# Patient Record
Sex: Female | Born: 1940 | Race: White | Hispanic: No | Marital: Married | Smoking: Former smoker
Health system: Southern US, Community
[De-identification: ages and names within clinical notes are randomized; demographics above are authoritative.]

## PROBLEM LIST (undated history)

## (undated) DIAGNOSIS — H3530 Unspecified macular degeneration: Secondary | ICD-10-CM

## (undated) DIAGNOSIS — Z853 Personal history of malignant neoplasm of breast: Secondary | ICD-10-CM

## (undated) DIAGNOSIS — E039 Hypothyroidism, unspecified: Secondary | ICD-10-CM

## (undated) DIAGNOSIS — I341 Nonrheumatic mitral (valve) prolapse: Secondary | ICD-10-CM

## (undated) DIAGNOSIS — N189 Chronic kidney disease, unspecified: Secondary | ICD-10-CM

## (undated) DIAGNOSIS — J449 Chronic obstructive pulmonary disease, unspecified: Secondary | ICD-10-CM

## (undated) DIAGNOSIS — I10 Essential (primary) hypertension: Secondary | ICD-10-CM

## (undated) DIAGNOSIS — R112 Nausea with vomiting, unspecified: Secondary | ICD-10-CM

## (undated) DIAGNOSIS — M1990 Unspecified osteoarthritis, unspecified site: Secondary | ICD-10-CM

## (undated) DIAGNOSIS — M419 Scoliosis, unspecified: Secondary | ICD-10-CM

## (undated) DIAGNOSIS — M8580 Other specified disorders of bone density and structure, unspecified site: Secondary | ICD-10-CM

## (undated) DIAGNOSIS — F319 Bipolar disorder, unspecified: Secondary | ICD-10-CM

## (undated) DIAGNOSIS — Z9889 Other specified postprocedural states: Secondary | ICD-10-CM

## (undated) DIAGNOSIS — C50919 Malignant neoplasm of unspecified site of unspecified female breast: Secondary | ICD-10-CM

## (undated) DIAGNOSIS — F419 Anxiety disorder, unspecified: Secondary | ICD-10-CM

## (undated) DIAGNOSIS — M5430 Sciatica, unspecified side: Secondary | ICD-10-CM

## (undated) DIAGNOSIS — J301 Allergic rhinitis due to pollen: Secondary | ICD-10-CM

## (undated) DIAGNOSIS — S42301A Unspecified fracture of shaft of humerus, right arm, initial encounter for closed fracture: Secondary | ICD-10-CM

## (undated) HISTORY — PX: OTHER SURGICAL HISTORY: SHX169

## (undated) HISTORY — DX: Malignant neoplasm of unspecified site of unspecified female breast: C50.919

## (undated) HISTORY — PX: HERNIA REPAIR: SHX51

## (undated) HISTORY — PX: DILATION AND CURETTAGE OF UTERUS: SHX78

## (undated) HISTORY — PX: CATARACT EXTRACTION: SUR2

## (undated) HISTORY — PX: BREAST LUMPECTOMY: SHX2

## (undated) HISTORY — PX: LAPAROTOMY: SHX154

## (undated) HISTORY — PX: COLONOSCOPY: SHX174

## (undated) HISTORY — PX: BREAST BIOPSY: SHX20

## (undated) HISTORY — PX: TONSILLECTOMY: SUR1361

## (undated) HISTORY — PX: WISDOM TOOTH EXTRACTION: SHX21

## (undated) HISTORY — DX: Bipolar disorder, unspecified: F31.9

## (undated) HISTORY — PX: UPPER GI ENDOSCOPY: SHX6162

## (undated) HISTORY — DX: Essential (primary) hypertension: I10

## (undated) HISTORY — PX: TUBAL LIGATION: SHX77

## (undated) HISTORY — PX: EYE SURGERY: SHX253

## (undated) HISTORY — PX: UNILATERAL SALPINGECTOMY: SHX6160

## (undated) HISTORY — PX: APPENDECTOMY: SHX54

---
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
## 1999-02-25 ENCOUNTER — Encounter: Payer: Self-pay | Admitting: Orthopedic Surgery

## 1999-02-25 ENCOUNTER — Encounter: Payer: Self-pay | Admitting: Emergency Medicine

## 1999-02-25 ENCOUNTER — Emergency Department (HOSPITAL_COMMUNITY): Admission: EM | Admit: 1999-02-25 | Discharge: 1999-02-25 | Payer: Self-pay | Admitting: Emergency Medicine

## 1999-08-10 ENCOUNTER — Ambulatory Visit (HOSPITAL_COMMUNITY): Admission: RE | Admit: 1999-08-10 | Discharge: 1999-08-10 | Payer: Self-pay | Admitting: Orthopedic Surgery

## 1999-08-10 ENCOUNTER — Encounter: Payer: Self-pay | Admitting: Orthopedic Surgery

## 1999-09-11 ENCOUNTER — Encounter: Payer: Self-pay | Admitting: Orthopedic Surgery

## 1999-09-18 ENCOUNTER — Inpatient Hospital Stay (HOSPITAL_COMMUNITY): Admission: RE | Admit: 1999-09-18 | Discharge: 1999-09-22 | Payer: Self-pay | Admitting: Orthopedic Surgery

## 1999-09-18 ENCOUNTER — Encounter: Payer: Self-pay | Admitting: Orthopedic Surgery

## 1999-09-18 ENCOUNTER — Encounter (INDEPENDENT_AMBULATORY_CARE_PROVIDER_SITE_OTHER): Payer: Self-pay | Admitting: Specialist

## 2000-04-07 ENCOUNTER — Other Ambulatory Visit: Admission: RE | Admit: 2000-04-07 | Discharge: 2000-04-07 | Payer: Self-pay | Admitting: Obstetrics and Gynecology

## 2001-01-20 ENCOUNTER — Ambulatory Visit (HOSPITAL_COMMUNITY): Admission: RE | Admit: 2001-01-20 | Discharge: 2001-01-20 | Payer: Self-pay | Admitting: Obstetrics and Gynecology

## 2001-01-20 ENCOUNTER — Encounter (INDEPENDENT_AMBULATORY_CARE_PROVIDER_SITE_OTHER): Payer: Self-pay | Admitting: Specialist

## 2002-07-15 ENCOUNTER — Other Ambulatory Visit: Admission: RE | Admit: 2002-07-15 | Discharge: 2002-07-15 | Payer: Self-pay | Admitting: Obstetrics and Gynecology

## 2002-09-10 ENCOUNTER — Encounter: Payer: Self-pay | Admitting: Obstetrics and Gynecology

## 2002-09-10 ENCOUNTER — Encounter: Admission: RE | Admit: 2002-09-10 | Discharge: 2002-09-10 | Payer: Self-pay | Admitting: Obstetrics and Gynecology

## 2003-09-12 ENCOUNTER — Encounter: Payer: Self-pay | Admitting: Obstetrics and Gynecology

## 2003-09-12 ENCOUNTER — Encounter: Admission: RE | Admit: 2003-09-12 | Discharge: 2003-09-12 | Payer: Self-pay | Admitting: Obstetrics and Gynecology

## 2003-09-14 ENCOUNTER — Other Ambulatory Visit: Admission: RE | Admit: 2003-09-14 | Discharge: 2003-09-14 | Payer: Self-pay | Admitting: Obstetrics and Gynecology

## 2004-09-20 ENCOUNTER — Encounter: Admission: RE | Admit: 2004-09-20 | Discharge: 2004-09-20 | Payer: Self-pay | Admitting: Obstetrics and Gynecology

## 2005-03-27 ENCOUNTER — Ambulatory Visit: Payer: Self-pay | Admitting: Gastroenterology

## 2005-05-03 ENCOUNTER — Ambulatory Visit: Payer: Self-pay | Admitting: Gastroenterology

## 2005-10-04 ENCOUNTER — Encounter: Admission: RE | Admit: 2005-10-04 | Discharge: 2005-10-04 | Payer: Self-pay | Admitting: Internal Medicine

## 2006-01-08 ENCOUNTER — Other Ambulatory Visit: Admission: RE | Admit: 2006-01-08 | Discharge: 2006-01-08 | Payer: Self-pay | Admitting: Obstetrics and Gynecology

## 2006-10-29 ENCOUNTER — Encounter: Admission: RE | Admit: 2006-10-29 | Discharge: 2006-10-29 | Payer: Self-pay | Admitting: Obstetrics and Gynecology

## 2007-06-24 ENCOUNTER — Encounter: Admission: RE | Admit: 2007-06-24 | Discharge: 2007-06-24 | Payer: Self-pay | Admitting: General Surgery

## 2007-06-30 ENCOUNTER — Ambulatory Visit (HOSPITAL_BASED_OUTPATIENT_CLINIC_OR_DEPARTMENT_OTHER): Admission: RE | Admit: 2007-06-30 | Discharge: 2007-06-30 | Payer: Self-pay | Admitting: General Surgery

## 2008-01-21 ENCOUNTER — Encounter: Admission: RE | Admit: 2008-01-21 | Discharge: 2008-01-21 | Payer: Self-pay | Admitting: Obstetrics and Gynecology

## 2009-02-01 ENCOUNTER — Encounter: Admission: RE | Admit: 2009-02-01 | Discharge: 2009-02-01 | Payer: Self-pay | Admitting: Obstetrics and Gynecology

## 2010-03-21 ENCOUNTER — Encounter: Admission: RE | Admit: 2010-03-21 | Discharge: 2010-03-21 | Payer: Self-pay | Admitting: Obstetrics and Gynecology

## 2010-03-23 ENCOUNTER — Encounter: Admission: RE | Admit: 2010-03-23 | Discharge: 2010-03-23 | Payer: Self-pay | Admitting: Obstetrics and Gynecology

## 2010-03-23 DIAGNOSIS — C50919 Malignant neoplasm of unspecified site of unspecified female breast: Secondary | ICD-10-CM

## 2010-03-23 HISTORY — DX: Malignant neoplasm of unspecified site of unspecified female breast: C50.919

## 2010-03-27 DIAGNOSIS — C50919 Malignant neoplasm of unspecified site of unspecified female breast: Secondary | ICD-10-CM | POA: Insufficient documentation

## 2010-04-02 ENCOUNTER — Encounter: Admission: RE | Admit: 2010-04-02 | Discharge: 2010-04-02 | Payer: Self-pay | Admitting: Obstetrics and Gynecology

## 2010-04-09 ENCOUNTER — Encounter: Admission: RE | Admit: 2010-04-09 | Discharge: 2010-04-09 | Payer: Self-pay | Admitting: Surgery

## 2010-04-11 ENCOUNTER — Ambulatory Visit (HOSPITAL_BASED_OUTPATIENT_CLINIC_OR_DEPARTMENT_OTHER): Admission: RE | Admit: 2010-04-11 | Discharge: 2010-04-11 | Payer: Self-pay | Admitting: Surgery

## 2010-04-11 ENCOUNTER — Encounter: Admission: RE | Admit: 2010-04-11 | Discharge: 2010-04-11 | Payer: Self-pay | Admitting: Surgery

## 2010-04-18 ENCOUNTER — Ambulatory Visit: Payer: Self-pay | Admitting: Oncology

## 2010-05-02 LAB — COMPREHENSIVE METABOLIC PANEL
AST: 29 U/L (ref 0–37)
CO2: 26 mEq/L (ref 19–32)
Creatinine, Ser: 1.61 mg/dL — ABNORMAL HIGH (ref 0.40–1.20)
Potassium: 4.5 mEq/L (ref 3.5–5.3)
Total Protein: 6.7 g/dL (ref 6.0–8.3)

## 2010-05-02 LAB — CBC WITH DIFFERENTIAL/PLATELET
BASO%: 0.6 % (ref 0.0–2.0)
EOS%: 3 % (ref 0.0–7.0)
Eosinophils Absolute: 0.2 10*3/uL (ref 0.0–0.5)
HCT: 38.8 % (ref 34.8–46.6)
LYMPH%: 28 % (ref 14.0–49.7)
MONO#: 0.8 10*3/uL (ref 0.1–0.9)
NEUT%: 55.5 % (ref 38.4–76.8)
RBC: 4.32 10*6/uL (ref 3.70–5.45)
RDW: 13.3 % (ref 11.2–14.5)
lymph#: 1.7 10*3/uL (ref 0.9–3.3)

## 2010-05-03 ENCOUNTER — Ambulatory Visit: Admission: RE | Admit: 2010-05-03 | Discharge: 2010-06-18 | Payer: Self-pay | Admitting: Radiation Oncology

## 2010-07-02 ENCOUNTER — Ambulatory Visit: Payer: Self-pay | Admitting: Oncology

## 2010-07-04 LAB — CBC WITH DIFFERENTIAL/PLATELET
BASO%: 0.8 % (ref 0.0–2.0)
EOS%: 3.2 % (ref 0.0–7.0)
HCT: 35.8 % (ref 34.8–46.6)
MCH: 30.6 pg (ref 25.1–34.0)
MCV: 90.2 fL (ref 79.5–101.0)
MONO%: 16.8 % — ABNORMAL HIGH (ref 0.0–14.0)
NEUT#: 2.5 10*3/uL (ref 1.5–6.5)
NEUT%: 55.5 % (ref 38.4–76.8)
Platelets: 208 10*3/uL (ref 145–400)
WBC: 4.6 10*3/uL (ref 3.9–10.3)
lymph#: 1.1 10*3/uL (ref 0.9–3.3)

## 2010-07-04 LAB — COMPREHENSIVE METABOLIC PANEL
ALT: 28 U/L (ref 0–35)
AST: 32 U/L (ref 0–37)
Albumin: 4.2 g/dL (ref 3.5–5.2)
BUN: 30 mg/dL — ABNORMAL HIGH (ref 6–23)
Chloride: 107 mEq/L (ref 96–112)
Creatinine, Ser: 1.59 mg/dL — ABNORMAL HIGH (ref 0.40–1.20)
Potassium: 4.6 mEq/L (ref 3.5–5.3)
Sodium: 142 mEq/L (ref 135–145)
Total Bilirubin: 0.4 mg/dL (ref 0.3–1.2)
Total Protein: 6.8 g/dL (ref 6.0–8.3)

## 2010-10-11 ENCOUNTER — Ambulatory Visit: Payer: Self-pay | Admitting: Oncology

## 2010-10-15 LAB — CBC WITH DIFFERENTIAL/PLATELET
Basophils Absolute: 0 10*3/uL (ref 0.0–0.1)
EOS%: 2.8 % (ref 0.0–7.0)
HCT: 37.8 % (ref 34.8–46.6)
HGB: 12.4 g/dL (ref 11.6–15.9)
LYMPH%: 20.7 % (ref 14.0–49.7)
MCH: 29.7 pg (ref 25.1–34.0)
MCHC: 32.9 g/dL (ref 31.5–36.0)
MONO#: 0.7 10*3/uL (ref 0.1–0.9)
MONO%: 12.5 % (ref 0.0–14.0)
NEUT%: 63.4 % (ref 38.4–76.8)
Platelets: 194 10*3/uL (ref 145–400)
RDW: 12.9 % (ref 11.2–14.5)

## 2010-10-15 LAB — COMPREHENSIVE METABOLIC PANEL
ALT: 20 U/L (ref 0–35)
CO2: 25 mEq/L (ref 19–32)
Creatinine, Ser: 1.58 mg/dL — ABNORMAL HIGH (ref 0.40–1.20)
Glucose, Bld: 90 mg/dL (ref 70–99)
Total Protein: 6.5 g/dL (ref 6.0–8.3)

## 2010-12-19 DIAGNOSIS — M545 Low back pain, unspecified: Secondary | ICD-10-CM | POA: Insufficient documentation

## 2011-01-08 ENCOUNTER — Ambulatory Visit: Payer: Self-pay | Admitting: Oncology

## 2011-01-10 LAB — CBC WITH DIFFERENTIAL/PLATELET
BASO%: 0.5 % (ref 0.0–2.0)
Basophils Absolute: 0 10*3/uL (ref 0.0–0.1)
EOS%: 3 % (ref 0.0–7.0)
Eosinophils Absolute: 0.1 10*3/uL (ref 0.0–0.5)
HCT: 37.7 % (ref 34.8–46.6)
HGB: 12.9 g/dL (ref 11.6–15.9)
LYMPH%: 26.5 % (ref 14.0–49.7)
MCH: 30.9 pg (ref 25.1–34.0)
MCHC: 34.3 g/dL (ref 31.5–36.0)
MCV: 90.2 fL (ref 79.5–101.0)
MONO#: 0.7 10*3/uL (ref 0.1–0.9)
MONO%: 13.5 % (ref 0.0–14.0)
NEUT#: 2.8 10*3/uL (ref 1.5–6.5)
NEUT%: 56.5 % (ref 38.4–76.8)
Platelets: 202 10*3/uL (ref 145–400)
RBC: 4.18 10*6/uL (ref 3.70–5.45)
RDW: 13 % (ref 11.2–14.5)
WBC: 5 10*3/uL (ref 3.9–10.3)
lymph#: 1.3 10*3/uL (ref 0.9–3.3)

## 2011-01-10 LAB — COMPREHENSIVE METABOLIC PANEL
ALT: 22 U/L (ref 0–35)
AST: 24 U/L (ref 0–37)
Albumin: 4 g/dL (ref 3.5–5.2)
Alkaline Phosphatase: 48 U/L (ref 39–117)
BUN: 30 mg/dL — ABNORMAL HIGH (ref 6–23)
CO2: 26 mEq/L (ref 19–32)
Calcium: 8.9 mg/dL (ref 8.4–10.5)
Chloride: 106 mEq/L (ref 96–112)
Creatinine, Ser: 1.71 mg/dL — ABNORMAL HIGH (ref 0.40–1.20)
Glucose, Bld: 89 mg/dL (ref 70–99)
Potassium: 4.4 mEq/L (ref 3.5–5.3)
Sodium: 143 mEq/L (ref 135–145)
Total Bilirubin: 0.5 mg/dL (ref 0.3–1.2)
Total Protein: 6.3 g/dL (ref 6.0–8.3)

## 2011-02-22 ENCOUNTER — Other Ambulatory Visit: Payer: Self-pay | Admitting: Obstetrics and Gynecology

## 2011-02-22 DIAGNOSIS — Z853 Personal history of malignant neoplasm of breast: Secondary | ICD-10-CM

## 2011-03-12 LAB — CBC
HCT: 39.3 % (ref 36.0–46.0)
MCHC: 34.5 g/dL (ref 30.0–36.0)
MCV: 90.3 fL (ref 78.0–100.0)
Platelets: 193 10*3/uL (ref 150–400)
RBC: 4.35 MIL/uL (ref 3.87–5.11)
WBC: 6.9 10*3/uL (ref 4.0–10.5)

## 2011-03-12 LAB — DIFFERENTIAL
Eosinophils Relative: 2 % (ref 0–5)
Lymphocytes Relative: 25 % (ref 12–46)
Lymphs Abs: 1.7 10*3/uL (ref 0.7–4.0)
Monocytes Relative: 11 % (ref 3–12)
Neutro Abs: 4.2 10*3/uL (ref 1.7–7.7)
Neutrophils Relative %: 61 % (ref 43–77)

## 2011-03-12 LAB — COMPREHENSIVE METABOLIC PANEL
ALT: 30 U/L (ref 0–35)
AST: 33 U/L (ref 0–37)
Alkaline Phosphatase: 52 U/L (ref 39–117)
Calcium: 9.2 mg/dL (ref 8.4–10.5)
Glucose, Bld: 99 mg/dL (ref 70–99)
Potassium: 5.4 mEq/L — ABNORMAL HIGH (ref 3.5–5.1)
Total Bilirubin: 0.8 mg/dL (ref 0.3–1.2)
Total Protein: 7.1 g/dL (ref 6.0–8.3)

## 2011-03-12 LAB — URINALYSIS, ROUTINE W REFLEX MICROSCOPIC
Nitrite: NEGATIVE
pH: 6 (ref 5.0–8.0)

## 2011-03-12 LAB — CANCER ANTIGEN 27.29: CA 27.29: 22 U/mL (ref 0–39)

## 2011-04-10 ENCOUNTER — Ambulatory Visit
Admission: RE | Admit: 2011-04-10 | Discharge: 2011-04-10 | Disposition: A | Discharge status: Home / Self Care | Payer: Medicare Other | Source: Ambulatory Visit | Attending: Obstetrics and Gynecology | Admitting: Obstetrics and Gynecology

## 2011-04-10 DIAGNOSIS — Z853 Personal history of malignant neoplasm of breast: Secondary | ICD-10-CM

## 2011-05-07 NOTE — Op Note (Signed)
Shelly [name]                 ACCOUNT NO.:  [id number]   MEDICAL RECORD NO.:  [id number]          PATIENT TYPE:  AMB   LOCATION:  [place]                          FACILITY:  [facility]   PHYSICIAN:  Odis Hollingshead, M.D.DATE OF BIRTH:  [birth year]   DATE OF PROCEDURE:  06/30/2007  DATE OF DISCHARGE:                               OPERATIVE REPORT   PREOPERATIVE DIAGNOSIS:  Right inguinal hernia.   POSTOPERATIVE DIAGNOSIS:  Right inguinal hernia.   PROCEDURE:  Right inguinal hernia repaired with mesh.   SURGEON:  Odis Hollingshead, M.D.   ANESTHESIA:  General plus Marcaine for local effect.   INDICATION:  Shelly [name] is a 70 year old female who has had a  Pfannenstiel incision in the past.  On the right lateral aspect, she  states she has noticed a bulge that is getting larger and sometimes  sore.  On examination, it looks like it could be an inguinal hernia or  possible a lateral incisional hernia, and now she is brought to the  operating room for repair.   TECHNIQUE:  She was seen in the holding area and the area marked with my  initials.  She was then brought to the operating room, placed supine on  the operating table and general anesthetic was administered.  A Foley  catheter was placed in the bladder.  The lower abdominal wall and pelvic  area were sterilely prepped and draped.  I began at the midportion of  her Pfannenstiel scar and made an incision extending this to the right  and dividing the skin and subcutaneous tissue.  I inspected the fascia  of the previous Pfannenstiel closure and appeared to be intact.  I  noticed a weakness in external oblique aponeurosis/fullness.  I  infiltrated with local anesthetic deep to the external oblique  aponeurosis and made an incision in it through the external ring  medially and toward the anterior superior iliac spine laterally.  I  isolated the round ligament and encircled it and noted an indirect  hernia sac with contents and an  enlarged internal ring.  I dissected the  indirect sac and contents free from the round ligament and replaced it  through the enlarged internal ring.  This was consistent with an  indirect inguinal hernia.   Following this, I brought a piece of 3 x 6 inch polypropylene mesh into  the field and anchored it 2 cm medial to the pubic tubercle with 2-0  Prolene suture.  The inferior aspect of the mesh was anchored the  shelving edge of the inguinal ligament with a running 2-0 Prolene suture  to a level 2 cm lateral to the internal ring.  A slit was cut in the  mesh, and the two tails were wrapped around the round ligament and the  ilioinguinal nerve.  The superior aspect of the mesh was anchored to the  internal oblique aponeurosis with interrupted 2-0 Vicryl sutures.  The  two tails were crossed creating a new internal ring.  These were then  anchored to the shelving edge of the inguinal ligament with a 2-0  Prolene suture.   Following this, I injected the internal oblique aponeurosis with  Marcaine solution.  I then injected the subcutaneous tissue with  Marcaine solution.  I tucked the lateral aspect of the mesh deep to the  external oblique aponeurosis and then closed it with running 3-0 Vicryl  suture over the mesh and round ligament.  The subcutaneous tissue was  closed with a running 3-0 Vicryl suture.  The skin was closed with a 4-0  Monocryl subcuticular stitch.  Steri-Strips and a sterile dressing were  applied.   She tolerated the procedure well without apparent complications and was  taken to the recovery in satisfactory condition.  She will be given  discharge instructions and Tylox for pain and follow-up in office in 2  to 3 weeks.      Odis Hollingshead, M.D.  Electronically Signed     TJR/MEDQ  D:  06/30/2007  T:  06/30/2007  Job:  [id number]   cc:   Sharyn Lull L. Helane Rima, M.D.  [name], MD

## 2011-05-10 NOTE — H&P (Signed)
[facility]  Patient:    Shelly [name], Shelly [name]                          MRN: [id number] Dictator:   Harrold Donath, M.D.                         History and Physical  HISTORY OF PRESENT ILLNESS:   This is a 70 year old female admitted to the hospital for a hysteroscopy, D&C, possible resection.  This patient is on hormone replacement therapy; she is on Vivelle 0.375 and Provera 1-10.  She has had some abnormal spotting and she had had marked breast tenderness.  We have reduced her from Premarin which she was taking to the [place] with some decrease in her breast tenderness, but she has some spotting which she has had with her last real regular period with hormones in April 2000.  Because of this, she had an ultrasound which showed her uterus to be 5.6 x 3.2 x 4.2 cm which was retroflexed.  She had a fundal myoma.  She also had a 15.6 mm thickness of the endometrium with an echogenic area within the cavity of 6 x 5 mm.  We injected sterile saline and outlined a 7 x 9 cm area at the left wall of the cavity which was hard to evaluate.  The patient had cramping back on the 10th and 11th day of her Provera.  She thinks she had a faint trace of blood in January, but mainly she had the cramping.  Her ovaries are 2.1 x 1.3 x 1.9 with a 3 mm echogenic area in the wall of the ovary; her left ovary is 2.2 x 1 x 1.5 with an echo-free cyst.  The patient because of this is admitted for surgical evaluation.  Her last Pap smear was April 2001, which was normal.  Last mammogram was April 2001.  MEDICATIONS:                  Synthroid 0.05 everyday, Wellbutrin 100 mg every day, Eskalith which is lithium 450 mg everyday and Xanax which she takes p.r.n. for sleep.  HABITS:                       Her caffeine is limited.  OTHER MEDICAL DOCTORS:        Her current doctors are Dr. Burna Sis, Dr. Maxwell Caul and Dr. Erling Cruz.  REVIEW OF SYSTEMS:            She has had problems with a  weight gain but not markedly.  She has problems with her vision, she has the eyes checked regularly, which may be medication.  She has no problems with her cardiovascular or lungs.  Gastrointestinal: She thinks her medicine makes her constipated.  She has had no other major problems.  There is no history of breast, colon or ovarian cancer in the family.  She has had kidney stones in the past.  She is currently being treated for depression and she has been anemic in the past.  She is also currently taking thyroid.  PHYSICAL EXAMINATION:  GENERAL:                      This is a well-developed, well-nourished female oriented and alert.  VITAL SIGNS:  Blood pressure is 105/58, weight is 134.  HEART:                        Normal sinus rhythm without gallops or murmurs.  LUNGS:                        Clear to auscultation and percussion.  BREASTS:                      Type IV without masses.  ABDOMEN:                      The liver is not enlarged, spleen is not enlarged.  VAGINAL EXAM:                 She has good support, she has some estrogen effect.  The cervix is epithelialized.  The uterus feels normal size.  I feel no enlargement of the ovaries.  IMPRESSION:                   1. Postmenopausal spotting.                               2. Postmenopausal cramping.                               3. Echogenic defect, endometrial cavity.                               4. Hormone replacement therapy.                               5. Mastodynia decreasing.                               6. On lithium, on thyroid and on Wellbutrin.  DISPOSITION:                  Admit for hysteroscopy and D&C. DD:  01/19/01 TD:  01/19/01 Job: [id number]

## 2011-06-06 ENCOUNTER — Other Ambulatory Visit: Payer: Self-pay | Admitting: Oncology

## 2011-06-06 ENCOUNTER — Encounter (HOSPITAL_BASED_OUTPATIENT_CLINIC_OR_DEPARTMENT_OTHER): Payer: Medicare Other | Admitting: Oncology

## 2011-06-06 DIAGNOSIS — C50419 Malignant neoplasm of upper-outer quadrant of unspecified female breast: Secondary | ICD-10-CM

## 2011-06-06 DIAGNOSIS — I10 Essential (primary) hypertension: Secondary | ICD-10-CM

## 2011-06-06 DIAGNOSIS — Z170 Estrogen receptor positive status [ER+]: Secondary | ICD-10-CM

## 2011-06-06 DIAGNOSIS — N189 Chronic kidney disease, unspecified: Secondary | ICD-10-CM

## 2011-06-06 LAB — CBC WITH DIFFERENTIAL/PLATELET
Basophils Absolute: 0.1 10*3/uL (ref 0.0–0.1)
EOS%: 2.6 % (ref 0.0–7.0)
HCT: 35.6 % (ref 34.8–46.6)
HGB: 11.9 g/dL (ref 11.6–15.9)
MCH: 30.3 pg (ref 25.1–34.0)
MONO#: 0.8 10*3/uL (ref 0.1–0.9)
NEUT#: 3.9 10*3/uL (ref 1.5–6.5)
NEUT%: 61.1 % (ref 38.4–76.8)
RDW: 13.1 % (ref 11.2–14.5)
WBC: 6.5 10*3/uL (ref 3.9–10.3)
lymph#: 1.5 10*3/uL (ref 0.9–3.3)

## 2011-06-06 LAB — COMPREHENSIVE METABOLIC PANEL
ALT: 27 U/L (ref 0–35)
Albumin: 3.9 g/dL (ref 3.5–5.2)
Alkaline Phosphatase: 53 U/L (ref 39–117)
Calcium: 8.9 mg/dL (ref 8.4–10.5)
Creatinine, Ser: 1.67 mg/dL — ABNORMAL HIGH (ref 0.50–1.10)
Potassium: 4.6 mEq/L (ref 3.5–5.3)
Total Protein: 6.2 g/dL (ref 6.0–8.3)

## 2011-06-11 ENCOUNTER — Other Ambulatory Visit: Payer: Self-pay | Admitting: Dermatology

## 2011-10-08 LAB — COMPREHENSIVE METABOLIC PANEL
ALT: 28
Alkaline Phosphatase: 33 — ABNORMAL LOW
BUN: 25 — ABNORMAL HIGH
CO2: 27
GFR calc non Af Amer: 34 — ABNORMAL LOW
Glucose, Bld: 83
Potassium: 4.3
Sodium: 139

## 2011-10-08 LAB — URINALYSIS, ROUTINE W REFLEX MICROSCOPIC
Nitrite: NEGATIVE
Protein, ur: NEGATIVE
Urobilinogen, UA: 0.2

## 2011-10-08 LAB — CBC
HCT: 36.6
Hemoglobin: 12.2
MCHC: 33.2
RBC: 4.01

## 2011-10-08 LAB — POCT HEMOGLOBIN-HEMACUE
Hemoglobin: 12.3
Operator id: 116011

## 2011-10-08 LAB — DIFFERENTIAL
Basophils Absolute: 0
Basophils Relative: 1
Eosinophils Absolute: 0.2
Neutro Abs: 4.7
Neutrophils Relative %: 62

## 2011-10-24 ENCOUNTER — Ambulatory Visit (HOSPITAL_BASED_OUTPATIENT_CLINIC_OR_DEPARTMENT_OTHER): Payer: Medicare Other | Admitting: Oncology

## 2011-10-24 ENCOUNTER — Other Ambulatory Visit: Payer: Self-pay | Admitting: Oncology

## 2011-10-24 ENCOUNTER — Telehealth: Payer: Self-pay | Admitting: Oncology

## 2011-10-24 DIAGNOSIS — Z853 Personal history of malignant neoplasm of breast: Secondary | ICD-10-CM

## 2011-10-24 DIAGNOSIS — Z170 Estrogen receptor positive status [ER+]: Secondary | ICD-10-CM

## 2011-10-24 DIAGNOSIS — I10 Essential (primary) hypertension: Secondary | ICD-10-CM

## 2011-10-24 DIAGNOSIS — C50419 Malignant neoplasm of upper-outer quadrant of unspecified female breast: Secondary | ICD-10-CM

## 2011-10-24 DIAGNOSIS — C50919 Malignant neoplasm of unspecified site of unspecified female breast: Secondary | ICD-10-CM

## 2011-10-24 DIAGNOSIS — N189 Chronic kidney disease, unspecified: Secondary | ICD-10-CM

## 2011-10-24 LAB — CBC WITH DIFFERENTIAL/PLATELET
Eosinophils Absolute: 0.2 10*3/uL (ref 0.0–0.5)
MONO#: 0.7 10*3/uL (ref 0.1–0.9)
MONO%: 14.7 % — ABNORMAL HIGH (ref 0.0–14.0)
NEUT#: 2.5 10*3/uL (ref 1.5–6.5)
RBC: 4.06 10*6/uL (ref 3.70–5.45)
RDW: 13.1 % (ref 11.2–14.5)
WBC: 4.6 10*3/uL (ref 3.9–10.3)
lymph#: 1.2 10*3/uL (ref 0.9–3.3)

## 2011-10-24 LAB — COMPREHENSIVE METABOLIC PANEL
Albumin: 3.9 g/dL (ref 3.5–5.2)
Alkaline Phosphatase: 48 U/L (ref 39–117)
Chloride: 107 mEq/L (ref 96–112)
Glucose, Bld: 80 mg/dL (ref 70–99)
Potassium: 4.6 mEq/L (ref 3.5–5.3)
Sodium: 143 mEq/L (ref 135–145)
Total Protein: 6.3 g/dL (ref 6.0–8.3)

## 2011-10-24 NOTE — Telephone Encounter (Signed)
gv pt appt schedule for April. Per 11/1 pof lb/HH 31mos.

## 2012-02-10 ENCOUNTER — Other Ambulatory Visit: Payer: Self-pay | Admitting: Oncology

## 2012-02-20 DIAGNOSIS — H35371 Puckering of macula, right eye: Secondary | ICD-10-CM | POA: Insufficient documentation

## 2012-02-20 DIAGNOSIS — H353132 Nonexudative age-related macular degeneration, bilateral, intermediate dry stage: Secondary | ICD-10-CM | POA: Insufficient documentation

## 2012-03-04 ENCOUNTER — Other Ambulatory Visit: Payer: Self-pay | Admitting: Oncology

## 2012-03-04 DIAGNOSIS — Z853 Personal history of malignant neoplasm of breast: Secondary | ICD-10-CM

## 2012-03-15 ENCOUNTER — Encounter: Payer: Self-pay | Admitting: Oncology

## 2012-03-26 ENCOUNTER — Other Ambulatory Visit (HOSPITAL_BASED_OUTPATIENT_CLINIC_OR_DEPARTMENT_OTHER): Payer: Medicare Other | Admitting: Lab

## 2012-03-26 ENCOUNTER — Ambulatory Visit (HOSPITAL_BASED_OUTPATIENT_CLINIC_OR_DEPARTMENT_OTHER): Payer: Medicare Other | Admitting: Oncology

## 2012-03-26 ENCOUNTER — Telehealth: Payer: Self-pay | Admitting: Oncology

## 2012-03-26 VITALS — BP 137/76 | HR 78 | Temp 97.0°F | Ht 64.5 in | Wt 114.7 lb

## 2012-03-26 DIAGNOSIS — M810 Age-related osteoporosis without current pathological fracture: Secondary | ICD-10-CM

## 2012-03-26 DIAGNOSIS — M899 Disorder of bone, unspecified: Secondary | ICD-10-CM

## 2012-03-26 DIAGNOSIS — C50919 Malignant neoplasm of unspecified site of unspecified female breast: Secondary | ICD-10-CM

## 2012-03-26 DIAGNOSIS — C50419 Malignant neoplasm of upper-outer quadrant of unspecified female breast: Secondary | ICD-10-CM

## 2012-03-26 DIAGNOSIS — Z170 Estrogen receptor positive status [ER+]: Secondary | ICD-10-CM

## 2012-03-26 LAB — CBC WITH DIFFERENTIAL/PLATELET
EOS%: 5.2 % (ref 0.0–7.0)
Eosinophils Absolute: 0.2 10*3/uL (ref 0.0–0.5)
MCH: 30 pg (ref 25.1–34.0)
MCV: 92.1 fL (ref 79.5–101.0)
MONO%: 14.3 % — ABNORMAL HIGH (ref 0.0–14.0)
NEUT#: 2.4 10*3/uL (ref 1.5–6.5)
RBC: 4.01 10*6/uL (ref 3.70–5.45)
RDW: 13.3 % (ref 11.2–14.5)
nRBC: 0 % (ref 0–0)

## 2012-03-26 LAB — COMPREHENSIVE METABOLIC PANEL
ALT: 26 U/L (ref 0–35)
AST: 29 U/L (ref 0–37)
Alkaline Phosphatase: 44 U/L (ref 39–117)
Sodium: 141 mEq/L (ref 135–145)
Total Bilirubin: 0.4 mg/dL (ref 0.3–1.2)
Total Protein: 6.4 g/dL (ref 6.0–8.3)

## 2012-03-26 NOTE — Progress Notes (Signed)
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  She is status post left breast upper outer quadrant lumpectomy on April 11, 2010 without evidence of angiolymphatic invasion.  There was no necrosis.  The closest margin of the invasive component was 0.1cm to the posterior margin, which was pectoralis major.  ER 99%, PR 99%, Ki-67 3%, HER2/neu by CISH was negative with ratio of 1.00.  She is s/p adjuvant XRT.  CURRENT THERAPY:  started adjuvant tamoxifen in July 2011.  INTERVAL HISTORY: Shelly [name] 71 y.o. female returns for regular followup. She reported that she has been having history of pain for many years. She has been seeing a Restaurant manager, fast food.  Her left sided pain slightly improved. However she's been having worsening mid upper back pain between her shoulder blades. This pain is described as crampy, mild to moderate, resolved with over-the-counter a medication such as axial; radiates to left anterior ribs; does not associate with weakness or numbness in bilateral arms.  She still has mild hot flash with tamoxifen.  However she is able to tolerate this, and she does not want any medication for the hot flash.  She has not found any abnormal breast lesion on her self breast exam. She has mild fatigue; however, she is independent of all activities of daily living.  Patient denies headache, visual changes, confusion, drenching night sweats, palpable lymph node swelling, mucositis, odynophagia, dysphagia, nausea vomiting, jaundice, chest pain, palpitation, shortness of breath, dyspnea on exertion, productive cough, gum bleeding, epistaxis, hematemesis, hemoptysis, abdominal pain, abdominal swelling, early satiety, melena, hematochezia, hematuria, skin rash, spontaneous bleeding, joint swelling, heat or cold intolerance, bowel bladder incontinence, back pain, focal motor  weakness, paresthesia, depression, suicidal or homocidal ideation, feeling hopelessness.   Past Medical History  Diagnosis Date  . Hypertension   . Bipolar depression   . Migraine   . Breast cancer 03/2010    s/p lumpectomy; adjuvant radiation; on adjuvant homrnal therapy    No past surgical history on file.  Current Outpatient Prescriptions  Medication Sig Dispense Refill  . amLODipine (NORVASC) 2.5 MG tablet Take 2.5 mg by mouth daily.      Marland Kitchen buPROPion (WELLBUTRIN SR) 150 MG 12 hr tablet Take 150 mg by mouth daily.      . cholecalciferol (VITAMIN D) 1000 UNITS tablet Take 1,000 Units by mouth daily.      Marland Kitchen levothyroxine (SYNTHROID, LEVOTHROID) 75 MCG tablet Take 75 mcg by mouth daily.      . Multiple Vitamins-Minerals (MULTIVITAMIN WITH MINERALS) tablet Take 1 tablet by mouth daily.      . Multiple Vitamins-Minerals (PRESERVISION AREDS 2 PO) Take by mouth daily.      . naproxen (NAPROSYN) 250 MG tablet Take 250 mg by mouth as needed.      . tamoxifen (NOLVADEX) 20 MG tablet TAKE ONE TABLET EVERY DAY  90 tablet  2    ALLERGIES:  is allergic to penicillins.  REVIEW OF SYSTEMS:  The rest of the 14-point review of system was negative.   Filed Vitals:   03/26/12 1209  BP: 137/76  Pulse: 78  Temp: 97 F (36.1 C)   Wt Readings from Last 3 Encounters:  03/26/12 114 lb 11.2 oz (52.028 kg)   ECOG Performance status: 0-1  PHYSICAL EXAMINATION:   General:  Thin appearing woman in no acute distress.  Eyes:  no  scleral icterus.  ENT:  There were no oropharyngeal lesions.  Neck was without thyromegaly.  Lymphatics:  Negative cervical, supraclavicular or axillary adenopathy.  Respiratory: lungs were clear bilaterally without wheezing or crackles.  Cardiovascular:  Regular rate and rhythm, S1/S2, without murmur, rub or gallop.  There was no pedal edema.  GI:  abdomen was soft, flat, nontender, nondistended, without organomegaly.  Muscoloskeletal:  no spinal tenderness of palpation of  vertebral spines themselves.  There was mild tenderness to palpation of left anterior ribs and mid upper back.  Skin exam was without echymosis, petichae.  Neuro exam was nonfocal.  Patient was able to get on and off exam table without assistance.  Gait was normal.  Patient was alerted and oriented.  Attention was good.   Language was appropriate.  Mood was normal without depression.  Speech was not pressured.  Thought content was not tangential.  Bilateral breast exam was negative for breast mass,        LABORATORY/RADIOLOGY DATA:  Lab Results  Component Value Date   WBC 4.7 03/26/2012   HGB 12.1 03/26/2012   HCT 37.0 03/26/2012   PLT 182 03/26/2012   GLUCOSE 87 03/26/2012   ALKPHOS 44 03/26/2012   ALT 26 03/26/2012   AST 29 03/26/2012   NA 141 03/26/2012   K 4.8 03/26/2012   CL 107 03/26/2012   CREATININE 1.64* 03/26/2012   BUN 32* 03/26/2012   CO2 26 03/26/2012    ASSESSMENT AND PLAN:   1. Bone pain:  Most likely due to musculoskeletal. She has no history of the Joint disease. She came that she had workup with her PCP x-ray that was negative. I advised her to follow with her PCP and her chiropractor to modified the exercise program to improve this back pain. I advised her that if she has a significantly worsening back pain or weakness in the arms or paresthesia significant is no for further evaluation. At this time, I still have low clinical suspicion for bone metastases (normal calcium/alkphos). 2. History of breast cancer:  I discussed with Ms. Withington that she has no evidence of disease recurrence or metastatic disease. I advised her to continue adjuvant Tamoxifen.  She tolerates tamoxifen well with grade 1 hot flash which is not warrants dose modification. Her routine surveillance mammogram is due for 04/10/2012. 3. History of osteoporosis:  She does not tolerate calcium at all and that is why she is on tamoxifen as opposed to an AI.  She only takes calcium in her nutrition with a lot of dairy products.  I  recommended for her to get the next bone density scan in October 2013 to see if there is any change in her bone density score at that time.  4. Hypothyroidism:  She is on levothyroxine per PCP.  5. Depression/anxiety:  well controlled clinically today with buproprion per PCP.  6. HTN: well controlled on Norvasc per PCP.  7. Elevated Cr:  Unclear etiology:  Her Cr today is 1.6 which has been for at least 1 year.  There is no sign of fluid overload on exam.  I defer to her PCP to decide if she needs to be referred to Nephrology.   Follow up with me in about 5 months.    The length of time of the face-to-face encounter was 15 minutes. More than 50% of time was spent counseling and coordination of care.

## 2012-03-26 NOTE — Telephone Encounter (Signed)
gve the pt her oct 2013 appt calendar °

## 2012-04-13 ENCOUNTER — Ambulatory Visit
Admission: RE | Admit: 2012-04-13 | Discharge: 2012-04-13 | Disposition: A | Discharge status: Home / Self Care | Payer: Medicare Other | Source: Ambulatory Visit | Attending: Oncology | Admitting: Oncology

## 2012-04-13 DIAGNOSIS — Z853 Personal history of malignant neoplasm of breast: Secondary | ICD-10-CM

## 2012-04-27 ENCOUNTER — Other Ambulatory Visit: Payer: Self-pay | Admitting: Internal Medicine

## 2012-04-27 DIAGNOSIS — D259 Leiomyoma of uterus, unspecified: Secondary | ICD-10-CM

## 2012-04-28 ENCOUNTER — Ambulatory Visit
Admission: RE | Admit: 2012-04-28 | Discharge: 2012-04-28 | Disposition: A | Discharge status: Home / Self Care | Payer: Medicare Other | Source: Ambulatory Visit | Attending: Internal Medicine | Admitting: Internal Medicine

## 2012-04-28 DIAGNOSIS — D259 Leiomyoma of uterus, unspecified: Secondary | ICD-10-CM

## 2012-09-25 ENCOUNTER — Other Ambulatory Visit: Payer: Medicare Other | Admitting: Lab

## 2012-09-25 ENCOUNTER — Ambulatory Visit: Payer: Medicare Other | Admitting: Oncology

## 2012-09-29 ENCOUNTER — Other Ambulatory Visit (HOSPITAL_BASED_OUTPATIENT_CLINIC_OR_DEPARTMENT_OTHER): Payer: Medicare Other | Admitting: Lab

## 2012-09-29 ENCOUNTER — Encounter: Payer: Self-pay | Admitting: Oncology

## 2012-09-29 ENCOUNTER — Ambulatory Visit (HOSPITAL_BASED_OUTPATIENT_CLINIC_OR_DEPARTMENT_OTHER): Payer: Medicare Other | Admitting: Oncology

## 2012-09-29 ENCOUNTER — Telehealth: Payer: Self-pay | Admitting: Oncology

## 2012-09-29 VITALS — BP 133/70 | HR 74 | Temp 97.5°F | Resp 18 | Ht 64.5 in | Wt 114.9 lb

## 2012-09-29 DIAGNOSIS — Z853 Personal history of malignant neoplasm of breast: Secondary | ICD-10-CM

## 2012-09-29 DIAGNOSIS — C50919 Malignant neoplasm of unspecified site of unspecified female breast: Secondary | ICD-10-CM

## 2012-09-29 DIAGNOSIS — M810 Age-related osteoporosis without current pathological fracture: Secondary | ICD-10-CM

## 2012-09-29 DIAGNOSIS — M899 Disorder of bone, unspecified: Secondary | ICD-10-CM

## 2012-09-29 DIAGNOSIS — F341 Dysthymic disorder: Secondary | ICD-10-CM

## 2012-09-29 DIAGNOSIS — C50419 Malignant neoplasm of upper-outer quadrant of unspecified female breast: Secondary | ICD-10-CM

## 2012-09-29 HISTORY — DX: Personal history of malignant neoplasm of breast: Z85.3

## 2012-09-29 LAB — CBC WITH DIFFERENTIAL/PLATELET
BASO%: 0.4 % (ref 0.0–2.0)
LYMPH%: 19.9 % (ref 14.0–49.7)
MCHC: 32.9 g/dL (ref 31.5–36.0)
MCV: 91 fL (ref 79.5–101.0)
MONO%: 10.1 % (ref 0.0–14.0)
Platelets: 188 10*3/uL (ref 145–400)
RBC: 4.34 10*6/uL (ref 3.70–5.45)

## 2012-09-29 LAB — COMPREHENSIVE METABOLIC PANEL (CC13)
ALT: 24 U/L (ref 0–55)
Alkaline Phosphatase: 47 U/L (ref 40–150)
Creatinine: 1.6 mg/dL — ABNORMAL HIGH (ref 0.6–1.1)
Glucose: 85 mg/dl (ref 70–99)
Sodium: 142 mEq/L (ref 136–145)
Total Bilirubin: 0.4 mg/dL (ref 0.20–1.20)
Total Protein: 6.7 g/dL (ref 6.4–8.3)

## 2012-09-29 MED ORDER — TAMOXIFEN CITRATE 20 MG PO TABS: 20.0000 mg | ORAL_TABLET | Freq: Every day | ORAL | Status: DC

## 2012-09-29 NOTE — Progress Notes (Signed)
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  She is status post left breast upper outer quadrant lumpectomy on April 11, 2010 without evidence of angiolymphatic invasion.  There was no necrosis.  The closest margin of the invasive component was 0.1cm to the posterior margin, which was pectoralis major.  ER 99%, PR 99%, Ki-67 3%, HER2/neu by CISH was negative with ratio of 1.00.  She is s/p adjuvant XRT.  CURRENT THERAPY:  started adjuvant tamoxifen in July 2011.  INTERVAL HISTORY: Shelly [name] 71 y.o. female returns for regular followup. She reported that she has been having history of back pain for many years. She has been seeing a Restaurant manager, fast food.  Her left sided pain slightly improved. However she's been having worsening mid upper back pain between her shoulder blades. This pain is described as crampy, mild to moderate, resolved with over-the-counter a medication such as axial; radiates to left anterior ribs; does not associate with weakness or numbness in bilateral arms.  MRI was obtained and per her reports showed some thickening of her endometrium. She was seen by gynecology and had an ultrasound. A biopsy was performed without any malignancy identified. A follow-up ultrasound was performed at the end of September with decreased thickening of the lining. Biopsy was not repeated. She is due to follow-up in 6 months. She still has mild hot flash with tamoxifen.  However she is able to tolerate this, and she does not want any medication for the hot flash.  She has not found any abnormal breast lesion on her self breast exam. She has mild fatigue; however, she is independent of all activities of daily living.  Patient denies headache, visual changes, confusion, drenching night sweats, palpable lymph node swelling, mucositis, odynophagia, dysphagia, nausea vomiting,  jaundice, chest pain, palpitation, shortness of breath, dyspnea on exertion, productive cough, gum bleeding, epistaxis, hematemesis, hemoptysis, abdominal pain, abdominal swelling, early satiety, melena, hematochezia, hematuria, skin rash, spontaneous bleeding, joint swelling, heat or cold intolerance, bowel bladder incontinence, back pain, focal motor weakness, paresthesia, depression, suicidal or homocidal ideation, feeling hopelessness.   Past Medical History  Diagnosis Date  . Hypertension   . Bipolar depression   . Migraine   . Breast cancer 03/2010    s/p lumpectomy; adjuvant radiation; on adjuvant homrnal therapy    History reviewed. No pertinent past surgical history.  Current Outpatient Prescriptions  Medication Sig Dispense Refill  . amLODipine (NORVASC) 2.5 MG tablet Take 2.5 mg by mouth daily.      Marland Kitchen buPROPion (WELLBUTRIN SR) 150 MG 12 hr tablet Take 150 mg by mouth daily.      . cholecalciferol (VITAMIN D) 1000 UNITS tablet Take 1,000 Units by mouth daily.      Marland Kitchen docusate sodium (COLACE) 100 MG capsule Take 100 mg by mouth daily.      . Evening Primrose Oil 1000 MG CAPS Take by mouth.      . fish oil-omega-3 fatty acids 1000 MG capsule Take 1 g by mouth daily.      Marland Kitchen levothyroxine (SYNTHROID, LEVOTHROID) 75 MCG tablet Take 75 mcg by mouth daily.      . Multiple Vitamins-Minerals (MULTIVITAMIN WITH MINERALS) tablet Take 1 tablet by mouth daily.      . Multiple Vitamins-Minerals (PRESERVISION AREDS 2 PO) Take by mouth daily.      . naproxen (NAPROSYN) 250 MG tablet Take  250 mg by mouth as needed.      . prednisoLONE acetate (PRED FORTE) 1 % ophthalmic suspension 1 drop Daily.      . tamoxifen (NOLVADEX) 20 MG tablet Take 1 tablet (20 mg total) by mouth daily.  90 tablet  2    ALLERGIES:  is allergic to eggs or egg-derived products and penicillins.  REVIEW OF SYSTEMS:  The rest of the 14-point review of system was negative.   Filed Vitals:   09/29/12 1306  BP: 133/70    Pulse: 74  Temp: 97.5 F (36.4 C)  Resp: 18   Wt Readings from Last 3 Encounters:  09/29/12 114 lb 14.4 oz (52.118 kg)  03/26/12 114 lb 11.2 oz (52.028 kg)   ECOG Performance status: 0-1  PHYSICAL EXAMINATION:   General:  Thin appearing woman in no acute distress.  Eyes:  no scleral icterus.  ENT:  There were no oropharyngeal lesions.  Neck was without thyromegaly.  Lymphatics:  Negative cervical, supraclavicular or axillary adenopathy.  Respiratory: lungs were clear bilaterally without wheezing or crackles.  Cardiovascular:  Regular rate and rhythm, S1/S2, without murmur, rub or gallop.  There was no pedal edema.  GI:  abdomen was soft, flat, nontender, nondistended, without organomegaly.  Muscoloskeletal:  no spinal tenderness of palpation of vertebral spines themselves.  There was mild tenderness to palpation of left anterior ribs and mid upper back.  Skin exam was without echymosis, petichae.  Neuro exam was nonfocal.  Patient was able to get on and off exam table without assistance.  Gait was normal.  Patient was alerted and oriented.  Attention was good.   Language was appropriate.  Mood was normal without depression.  Speech was not pressured.  Thought content was not tangential.  Bilateral breast exam was negative for breast mass.    LABORATORY/RADIOLOGY DATA:  Lab Results  Component Value Date   WBC 8.0 09/29/2012   HGB 13.0 09/29/2012   HCT 39.5 09/29/2012   PLT 188 09/29/2012   GLUCOSE 85 09/29/2012   ALKPHOS 47 09/29/2012   ALT 24 09/29/2012   AST 25 09/29/2012   NA 142 09/29/2012   K 4.8 09/29/2012   CL 107 09/29/2012   CREATININE 1.6* 09/29/2012   BUN 28.0* 09/29/2012   CO2 23 09/29/2012    ASSESSMENT AND PLAN:   1. Bone pain:  Most likely due to musculoskeletal. She has no history of the Joint disease. She came that she had workup with her PCP x-ray that was negative. I advised her to follow with her PCP and her chiropractor to modified the exercise program to improve this  back pain. I advised her that if she has a significantly worsening back pain or weakness in the arms or paresthesia significant is no for further evaluation. At this time, I still have low clinical suspicion for bone metastases (normal calcium/alkphos). 2. History of breast cancer:  I discussed with Ms. Rabbani that she has no evidence of disease recurrence or metastatic disease. I advised her to continue adjuvant Tamoxifen.  She tolerates tamoxifen well with grade 1 hot flash which is not warrants dose modification. Her routine surveillance mammogram is due in April 2014 and I have scheduled this today. 3. History of osteoporosis:  She does not tolerate calcium at all and that is why she is on tamoxifen as opposed to an AI.  She only takes calcium in her nutrition with a lot of dairy products.  I recommended for her to get the next  bone density scan in October 2013 to see if there is any change in her bone density score at that time. She states she is due ot have this soon at her PCP office. 4. Hypothyroidism:  She is on levothyroxine per PCP.  5. Depression/anxiety:  well controlled clinically today with buproprion per PCP.  6. HTN: well controlled on Norvasc per PCP.  7. Elevated Cr:  Unclear etiology:  Her Cr today is 1.6 which has been for at least 1 year.  There is no sign of fluid overload on exam.  I defer to her PCP to decide if she needs to be referred to Nephrology.  8. Thickening of endometrium: Endometrium thickening is improved. Previous biopsy negative for malignancy. No bleeding noted. Plan to continue Tamoxifen at this time. 9. Follow up: In 5 months.    The length of time of the face-to-face encounter was 25 minutes. More than 50% of time was spent counseling and coordination of care.

## 2012-09-29 NOTE — Telephone Encounter (Signed)
Spoke with [facility] she schedule pts 1year mammo....gv and printed appt schedule for patient for April 2014

## 2013-03-25 ENCOUNTER — Telehealth: Payer: Self-pay | Admitting: Oncology

## 2013-04-16 ENCOUNTER — Other Ambulatory Visit: Payer: Medicare Other | Admitting: Lab

## 2013-04-16 ENCOUNTER — Ambulatory Visit: Payer: Medicare Other | Admitting: Oncology

## 2013-05-05 ENCOUNTER — Other Ambulatory Visit (HOSPITAL_BASED_OUTPATIENT_CLINIC_OR_DEPARTMENT_OTHER): Payer: Medicare Other | Admitting: Lab

## 2013-05-05 ENCOUNTER — Telehealth: Payer: Self-pay | Admitting: Oncology

## 2013-05-05 ENCOUNTER — Ambulatory Visit (HOSPITAL_BASED_OUTPATIENT_CLINIC_OR_DEPARTMENT_OTHER): Payer: Medicare Other | Admitting: Oncology

## 2013-05-05 VITALS — BP 136/62 | HR 80 | Temp 97.3°F | Resp 18 | Ht 64.5 in | Wt 116.7 lb

## 2013-05-05 DIAGNOSIS — Z170 Estrogen receptor positive status [ER+]: Secondary | ICD-10-CM

## 2013-05-05 DIAGNOSIS — C50419 Malignant neoplasm of upper-outer quadrant of unspecified female breast: Secondary | ICD-10-CM

## 2013-05-05 DIAGNOSIS — C50919 Malignant neoplasm of unspecified site of unspecified female breast: Secondary | ICD-10-CM

## 2013-05-05 DIAGNOSIS — C50912 Malignant neoplasm of unspecified site of left female breast: Secondary | ICD-10-CM

## 2013-05-05 DIAGNOSIS — E039 Hypothyroidism, unspecified: Secondary | ICD-10-CM

## 2013-05-05 DIAGNOSIS — M810 Age-related osteoporosis without current pathological fracture: Secondary | ICD-10-CM

## 2013-05-05 LAB — CBC WITH DIFFERENTIAL/PLATELET
Basophils Absolute: 0.1 10*3/uL (ref 0.0–0.1)
Eosinophils Absolute: 0.2 10*3/uL (ref 0.0–0.5)
HCT: 37.3 % (ref 34.8–46.6)
LYMPH%: 25 % (ref 14.0–49.7)
MCV: 90.9 fL (ref 79.5–101.0)
MONO%: 10.3 % (ref 0.0–14.0)
NEUT#: 4.7 10*3/uL (ref 1.5–6.5)
NEUT%: 60.8 % (ref 38.4–76.8)
Platelets: 182 10*3/uL (ref 145–400)
RBC: 4.1 10*6/uL (ref 3.70–5.45)

## 2013-05-05 LAB — COMPREHENSIVE METABOLIC PANEL (CC13)
Alkaline Phosphatase: 47 U/L (ref 40–150)
BUN: 29.8 mg/dL — ABNORMAL HIGH (ref 7.0–26.0)
CO2: 26 mEq/L (ref 22–29)
Creatinine: 1.6 mg/dL — ABNORMAL HIGH (ref 0.6–1.1)
Glucose: 92 mg/dl (ref 70–99)
Sodium: 141 mEq/L (ref 136–145)
Total Bilirubin: 0.42 mg/dL (ref 0.20–1.20)

## 2013-05-05 NOTE — Progress Notes (Signed)
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  She is status post left breast upper outer quadrant lumpectomy on April 11, 2010 without evidence of angiolymphatic invasion.  There was no necrosis.  The closest margin of the invasive component was 0.1cm to the posterior margin, which was pectoralis major.  ER 99%, PR 99%, Ki-67 3%, HER2/neu by CISH was negative with ratio of 1.00.  She is s/p adjuvant XRT.  CURRENT THERAPY:  started adjuvant tamoxifen in July 2011.  INTERVAL HISTORY: Shelly [name] 72 y.o. female returns for regular followup. She reported doing well.  She denied fever, anorexia, weight loss, fatigue, headache, visual changes, confusion, drenching night sweats, breast mass, palpable lymph node swelling, mucositis, odynophagia, dysphagia, nausea vomiting, jaundice, chest pain, palpitation, shortness of breath, dyspnea on exertion, productive cough, gum bleeding, epistaxis, hematemesis, hemoptysis, abdominal pain, abdominal swelling, early satiety, melena, hematochezia, hematuria, skin rash, spontaneous bleeding, joint swelling, joint pain, heat or cold intolerance, bowel bladder incontinence, back pain, focal motor weakness, paresthesia, depression.  She has intermittent bilateral calf cramping at night.  There was no swelling.     Past Medical History  Diagnosis Date  . Hypertension   . Bipolar depression   . Migraine   . Breast cancer 03/2010    s/p lumpectomy; adjuvant radiation; on adjuvant homrnal therapy    No past surgical history on file.  Current Outpatient Prescriptions  Medication Sig Dispense Refill  . amLODipine (NORVASC) 2.5 MG tablet Take 2.5 mg by mouth daily.      Marland Kitchen buPROPion (WELLBUTRIN SR) 150 MG 12 hr tablet Take 150 mg by mouth daily.      . cholecalciferol (VITAMIN D) 1000 UNITS tablet Take 1,000 Units by mouth daily.       . Evening Primrose Oil 1000 MG CAPS Take by mouth.      . fish oil-omega-3 fatty acids 1000 MG capsule Take 1 g by mouth daily.      Marland Kitchen levothyroxine (SYNTHROID, LEVOTHROID) 75 MCG tablet Take 75 mcg by mouth daily.      . Multiple Vitamins-Minerals (MULTIVITAMIN WITH MINERALS) tablet Take 1 tablet by mouth daily.      . Multiple Vitamins-Minerals (PRESERVISION AREDS 2 PO) Take by mouth daily.      . naproxen (NAPROSYN) 250 MG tablet Take 250 mg by mouth as needed.      . tamoxifen (NOLVADEX) 20 MG tablet Take 1 tablet (20 mg total) by mouth daily.  90 tablet  2   No current facility-administered medications for this visit.    ALLERGIES:  is allergic to eggs or egg-derived products and penicillins.  REVIEW OF SYSTEMS:  The rest of the 14-point review of system was negative.   Filed Vitals:   05/05/13 1439  BP: 136/62  Pulse: 80  Temp: 97.3 F (36.3 C)  Resp: 18   Wt Readings from Last 3 Encounters:  05/05/13 116 lb 11.2 oz (52.935 kg)  09/29/12 114 lb 14.4 oz (52.118 kg)  03/26/12 114 lb 11.2 oz (52.028 kg)   ECOG Performance status: 0-1  PHYSICAL EXAMINATION:   General:  Thin appearing woman in no acute distress.  Eyes:  no scleral icterus.  ENT:  There were no oropharyngeal lesions.  Neck was without thyromegaly.  Lymphatics:  Negative cervical, supraclavicular or axillary adenopathy.  Respiratory: lungs were clear bilaterally without wheezing or crackles.  Cardiovascular:  Regular rate and rhythm, S1/S2, without murmur, rub or gallop.  There was no pedal edema.  GI:  abdomen was soft, flat, nontender, nondistended, without organomegaly.  Muscoloskeletal:  no spinal tenderness of palpation of vertebral spines themselves.  There was mild tenderness to palpation of left anterior ribs and mid upper back.  Skin exam was without echymosis, petichae.  Neuro exam was nonfocal.  Patient was able to get on and off exam table without assistance.  Gait was normal.  Patient was alertand  oriented.  Attention was good.   Language was appropriate.  Mood was normal without depression.  Speech was not pressured.  Thought content was not tangential.  Bilateral breast exam was negative for breast mass,        LABORATORY/RADIOLOGY DATA:  Lab Results  Component Value Date   WBC 7.8 05/05/2013   HGB 12.3 05/05/2013   HCT 37.3 05/05/2013   PLT 182 05/05/2013   GLUCOSE 92 05/05/2013   ALKPHOS 47 05/05/2013   ALT 23 05/05/2013   AST 26 05/05/2013   NA 141 05/05/2013   K 4.7 05/05/2013   CL 107 05/05/2013   CREATININE 1.6* 05/05/2013   BUN 29.8* 05/05/2013   CO2 26 05/05/2013    ASSESSMENT AND PLAN:   1. Calf pain:  Intermittent, self resolved.  Most likely benign cramp.  There was no exam finding consistent with DVT or arterial insufficiency.  2. History of breast cancer:  I discussed with Shelly [name] that she has no evidence of disease recurrence or metastatic disease. I advised her to continue adjuvant Tamoxifen.  She continues to be in remission.  She is doing well on tamoxifen.  When she finishes her 5 years, we will discuss the pros and cons of continuing it for 5 additional years.  Her routine surveillance mammogram is due for later this month.  3. History of osteoporosis: per PCP.  She does not like to take calcium or medication for this condition.  4. Hypothyroidism:  She is on levothyroxine per PCP.  5. Depression/anxiety: on buproprion per PCP.  6. HTN: on Norvasc per PCP.  7. Elevated Cr:  Her Cr is stable.   I informed Shelly [name] that I am leaving the practice.  [facility] will arrange for her to see a new provider when she returns.    Follow up  in about 6 months.    The length of time of the face-to-face encounter was 15 minutes. More than 50% of time was spent counseling and coordination of care.

## 2013-05-05 NOTE — Telephone Encounter (Signed)
gv and printed appt sched and avs for pt for NOV °

## 2013-05-10 ENCOUNTER — Ambulatory Visit
Admission: RE | Admit: 2013-05-10 | Discharge: 2013-05-10 | Disposition: A | Discharge status: Home / Self Care | Payer: Medicare Other | Source: Ambulatory Visit | Attending: Oncology | Admitting: Oncology

## 2013-05-10 ENCOUNTER — Other Ambulatory Visit: Payer: Self-pay | Admitting: Internal Medicine

## 2013-05-10 DIAGNOSIS — C50919 Malignant neoplasm of unspecified site of unspecified female breast: Secondary | ICD-10-CM

## 2013-05-11 ENCOUNTER — Ambulatory Visit
Admission: RE | Admit: 2013-05-11 | Discharge: 2013-05-11 | Disposition: A | Discharge status: Home / Self Care | Payer: Medicare Other | Source: Ambulatory Visit | Attending: Internal Medicine | Admitting: Internal Medicine

## 2013-08-03 ENCOUNTER — Other Ambulatory Visit: Payer: Self-pay | Admitting: Oncology

## 2013-10-06 ENCOUNTER — Telehealth: Payer: Self-pay | Admitting: Hematology and Oncology

## 2013-10-06 NOTE — Telephone Encounter (Signed)
Pt called today to r/s 11/14 lb/fu. Moved f/u to NG and gv pt new appt for lb/NG 11/21 @ 1:30pm

## 2013-11-05 ENCOUNTER — Other Ambulatory Visit: Payer: Self-pay | Admitting: *Deleted

## 2013-11-05 ENCOUNTER — Other Ambulatory Visit: Payer: Medicare Other | Admitting: Lab

## 2013-11-05 ENCOUNTER — Ambulatory Visit: Payer: Medicare Other

## 2013-11-05 MED ORDER — TAMOXIFEN CITRATE 20 MG PO TABS: 20.0000 mg | ORAL_TABLET | Freq: Every day | ORAL | Status: DC

## 2013-11-11 ENCOUNTER — Telehealth: Payer: Self-pay | Admitting: Hematology and Oncology

## 2013-11-11 NOTE — Telephone Encounter (Signed)
Pt called and r/s appt for lab and MD from 11/21 to 11/28

## 2013-11-12 ENCOUNTER — Other Ambulatory Visit: Payer: Medicare Other | Admitting: Lab

## 2013-11-12 ENCOUNTER — Ambulatory Visit: Payer: Medicare Other | Admitting: Hematology and Oncology

## 2013-11-30 ENCOUNTER — Other Ambulatory Visit: Payer: Medicare Other | Admitting: Lab

## 2013-11-30 ENCOUNTER — Ambulatory Visit: Payer: Medicare Other | Admitting: Hematology and Oncology

## 2013-12-07 ENCOUNTER — Other Ambulatory Visit: Payer: Medicare Other | Admitting: Lab

## 2013-12-07 ENCOUNTER — Encounter (INDEPENDENT_AMBULATORY_CARE_PROVIDER_SITE_OTHER): Payer: Self-pay

## 2013-12-07 ENCOUNTER — Encounter: Payer: Self-pay | Admitting: Hematology and Oncology

## 2013-12-07 ENCOUNTER — Ambulatory Visit (HOSPITAL_BASED_OUTPATIENT_CLINIC_OR_DEPARTMENT_OTHER): Payer: Medicare Other | Admitting: Hematology and Oncology

## 2013-12-07 VITALS — BP 130/60 | HR 84 | Temp 98.2°F | Resp 20 | Ht 64.5 in | Wt 115.0 lb

## 2013-12-07 DIAGNOSIS — C50419 Malignant neoplasm of upper-outer quadrant of unspecified female breast: Secondary | ICD-10-CM

## 2013-12-07 DIAGNOSIS — M810 Age-related osteoporosis without current pathological fracture: Secondary | ICD-10-CM

## 2013-12-07 DIAGNOSIS — C50912 Malignant neoplasm of unspecified site of left female breast: Secondary | ICD-10-CM

## 2013-12-07 MED ORDER — ANASTROZOLE 1 MG PO TABS: 1.0000 mg | ORAL_TABLET | Freq: Every day | ORAL | Status: DC

## 2013-12-07 NOTE — Patient Instructions (Signed)

## 2013-12-07 NOTE — Progress Notes (Signed)
[place] OFFICE PROGRESS NOTE  Patient Care Team: [name], MD as PCP - General (Internal Medicine) Haywood Lasso, MD (General Surgery) Gunnar Bulla (Psychiatry) Marye Round, MD (Radiation Oncology) Elveria Royals, MD as Attending Physician (Obstetrics and Gynecology) Heath Lark, MD as Consulting Physician (Hematology and Oncology)  DIAGNOSIS: T1, N0, M0 ER positive left breast cancer status post lumpectomy, radiation therapy  SUMMARY OF ONCOLOGIC HISTORY: This is a pleasant 72 year old lady was diagnosed with breast cancer more than 3 years ago. The patient palpated a lump on the left breast. Biopsy confirmed invasive ductal carcinoma. On 04/11/2010 she underwent left breast lumpectomy we show no evidence of lymph node involvement. Margins were negative. Further testing reviewed he was ER 99% positive, PR 99% positive, Ki-67 3% and HER-2/neu negative. She received adjuvant radiation therapy followed by tamoxifen. She started on tamoxifen from July 2011.  INTERVAL HISTORY: Shelly [name] 72 y.o. female returns for further followup. The patient had extensive evaluation done recently due to discomfort in her pelvis region. Ultrasound showed no evidence of cancer. Biopsy was negative. She denies any postmenopausal bleeding. The patient was offered tamoxifen instead of aromatase inhibitor due to history of osteopenia and family history of osteoporosis. A few years ago, she also had fracture of her right arm. The patient takes calcium and vitamin D supplements. Last bone density was more than 6 months ago which show improvement of osteopenia. The patient denies any palpable breast mass, swelling of her lymph glands or nipple changes I have reviewed the past medical history, past surgical history, social history and family history with the patient and they are unchanged from previous note.  ALLERGIES:  is allergic to eggs or egg-derived products and  penicillins.  MEDICATIONS:  Current Outpatient Prescriptions  Medication Sig Dispense Refill  . amLODipine (NORVASC) 2.5 MG tablet Take 2.5 mg by mouth daily.      Marland Kitchen buPROPion (WELLBUTRIN SR) 150 MG 12 hr tablet Take 150 mg by mouth daily.      . cholecalciferol (VITAMIN D) 1000 UNITS tablet Take 1,000 Units by mouth daily.      . Evening Primrose Oil 1000 MG CAPS Take by mouth.      . fish oil-omega-3 fatty acids 1000 MG capsule Take 1 g by mouth daily.      . IBUPROFEN PO Take 150 mg by mouth daily.      Marland Kitchen levothyroxine (SYNTHROID, LEVOTHROID) 75 MCG tablet Take 75 mcg by mouth daily.      . Multiple Vitamins-Minerals (MULTIVITAMIN WITH MINERALS) tablet Take 1 tablet by mouth daily.      . Multiple Vitamins-Minerals (PRESERVISION AREDS 2 PO) Take by mouth daily.      . tamoxifen (NOLVADEX) 20 MG tablet Take 1 tablet (20 mg total) by mouth daily.  90 tablet  2  . anastrozole (ARIMIDEX) 1 MG tablet Take 1 tablet (1 mg total) by mouth daily.  90 tablet  3  . naproxen (NAPROSYN) 250 MG tablet Take 250 mg by mouth as needed.       No current facility-administered medications for this visit.    REVIEW OF SYSTEMS:   Constitutional: Denies fevers, chills or abnormal weight loss Eyes: Denies blurriness of vision Ears, nose, mouth, throat, and face: Denies mucositis or sore throat Respiratory: Denies cough, dyspnea or wheezes Cardiovascular: Denies palpitation, chest discomfort or lower extremity swelling Gastrointestinal:  Denies nausea, heartburn or change in bowel habits Skin: Denies abnormal skin rashes Lymphatics:  Denies new lymphadenopathy or easy bruising Neurological:Denies numbness, tingling or new weaknesses Behavioral/Psych: Mood is stable, no new changes  All other systems were reviewed with the patient and are negative.  PHYSICAL EXAMINATION: ECOG PERFORMANCE STATUS: 0 - Asymptomatic  Filed Vitals:   12/07/13 1353  BP: 130/60  Pulse: 84  Temp: 98.2 F (36.8 C)  Resp:  20   Filed Weights   12/07/13 1353  Weight: 115 lb (52.164 kg)    GENERAL:alert, no distress and comfortable. Patient looks thin SKIN: skin color, texture, turgor are normal, no rashes or significant lesions EYES: normal, Conjunctiva are pink and non-injected, sclera clear OROPHARYNX:no exudate, no erythema and lips, buccal mucosa, and tongue normal  NECK: supple, thyroid normal size, non-tender, without nodularity LYMPH:  no palpable lymphadenopathy in the cervical, axillary or inguinal LUNGS: clear to auscultation and percussion with normal breathing effort HEART: regular rate & rhythm and no murmurs and no lower extremity edema ABDOMEN:abdomen soft, non-tender and normal bowel sounds Musculoskeletal:no cyanosis of digits and no clubbing  NEURO: alert & oriented x 3 with fluent speech, no focal motor/sensory deficits Bilateral breast examination were performed. Normal breast exam on the right. On the left, well-healed lumpectomy scar with no palpable abnormalities LABORATORY DATA:  I have reviewed the data as listed    Component Value Date/Time   NA 141 05/05/2013 1430   NA 141 03/26/2012 1136   K 4.7 05/05/2013 1430   K 4.8 03/26/2012 1136   CL 107 05/05/2013 1430   CL 107 03/26/2012 1136   CO2 26 05/05/2013 1430   CO2 26 03/26/2012 1136   GLUCOSE 92 05/05/2013 1430   GLUCOSE 87 03/26/2012 1136   BUN 29.8* 05/05/2013 1430   BUN 32* 03/26/2012 1136   CREATININE 1.6* 05/05/2013 1430   CREATININE 1.64* 03/26/2012 1136   CALCIUM 8.7 05/05/2013 1430   CALCIUM 9.0 03/26/2012 1136   PROT 6.7 05/05/2013 1430   PROT 6.4 03/26/2012 1136   ALBUMIN 3.3* 05/05/2013 1430   ALBUMIN 4.0 03/26/2012 1136   AST 26 05/05/2013 1430   AST 29 03/26/2012 1136   ALT 23 05/05/2013 1430   ALT 26 03/26/2012 1136   ALKPHOS 47 05/05/2013 1430   ALKPHOS 44 03/26/2012 1136   BILITOT 0.42 05/05/2013 1430   BILITOT 0.4 03/26/2012 1136   GFRNONAA 37* 04/09/2010 1300   GFRAA  Value: 45        The eGFR has been calculated using the MDRD  equation. This calculation has not been validated in all clinical situations. eGFR's persistently <60 mL/min signify possible Chronic Kidney Disease.* 04/09/2010 1300    No results found for this basename: SPEP, UPEP,  kappa and lambda light chains    Lab Results  Component Value Date   WBC 7.8 05/05/2013   NEUTROABS 4.7 05/05/2013   HGB 12.3 05/05/2013   HCT 37.3 05/05/2013   MCV 90.9 05/05/2013   PLT 182 05/05/2013      Chemistry      Component Value Date/Time   NA 141 05/05/2013 1430   NA 141 03/26/2012 1136   K 4.7 05/05/2013 1430   K 4.8 03/26/2012 1136   CL 107 05/05/2013 1430   CL 107 03/26/2012 1136   CO2 26 05/05/2013 1430   CO2 26 03/26/2012 1136   BUN 29.8* 05/05/2013 1430   BUN 32* 03/26/2012 1136   CREATININE 1.6* 05/05/2013 1430   CREATININE 1.64* 03/26/2012 1136      Component Value Date/Time   CALCIUM 8.7  05/05/2013 1430   CALCIUM 9.0 03/26/2012 1136   ALKPHOS 47 05/05/2013 1430   ALKPHOS 44 03/26/2012 1136   AST 26 05/05/2013 1430   AST 29 03/26/2012 1136   ALT 23 05/05/2013 1430   ALT 26 03/26/2012 1136   BILITOT 0.42 05/05/2013 1430   BILITOT 0.4 03/26/2012 1136      ASSESSMENT & PLAN:  #1 left breast cancer The patient had completed almost 3-1/2 years of tamoxifen. We discussed about possible changing her to aromatase inhibitor especially of concern for possible uterine malignancy. I discussed with her the pros and cons of changing her to aromatase inhibitor including potential worsening osteoporosis. I gave her patient education handout and a prescription of aromatase inhibitor. Goal of adjuvant treatment would be for 5 years until July of 2016. Her next mammogram is due in May of 2015. #2 history of osteopenia/osteoporosis She continue calcium with vitamin D. Her next bone density will be due next year Orders Placed This Encounter  Procedures  . MM Digital Screening    Standing Status: Future     Number of Occurrences:      Standing Expiration Date: 02/06/2015    Order  Specific Question:  Reason for Exam (SYMPTOM  OR DIAGNOSIS REQUIRED)    Answer:  Hx left breast ca, r/o recurrence    Order Specific Question:  Preferred imaging location?    Answer:  [facility]   All questions were answered. The patient knows to call the clinic with any problems, questions or concerns. No barriers to learning was detected. I spent 25 minutes counseling the patient face to face. The total time spent in the appointment was 40 minutes and more than 50% was on counseling and review of test results     [facility], [place], MD 12/07/2013 2:27 PM

## 2013-12-09 ENCOUNTER — Telehealth: Payer: Self-pay | Admitting: Hematology and Oncology

## 2013-12-09 NOTE — Telephone Encounter (Signed)
, °

## 2014-05-17 ENCOUNTER — Ambulatory Visit
Admission: RE | Admit: 2014-05-17 | Discharge: 2014-05-17 | Disposition: A | Discharge status: Home / Self Care | Payer: Medicare Other | Source: Ambulatory Visit | Attending: Hematology and Oncology | Admitting: Hematology and Oncology

## 2014-05-17 DIAGNOSIS — C50912 Malignant neoplasm of unspecified site of left female breast: Secondary | ICD-10-CM

## 2014-06-09 ENCOUNTER — Encounter: Payer: Self-pay | Admitting: Hematology and Oncology

## 2014-06-09 ENCOUNTER — Ambulatory Visit (HOSPITAL_BASED_OUTPATIENT_CLINIC_OR_DEPARTMENT_OTHER): Payer: Medicare Other | Admitting: Hematology and Oncology

## 2014-06-09 ENCOUNTER — Other Ambulatory Visit: Payer: Self-pay | Admitting: Hematology and Oncology

## 2014-06-09 ENCOUNTER — Other Ambulatory Visit (HOSPITAL_BASED_OUTPATIENT_CLINIC_OR_DEPARTMENT_OTHER): Payer: Medicare Other

## 2014-06-09 VITALS — BP 136/51 | HR 76 | Temp 97.7°F | Resp 18 | Ht 64.5 in | Wt 115.1 lb

## 2014-06-09 DIAGNOSIS — C50419 Malignant neoplasm of upper-outer quadrant of unspecified female breast: Secondary | ICD-10-CM

## 2014-06-09 DIAGNOSIS — C50912 Malignant neoplasm of unspecified site of left female breast: Secondary | ICD-10-CM

## 2014-06-09 DIAGNOSIS — Z170 Estrogen receptor positive status [ER+]: Secondary | ICD-10-CM

## 2014-06-09 NOTE — Progress Notes (Signed)
[place] OFFICE PROGRESS NOTE  Patient Care Team: [name], MD as PCP - General (Internal Medicine) Haywood Lasso, MD (General Surgery) Gunnar Bulla, MD (Psychiatry) Marye Round, MD (Radiation Oncology) Elveria Royals, MD as Attending Physician (Obstetrics and Gynecology) Heath Lark, MD as Consulting Physician (Hematology and Oncology) DIAGNOSIS: T1, N0, M0 ER positive left breast cancer status post lumpectomy, radiation therapy  SUMMARY OF ONCOLOGIC HISTORY: This is a pleasant 73 year old lady was diagnosed with breast cancer more than 3 years ago. The patient palpated a lump on the left breast. Biopsy confirmed invasive ductal carcinoma. On 04/11/2010 she underwent left breast lumpectomy we show no evidence of lymph node involvement. Margins were negative. Further testing reviewed he was ER 99% positive, PR 99% positive, Ki-67 3% and HER-2/neu negative. She received adjuvant radiation therapy followed by tamoxifen. She started on tamoxifen from July 2011.  INTERVAL HISTORY: Please see below for problem oriented charting. She feels well with occasional hot flashes. Denies worsening depression. She denies any recent abnormal breast examination, palpable mass, abnormal breast appearance or nipple changes  REVIEW OF SYSTEMS:   Constitutional: Denies fevers, chills or abnormal weight loss Eyes: Denies blurriness of vision Ears, nose, mouth, throat, and face: Denies mucositis or sore throat Respiratory: Denies cough, dyspnea or wheezes Cardiovascular: Denies palpitation, chest discomfort or lower extremity swelling Gastrointestinal:  Denies nausea, heartburn or change in bowel habits Skin: Denies abnormal skin rashes Lymphatics: Denies new lymphadenopathy or easy bruising Neurological:Denies numbness, tingling or new weaknesses Behavioral/Psych: Mood is stable, no new changes  All other systems were reviewed with the patient and are negative.  I  have reviewed the past medical history, past surgical history, social history and family history with the patient and they are unchanged from previous note.  ALLERGIES:  is allergic to morphine and related; eggs or egg-derived products; and penicillins.  MEDICATIONS:  Current Outpatient Prescriptions  Medication Sig Dispense Refill  . ALPRAZolam (XANAX) 0.25 MG tablet Take 0.25 mg by mouth at bedtime as needed for anxiety.      Marland Kitchen amLODipine (NORVASC) 2.5 MG tablet Take 2.5 mg by mouth daily.      Marland Kitchen buPROPion (WELLBUTRIN SR) 150 MG 12 hr tablet Take 150 mg by mouth daily.      . cholecalciferol (VITAMIN D) 1000 UNITS tablet Take 1,000 Units by mouth daily.      . Evening Primrose Oil 1000 MG CAPS Take by mouth.      . fish oil-omega-3 fatty acids 1000 MG capsule Take 1 g by mouth daily.      . IBUPROFEN PO Take 150 mg by mouth daily.      Marland Kitchen levothyroxine (SYNTHROID, LEVOTHROID) 75 MCG tablet Take 75 mcg by mouth daily.      . Multiple Vitamins-Minerals (MULTIVITAMIN WITH MINERALS) tablet Take 1 tablet by mouth daily.      . Multiple Vitamins-Minerals (PRESERVISION AREDS 2 PO) Take by mouth daily.      . naproxen (NAPROSYN) 250 MG tablet Take 250 mg by mouth as needed.      . tamoxifen (NOLVADEX) 20 MG tablet Take 1 tablet (20 mg total) by mouth daily.  90 tablet  2   No current facility-administered medications for this visit.    PHYSICAL EXAMINATION: ECOG PERFORMANCE STATUS: 0 - Asymptomatic  Filed Vitals:   06/09/14 1201  BP: 136/51  Pulse: 76  Temp: 97.7 F (36.5 C)  Resp: 18   Filed Weights   06/09/14  1201  Weight: 115 lb 1.6 oz (52.209 kg)    GENERAL:alert, no distress and comfortable. She looks thin but not cachectic SKIN: skin color, texture, turgor are normal, no rashes or significant lesions EYES: normal, Conjunctiva are pink and non-injected, sclera clear OROPHARYNX:no exudate, no erythema and lips, buccal mucosa, and tongue normal  NECK: supple, thyroid normal  size, non-tender, without nodularity LYMPH:  no palpable lymphadenopathy in the cervical, axillary or inguinal LUNGS: clear to auscultation and percussion with normal breathing effort HEART: regular rate & rhythm and no murmurs and no lower extremity edema ABDOMEN:abdomen soft, non-tender and normal bowel sounds Musculoskeletal:no cyanosis of digits and no clubbing  NEURO: alert & oriented x 3 with fluent speech, no focal motor/sensory deficits Will well-healed lumpectomy scar on the left with no other abnormalities. Normal breast exam on the right. LABORATORY DATA:  I have reviewed the data as listed    Component Value Date/Time   NA 141 05/05/2013 1430   NA 141 03/26/2012 1136   K 4.7 05/05/2013 1430   K 4.8 03/26/2012 1136   CL 107 05/05/2013 1430   CL 107 03/26/2012 1136   CO2 26 05/05/2013 1430   CO2 26 03/26/2012 1136   GLUCOSE 92 05/05/2013 1430   GLUCOSE 87 03/26/2012 1136   BUN 29.8* 05/05/2013 1430   BUN 32* 03/26/2012 1136   CREATININE 1.6* 05/05/2013 1430   CREATININE 1.64* 03/26/2012 1136   CALCIUM 8.7 05/05/2013 1430   CALCIUM 9.0 03/26/2012 1136   PROT 6.7 05/05/2013 1430   PROT 6.4 03/26/2012 1136   ALBUMIN 3.3* 05/05/2013 1430   ALBUMIN 4.0 03/26/2012 1136   AST 26 05/05/2013 1430   AST 29 03/26/2012 1136   ALT 23 05/05/2013 1430   ALT 26 03/26/2012 1136   ALKPHOS 47 05/05/2013 1430   ALKPHOS 44 03/26/2012 1136   BILITOT 0.42 05/05/2013 1430   BILITOT 0.4 03/26/2012 1136   GFRNONAA 37* 04/09/2010 1300   GFRAA  Value: 45        The eGFR has been calculated using the MDRD equation. This calculation has not been validated in all clinical situations. eGFR's persistently <60 mL/min signify possible Chronic Kidney Disease.* 04/09/2010 1300    No results found for this basename: SPEP,  UPEP,   kappa and lambda light chains    Lab Results  Component Value Date   WBC 7.8 05/05/2013   NEUTROABS 4.7 05/05/2013   HGB 12.3 05/05/2013   HCT 37.3 05/05/2013   MCV 90.9 05/05/2013   PLT 182 05/05/2013       Chemistry      Component Value Date/Time   NA 141 05/05/2013 1430   NA 141 03/26/2012 1136   K 4.7 05/05/2013 1430   K 4.8 03/26/2012 1136   CL 107 05/05/2013 1430   CL 107 03/26/2012 1136   CO2 26 05/05/2013 1430   CO2 26 03/26/2012 1136   BUN 29.8* 05/05/2013 1430   BUN 32* 03/26/2012 1136   CREATININE 1.6* 05/05/2013 1430   CREATININE 1.64* 03/26/2012 1136      Component Value Date/Time   CALCIUM 8.7 05/05/2013 1430   CALCIUM 9.0 03/26/2012 1136   ALKPHOS 47 05/05/2013 1430   ALKPHOS 44 03/26/2012 1136   AST 26 05/05/2013 1430   AST 29 03/26/2012 1136   ALT 23 05/05/2013 1430   ALT 26 03/26/2012 1136   BILITOT 0.42 05/05/2013 1430   BILITOT 0.4 03/26/2012 1136     ASSESSMENT & PLAN:  Breast cancer  We had a long discussion in previous visit about switching her over to aromatase inhibitor but the patient declined. Overall, she tolerated treatment well. I also discussed with her results of recent clinical trials (aTTom and ATLAS) which proved 10 years worth of tamoxifen is superior than 5 years but the patient is inclined to complete only 5 years worth of treatment which I think is reasonable. I plan to see her back next July which would mark her 5 years worth of treatment and we will have further discussion about discontinuation of therapy.       All questions were answered. The patient knows to call the clinic with any problems, questions or concerns. No barriers to learning was detected. I spent 15 minutes counseling the patient face to face. The total time spent in the appointment was 20 minutes and more than 50% was on counseling and review of test results     [facility], [place], MD 06/09/2014 12:23 PM

## 2014-06-09 NOTE — Assessment & Plan Note (Signed)
We had a long discussion in previous visit about switching her over to aromatase inhibitor but the patient declined. Overall, she tolerated treatment well. I also discussed with her results of recent clinical trials (aTTom and ATLAS) which proved 10 years worth of tamoxifen is superior than 5 years but the patient is inclined to complete only 5 years worth of treatment which I think is reasonable. I plan to see her back next July which would mark her 5 years worth of treatment and we will have further discussion about discontinuation of therapy.

## 2014-06-10 ENCOUNTER — Telehealth: Payer: Self-pay | Admitting: Hematology and Oncology

## 2014-06-10 NOTE — Telephone Encounter (Signed)
m °

## 2014-08-11 ENCOUNTER — Other Ambulatory Visit: Payer: Self-pay | Admitting: Hematology and Oncology

## 2015-02-20 ENCOUNTER — Telehealth: Payer: Self-pay | Admitting: *Deleted

## 2015-02-20 NOTE — Telephone Encounter (Signed)
Signed dispensing order and last MD office note faxed to [facility]

## 2015-02-25 ENCOUNTER — Encounter: Payer: Self-pay | Admitting: Gastroenterology

## 2015-04-12 ENCOUNTER — Other Ambulatory Visit: Payer: Self-pay | Admitting: Hematology and Oncology

## 2015-04-12 DIAGNOSIS — Z853 Personal history of malignant neoplasm of breast: Secondary | ICD-10-CM

## 2015-05-17 DIAGNOSIS — R809 Proteinuria, unspecified: Secondary | ICD-10-CM | POA: Insufficient documentation

## 2015-05-17 DIAGNOSIS — I129 Hypertensive chronic kidney disease with stage 1 through stage 4 chronic kidney disease, or unspecified chronic kidney disease: Secondary | ICD-10-CM | POA: Insufficient documentation

## 2015-05-17 DIAGNOSIS — Z0000 Encounter for general adult medical examination without abnormal findings: Secondary | ICD-10-CM | POA: Insufficient documentation

## 2015-05-24 ENCOUNTER — Ambulatory Visit
Admission: RE | Admit: 2015-05-24 | Discharge: 2015-05-24 | Disposition: A | Discharge status: Home / Self Care | Payer: Medicare Other | Source: Ambulatory Visit | Attending: Hematology and Oncology | Admitting: Hematology and Oncology

## 2015-05-24 DIAGNOSIS — Z853 Personal history of malignant neoplasm of breast: Secondary | ICD-10-CM

## 2015-05-26 DIAGNOSIS — J449 Chronic obstructive pulmonary disease, unspecified: Secondary | ICD-10-CM | POA: Insufficient documentation

## 2015-05-26 DIAGNOSIS — J309 Allergic rhinitis, unspecified: Secondary | ICD-10-CM | POA: Insufficient documentation

## 2015-05-26 DIAGNOSIS — H9190 Unspecified hearing loss, unspecified ear: Secondary | ICD-10-CM | POA: Insufficient documentation

## 2015-05-26 DIAGNOSIS — D692 Other nonthrombocytopenic purpura: Secondary | ICD-10-CM | POA: Insufficient documentation

## 2015-07-03 ENCOUNTER — Telehealth: Payer: Self-pay | Admitting: Hematology and Oncology

## 2015-07-03 ENCOUNTER — Encounter: Payer: Self-pay | Admitting: Hematology and Oncology

## 2015-07-03 ENCOUNTER — Ambulatory Visit (HOSPITAL_BASED_OUTPATIENT_CLINIC_OR_DEPARTMENT_OTHER): Payer: Medicare Other | Admitting: Hematology and Oncology

## 2015-07-03 VITALS — BP 143/57 | HR 76 | Temp 98.2°F | Resp 17 | Ht 64.5 in | Wt 118.9 lb

## 2015-07-03 DIAGNOSIS — C50212 Malignant neoplasm of upper-inner quadrant of left female breast: Secondary | ICD-10-CM | POA: Diagnosis not present

## 2015-07-03 DIAGNOSIS — Z170 Estrogen receptor positive status [ER+]: Secondary | ICD-10-CM

## 2015-07-03 DIAGNOSIS — M810 Age-related osteoporosis without current pathological fracture: Secondary | ICD-10-CM | POA: Diagnosis not present

## 2015-07-03 DIAGNOSIS — C50912 Malignant neoplasm of unspecified site of left female breast: Secondary | ICD-10-CM

## 2015-07-03 MED ORDER — RALOXIFENE HCL 60 MG PO TABS: 60.0000 mg | ORAL_TABLET | Freq: Every day | ORAL | Status: DC

## 2015-07-03 NOTE — Assessment & Plan Note (Signed)
Overall, she tolerated treatment well. I also discussed with her results of recent clinical trials (aTTom and ATLAS) which proved 10 years worth of tamoxifen is superior than 5 years but the patient is inclined to complete only 5 years worth of treatment which I think is reasonable. She will finish treatment at the end of the spine. She has significant osteoporosis. I shared with the data regarding the use of Evista for osteoporosis and prevention of breast cancer. I gave her patient education handout and a prescription to hang onto in case she is interested.

## 2015-07-03 NOTE — Telephone Encounter (Signed)
lvm for pt regarding to July 2017 appt....mailed pt appt sched/avs and letter

## 2015-07-03 NOTE — Assessment & Plan Note (Signed)
She have history of osteoporosis and loss of height. She has a lot of degenerative joint pain at the lower back. Clinical examination is not suspicious for bone cancer. She is doing well with conservative management. As mentioned above, we discussed about possible the use of Evista to prevent recurrence of breast cancer and osteoporosis treatment. Reinforced importance of calcium with vitamin D supplement

## 2015-07-03 NOTE — Progress Notes (Signed)
[facility] OFFICE PROGRESS NOTE  Patient Care Team: Shon Baton, MD as PCP - General (Internal Medicine) Neldon Mc, MD (General Surgery) Gunnar Bulla, MD (Psychiatry) Kyung Rudd, MD (Radiation Oncology) Azucena Fallen, MD as Attending Physician (Obstetrics and Gynecology) Heath Lark, MD as Consulting Physician (Hematology and Oncology)  SUMMARY OF ONCOLOGIC HISTORY:  DIAGNOSIS: T1, N0, M0 ER positive left breast cancer status post lumpectomy, radiation therapy  SUMMARY OF ONCOLOGIC HISTORY: This is a pleasant 74 year old lady was diagnosed with breast cancer more than 3 years ago. The patient palpated a lump on the left breast. Biopsy confirmed invasive ductal carcinoma. On 04/11/2010 she underwent left breast lumpectomy we show no evidence of lymph node involvement. Margins were negative. Further testing reviewed he was ER 99% positive, PR 99% positive, Ki-67 3% and HER-2/neu negative. She received adjuvant radiation therapy followed by tamoxifen. She started on tamoxifen from July 2011.  INTERVAL HISTORY: Please see below for problem oriented charting. She denies any recent abnormal breast examination, palpable mass, abnormal breast appearance or nipple changes She complained of lower back pain with occasional radiation down to her leg. She is getting therapy with a chiropractor. She is compliant taking calcium and vitamin D.  REVIEW OF SYSTEMS:   Constitutional: Denies fevers, chills or abnormal weight loss Eyes: Denies blurriness of vision Ears, nose, mouth, throat, and face: Denies mucositis or sore throat Respiratory: Denies cough, dyspnea or wheezes Cardiovascular: Denies palpitation, chest discomfort or lower extremity swelling Gastrointestinal:  Denies nausea, heartburn or change in bowel habits Skin: Denies abnormal skin rashes Lymphatics: Denies new lymphadenopathy or easy bruising Neurological:Denies numbness, tingling or new  weaknesses Behavioral/Psych: Mood is stable, no new changes  All other systems were reviewed with the patient and are negative.  I have reviewed the past medical history, past surgical history, social history and family history with the patient and they are unchanged from previous note.  ALLERGIES:  is allergic to morphine and related; eggs or egg-derived products; and penicillins.  MEDICATIONS:  Current Outpatient Prescriptions  Medication Sig Dispense Refill  . acetaminophen (TYLENOL) 325 MG tablet Take 650 mg by mouth every 6 (six) hours as needed.    . cetirizine (ZYRTEC) 10 MG tablet Take 10 mg by mouth daily.    . hydrochlorothiazide (HYDRODIURIL) 12.5 MG tablet Take 12.5 mg by mouth daily.    Marland Kitchen ALPRAZolam (XANAX) 0.25 MG tablet Take 0.25 mg by mouth at bedtime as needed for anxiety.    Marland Kitchen amLODipine (NORVASC) 2.5 MG tablet Take 2.5 mg by mouth daily.    . cholecalciferol (VITAMIN D) 1000 UNITS tablet Take 1,000 Units by mouth daily.    . Evening Primrose Oil 1000 MG CAPS Take by mouth.    . fish oil-omega-3 fatty acids 1000 MG capsule Take 1 g by mouth daily.    . IBUPROFEN PO Take 150 mg by mouth daily.    Marland Kitchen levothyroxine (SYNTHROID, LEVOTHROID) 75 MCG tablet Take 75 mcg by mouth daily.    . Multiple Vitamins-Minerals (MULTIVITAMIN WITH MINERALS) tablet Take 1 tablet by mouth daily.    . Multiple Vitamins-Minerals (PRESERVISION AREDS 2 PO) Take by mouth daily.    . raloxifene (EVISTA) 60 MG tablet Take 1 tablet (60 mg total) by mouth daily. 30 tablet 6  . tamoxifen (NOLVADEX) 20 MG tablet TAKE ONE TABLET BY MOUTH ONCE DAILY 90 tablet 3   No current facility-administered medications for this visit.    PHYSICAL EXAMINATION: ECOG PERFORMANCE STATUS: 0 - Asymptomatic  Filed Vitals:   07/03/15 1139  BP: 143/57  Pulse: 76  Temp: 98.2 F (36.8 C)  Resp: 17   Filed Weights   07/03/15 1139  Weight: 118 lb 14.4 oz (53.933 kg)    GENERAL:alert, no distress and  comfortable SKIN: skin color, texture, turgor are normal, no rashes or significant lesions EYES: normal, Conjunctiva are pink and non-injected, sclera clear OROPHARYNX:no exudate, no erythema and lips, buccal mucosa, and tongue normal  NECK: supple, thyroid normal size, non-tender, without nodularity LYMPH:  no palpable lymphadenopathy in the cervical, axillary or inguinal LUNGS: clear to auscultation and percussion with normal breathing effort HEART: regular rate & rhythm and no murmurs and no lower extremity edema ABDOMEN:abdomen soft, non-tender and normal bowel sounds Musculoskeletal:no cyanosis of digits and no clubbing  NEURO: alert & oriented x 3 with fluent speech, no focal motor/sensory deficits Bilateral breast examination was performed. Well-healed lumpectomy scar on the left with no other abnormalities LABORATORY DATA:  I have reviewed the data as listed    Component Value Date/Time   NA 141 05/05/2013 1430   NA 141 03/26/2012 1136   K 4.7 05/05/2013 1430   K 4.8 03/26/2012 1136   CL 107 05/05/2013 1430   CL 107 03/26/2012 1136   CO2 26 05/05/2013 1430   CO2 26 03/26/2012 1136   GLUCOSE 92 05/05/2013 1430   GLUCOSE 87 03/26/2012 1136   BUN 29.8* 05/05/2013 1430   BUN 32* 03/26/2012 1136   CREATININE 1.6* 05/05/2013 1430   CREATININE 1.64* 03/26/2012 1136   CALCIUM 8.7 05/05/2013 1430   CALCIUM 9.0 03/26/2012 1136   PROT 6.7 05/05/2013 1430   PROT 6.4 03/26/2012 1136   ALBUMIN 3.3* 05/05/2013 1430   ALBUMIN 4.0 03/26/2012 1136   AST 26 05/05/2013 1430   AST 29 03/26/2012 1136   ALT 23 05/05/2013 1430   ALT 26 03/26/2012 1136   ALKPHOS 47 05/05/2013 1430   ALKPHOS 44 03/26/2012 1136   BILITOT 0.42 05/05/2013 1430   BILITOT 0.4 03/26/2012 1136   GFRNONAA 37* 04/09/2010 1300   GFRAA * 04/09/2010 1300    45        The eGFR has been calculated using the MDRD equation. This calculation has not been validated in all clinical situations. eGFR's persistently <60  mL/min signify possible Chronic Kidney Disease.    No results found for: SPEP, UPEP  Lab Results  Component Value Date   WBC 7.8 05/05/2013   NEUTROABS 4.7 05/05/2013   HGB 12.3 05/05/2013   HCT 37.3 05/05/2013   MCV 90.9 05/05/2013   PLT 182 05/05/2013      Chemistry      Component Value Date/Time   NA 141 05/05/2013 1430   NA 141 03/26/2012 1136   K 4.7 05/05/2013 1430   K 4.8 03/26/2012 1136   CL 107 05/05/2013 1430   CL 107 03/26/2012 1136   CO2 26 05/05/2013 1430   CO2 26 03/26/2012 1136   BUN 29.8* 05/05/2013 1430   BUN 32* 03/26/2012 1136   CREATININE 1.6* 05/05/2013 1430   CREATININE 1.64* 03/26/2012 1136      Component Value Date/Time   CALCIUM 8.7 05/05/2013 1430   CALCIUM 9.0 03/26/2012 1136   ALKPHOS 47 05/05/2013 1430   ALKPHOS 44 03/26/2012 1136   AST 26 05/05/2013 1430   AST 29 03/26/2012 1136   ALT 23 05/05/2013 1430   ALT 26 03/26/2012 1136   BILITOT 0.42 05/05/2013 1430   BILITOT 0.4 03/26/2012 1136  RADIOGRAPHIC STUDIES: Recent mammogram was negative I have personally reviewed the radiological images as listed and agreed with the findings in the report.   ASSESSMENT & PLAN:  Breast cancer, left breast Overall, she tolerated treatment well. I also discussed with her results of recent clinical trials (aTTom and ATLAS) which proved 10 years worth of tamoxifen is superior than 5 years but the patient is inclined to complete only 5 years worth of treatment which I think is reasonable. She will finish treatment at the end of the spine. She has significant osteoporosis. I shared with the data regarding the use of Evista for osteoporosis and prevention of breast cancer. I gave her patient education handout and a prescription to hang onto in case she is interested.     Osteoporosis She have history of osteoporosis and loss of height. She has a lot of degenerative joint pain at the lower back. Clinical examination is not suspicious for  bone cancer. She is doing well with conservative management. As mentioned above, we discussed about possible the use of Evista to prevent recurrence of breast cancer and osteoporosis treatment. Reinforced importance of calcium with vitamin D supplement    No orders of the defined types were placed in this encounter.   All questions were answered. The patient knows to call the clinic with any problems, questions or concerns. No barriers to learning was detected. I spent 15 minutes counseling the patient face to face. The total time spent in the appointment was 20 minutes and more than 50% was on counseling and review of test results     [facility], [place], MD 07/03/2015 12:19 PM

## 2016-02-02 ENCOUNTER — Ambulatory Visit: Payer: Medicare Other | Admitting: Internal Medicine

## 2016-04-08 ENCOUNTER — Other Ambulatory Visit: Payer: Self-pay | Admitting: Hematology and Oncology

## 2016-04-08 DIAGNOSIS — Z853 Personal history of malignant neoplasm of breast: Secondary | ICD-10-CM

## 2016-05-21 ENCOUNTER — Other Ambulatory Visit: Payer: Self-pay | Admitting: Obstetrics & Gynecology

## 2016-05-21 NOTE — Patient Instructions (Addendum)
Your procedure is scheduled on:  Friday, May 24, 2016  Enter through the [employer] [facility] at: 12:00 Eva up the phone at the desk and dial [phone].  Call this number if you have problems the morning of surgery: [phone].  Remember:  Do NOT eat food:  After Midnight Thursday  Do NOT drink clear liquids after:  9:00 AM day of surgery  Take these medicines the morning of surgery with a SIP OF WATER:  Losartan, Synthroid  Do NOT wear jewelry (body piercing), metal hair clips/bobby pins, make-up, or nail polish. Do NOT wear lotions, powders, or perfumes.  You may wear deodorant. Do NOT shave for 48 hours prior to surgery. Do NOT bring valuables to the hospital. Contacts, dentures, or bridgework may not be worn into surgery.  Have a responsible adult drive you home and stay with you for 24 hours after your procedure

## 2016-05-22 ENCOUNTER — Other Ambulatory Visit: Payer: Self-pay

## 2016-05-22 ENCOUNTER — Encounter (HOSPITAL_COMMUNITY)
Admission: RE | Admit: 2016-05-22 | Discharge: 2016-05-22 | Disposition: A | Discharge status: Home / Self Care | Payer: Medicare Other | Source: Ambulatory Visit | Attending: Obstetrics & Gynecology | Admitting: Obstetrics & Gynecology

## 2016-05-22 ENCOUNTER — Encounter (HOSPITAL_COMMUNITY): Payer: Self-pay

## 2016-05-22 DIAGNOSIS — Z885 Allergy status to narcotic agent status: Secondary | ICD-10-CM | POA: Diagnosis not present

## 2016-05-22 DIAGNOSIS — Z91012 Allergy to eggs: Secondary | ICD-10-CM | POA: Diagnosis not present

## 2016-05-22 DIAGNOSIS — N840 Polyp of corpus uteri: Secondary | ICD-10-CM | POA: Diagnosis not present

## 2016-05-22 DIAGNOSIS — N183 Chronic kidney disease, stage 3 (moderate): Secondary | ICD-10-CM | POA: Diagnosis not present

## 2016-05-22 DIAGNOSIS — F419 Anxiety disorder, unspecified: Secondary | ICD-10-CM | POA: Diagnosis not present

## 2016-05-22 DIAGNOSIS — F319 Bipolar disorder, unspecified: Secondary | ICD-10-CM | POA: Diagnosis not present

## 2016-05-22 DIAGNOSIS — Z853 Personal history of malignant neoplasm of breast: Secondary | ICD-10-CM | POA: Diagnosis not present

## 2016-05-22 DIAGNOSIS — Z888 Allergy status to other drugs, medicaments and biological substances status: Secondary | ICD-10-CM | POA: Diagnosis not present

## 2016-05-22 DIAGNOSIS — I129 Hypertensive chronic kidney disease with stage 1 through stage 4 chronic kidney disease, or unspecified chronic kidney disease: Secondary | ICD-10-CM | POA: Diagnosis not present

## 2016-05-22 DIAGNOSIS — J449 Chronic obstructive pulmonary disease, unspecified: Secondary | ICD-10-CM | POA: Diagnosis not present

## 2016-05-22 DIAGNOSIS — Z9079 Acquired absence of other genital organ(s): Secondary | ICD-10-CM | POA: Diagnosis not present

## 2016-05-22 DIAGNOSIS — I341 Nonrheumatic mitral (valve) prolapse: Secondary | ICD-10-CM | POA: Diagnosis not present

## 2016-05-22 DIAGNOSIS — Z87891 Personal history of nicotine dependence: Secondary | ICD-10-CM | POA: Diagnosis not present

## 2016-05-22 DIAGNOSIS — E039 Hypothyroidism, unspecified: Secondary | ICD-10-CM | POA: Diagnosis not present

## 2016-05-22 DIAGNOSIS — Z79899 Other long term (current) drug therapy: Secondary | ICD-10-CM | POA: Diagnosis not present

## 2016-05-22 DIAGNOSIS — N950 Postmenopausal bleeding: Secondary | ICD-10-CM | POA: Diagnosis present

## 2016-05-22 DIAGNOSIS — Z881 Allergy status to other antibiotic agents status: Secondary | ICD-10-CM | POA: Diagnosis not present

## 2016-05-22 HISTORY — DX: Other specified disorders of bone density and structure, unspecified site: M85.80

## 2016-05-22 HISTORY — DX: Scoliosis, unspecified: M41.9

## 2016-05-22 HISTORY — DX: Anxiety disorder, unspecified: F41.9

## 2016-05-22 HISTORY — DX: Chronic kidney disease, unspecified: N18.9

## 2016-05-22 HISTORY — DX: Unspecified fracture of shaft of humerus, right arm, initial encounter for closed fracture: S42.301A

## 2016-05-22 HISTORY — DX: Unspecified macular degeneration: H35.30

## 2016-05-22 HISTORY — DX: Other specified postprocedural states: Z98.890

## 2016-05-22 HISTORY — DX: Hypothyroidism, unspecified: E03.9

## 2016-05-22 HISTORY — DX: Unspecified osteoarthritis, unspecified site: M19.90

## 2016-05-22 HISTORY — DX: Allergic rhinitis due to pollen: J30.1

## 2016-05-22 HISTORY — DX: Sciatica, unspecified side: M54.30

## 2016-05-22 HISTORY — DX: Other specified postprocedural states: R11.2

## 2016-05-22 HISTORY — DX: Nonrheumatic mitral (valve) prolapse: I34.1

## 2016-05-22 HISTORY — DX: Chronic obstructive pulmonary disease, unspecified: J44.9

## 2016-05-22 LAB — BASIC METABOLIC PANEL
ANION GAP: 8 (ref 5–15)
BUN: 40 mg/dL — ABNORMAL HIGH (ref 6–20)
CHLORIDE: 110 mmol/L (ref 101–111)
CO2: 23 mmol/L (ref 22–32)
CREATININE: 1.46 mg/dL — AB (ref 0.44–1.00)
Calcium: 9.3 mg/dL (ref 8.9–10.3)
GFR calc non Af Amer: 33 mL/min — ABNORMAL LOW (ref >60)
GFR, EST AFRICAN AMERICAN: 39 mL/min — AB (ref >60)
Glucose, Bld: 95 mg/dL (ref 65–99)
Potassium: 5.3 mmol/L — ABNORMAL HIGH (ref 3.5–5.1)
SODIUM: 141 mmol/L (ref 135–145)

## 2016-05-22 LAB — CBC
HEMATOCRIT: 37.5 % (ref 36.0–46.0)
HEMOGLOBIN: 12.3 g/dL (ref 12.0–15.0)
MCH: 29.1 pg (ref 26.0–34.0)
MCHC: 32.8 g/dL (ref 30.0–36.0)
MCV: 88.7 fL (ref 78.0–100.0)
Platelets: 229 10*3/uL (ref 150–400)
RBC: 4.23 MIL/uL (ref 3.87–5.11)
RDW: 13.4 % (ref 11.5–15.5)
WBC: 7.2 10*3/uL (ref 4.0–10.5)

## 2016-05-22 NOTE — Pre-Procedure Instructions (Signed)
Dr. Royce Macadamia reviewed EKG, no new orders received at this time.

## 2016-05-24 ENCOUNTER — Ambulatory Visit (HOSPITAL_COMMUNITY): Payer: Medicare Other | Admitting: Anesthesiology

## 2016-05-24 ENCOUNTER — Encounter (HOSPITAL_COMMUNITY): Payer: Self-pay

## 2016-05-24 ENCOUNTER — Ambulatory Visit (HOSPITAL_COMMUNITY)
Admission: RE | Admit: 2016-05-24 | Discharge: 2016-05-24 | Disposition: A | Discharge status: Home / Self Care | Payer: Medicare Other | Source: Ambulatory Visit | Attending: Obstetrics & Gynecology | Admitting: Obstetrics & Gynecology

## 2016-05-24 ENCOUNTER — Encounter (HOSPITAL_COMMUNITY)
Admission: RE | Disposition: A | Discharge status: Home / Self Care | Payer: Self-pay | Source: Ambulatory Visit | Attending: Obstetrics & Gynecology

## 2016-05-24 DIAGNOSIS — N840 Polyp of corpus uteri: Secondary | ICD-10-CM | POA: Diagnosis not present

## 2016-05-24 DIAGNOSIS — Z91012 Allergy to eggs: Secondary | ICD-10-CM | POA: Insufficient documentation

## 2016-05-24 DIAGNOSIS — Z79899 Other long term (current) drug therapy: Secondary | ICD-10-CM | POA: Insufficient documentation

## 2016-05-24 DIAGNOSIS — J449 Chronic obstructive pulmonary disease, unspecified: Secondary | ICD-10-CM | POA: Insufficient documentation

## 2016-05-24 DIAGNOSIS — I341 Nonrheumatic mitral (valve) prolapse: Secondary | ICD-10-CM | POA: Insufficient documentation

## 2016-05-24 DIAGNOSIS — N183 Chronic kidney disease, stage 3 (moderate): Secondary | ICD-10-CM | POA: Insufficient documentation

## 2016-05-24 DIAGNOSIS — Z9079 Acquired absence of other genital organ(s): Secondary | ICD-10-CM | POA: Insufficient documentation

## 2016-05-24 DIAGNOSIS — Z885 Allergy status to narcotic agent status: Secondary | ICD-10-CM | POA: Insufficient documentation

## 2016-05-24 DIAGNOSIS — Z87891 Personal history of nicotine dependence: Secondary | ICD-10-CM | POA: Insufficient documentation

## 2016-05-24 DIAGNOSIS — Z853 Personal history of malignant neoplasm of breast: Secondary | ICD-10-CM | POA: Insufficient documentation

## 2016-05-24 DIAGNOSIS — F419 Anxiety disorder, unspecified: Secondary | ICD-10-CM | POA: Insufficient documentation

## 2016-05-24 DIAGNOSIS — I129 Hypertensive chronic kidney disease with stage 1 through stage 4 chronic kidney disease, or unspecified chronic kidney disease: Secondary | ICD-10-CM | POA: Insufficient documentation

## 2016-05-24 DIAGNOSIS — N950 Postmenopausal bleeding: Secondary | ICD-10-CM | POA: Diagnosis not present

## 2016-05-24 DIAGNOSIS — Z888 Allergy status to other drugs, medicaments and biological substances status: Secondary | ICD-10-CM | POA: Insufficient documentation

## 2016-05-24 DIAGNOSIS — Z881 Allergy status to other antibiotic agents status: Secondary | ICD-10-CM | POA: Insufficient documentation

## 2016-05-24 DIAGNOSIS — F319 Bipolar disorder, unspecified: Secondary | ICD-10-CM | POA: Insufficient documentation

## 2016-05-24 DIAGNOSIS — E039 Hypothyroidism, unspecified: Secondary | ICD-10-CM | POA: Insufficient documentation

## 2016-05-24 HISTORY — PX: DILATATION & CURETTAGE/HYSTEROSCOPY WITH MYOSURE: SHX6511

## 2016-05-24 SURGERY — DILATATION & CURETTAGE/HYSTEROSCOPY WITH MYOSURE
Anesthesia: Monitor Anesthesia Care

## 2016-05-24 MED ORDER — LIDOCAINE HCL 1 % IJ SOLN
INTRAMUSCULAR | Status: AC
  Filled 2016-05-24: qty 20

## 2016-05-24 MED ORDER — FENTANYL CITRATE (PF) 100 MCG/2ML IJ SOLN
INTRAMUSCULAR | Status: DC | PRN
  Administered 2016-05-24 (×3): 25 ug via INTRAVENOUS

## 2016-05-24 MED ORDER — FENTANYL CITRATE (PF) 100 MCG/2ML IJ SOLN
INTRAMUSCULAR | Status: AC
  Filled 2016-05-24: qty 2

## 2016-05-24 MED ORDER — FENTANYL CITRATE (PF) 100 MCG/2ML IJ SOLN
25.0000 ug | INTRAMUSCULAR | Status: DC | PRN
  Administered 2016-05-24 (×2): 25 ug via INTRAVENOUS

## 2016-05-24 MED ORDER — MEPERIDINE HCL 25 MG/ML IJ SOLN: 6.2500 mg | INTRAMUSCULAR | Status: DC | PRN

## 2016-05-24 MED ORDER — LIDOCAINE HCL 1 % IJ SOLN
INTRAMUSCULAR | Status: DC | PRN
  Administered 2016-05-24: 20 mL

## 2016-05-24 MED ORDER — METOCLOPRAMIDE HCL 5 MG/ML IJ SOLN: 10.0000 mg | Freq: Once | INTRAMUSCULAR | Status: DC | PRN

## 2016-05-24 MED ORDER — ONDANSETRON HCL 4 MG/2ML IJ SOLN
INTRAMUSCULAR | Status: DC | PRN
  Administered 2016-05-24: 4 mg via INTRAVENOUS

## 2016-05-24 MED ORDER — ACETAMINOPHEN 500 MG PO TABS: 500.0000 mg | ORAL_TABLET | Freq: Four times a day (QID) | ORAL | Status: DC | PRN

## 2016-05-24 MED ORDER — DIPHENHYDRAMINE HCL 50 MG/ML IJ SOLN
INTRAMUSCULAR | Status: DC | PRN
  Administered 2016-05-24 (×2): 12.5 mg via INTRAVENOUS

## 2016-05-24 MED ORDER — PROPOFOL 500 MG/50ML IV EMUL
INTRAVENOUS | Status: DC | PRN
  Administered 2016-05-24 (×2): 20 mg via INTRAVENOUS
  Administered 2016-05-24: 10 mg via INTRAVENOUS
  Administered 2016-05-24 (×2): 20 mg via INTRAVENOUS
  Administered 2016-05-24: 40 mg via INTRAVENOUS
  Administered 2016-05-24: 20 mg via INTRAVENOUS

## 2016-05-24 MED ORDER — LACTATED RINGERS IV SOLN
INTRAVENOUS | Status: DC
  Administered 2016-05-24: 13:00:00 via INTRAVENOUS

## 2016-05-24 SURGICAL SUPPLY — 18 items
CANISTER SUCT 3000ML (MISCELLANEOUS) ×3 IMPLANT
CATH ROBINSON RED A/P 16FR (CATHETERS) ×3 IMPLANT
CLOTH BEACON ORANGE TIMEOUT ST (SAFETY) ×3 IMPLANT
CONTAINER PREFILL 10% NBF 60ML (FORM) ×6 IMPLANT
DEVICE MYOSURE LITE (MISCELLANEOUS) ×2 IMPLANT
DEVICE MYOSURE REACH (MISCELLANEOUS) IMPLANT
FILTER ARTHROSCOPY CONVERTOR (FILTER) ×3 IMPLANT
GLOVE BIO SURGEON STRL SZ7 (GLOVE) ×3 IMPLANT
GLOVE BIOGEL PI IND STRL 7.0 (GLOVE) ×3 IMPLANT
GLOVE BIOGEL PI INDICATOR 7.0 (GLOVE) ×6
GOWN STRL REUS W/TWL LRG LVL3 (GOWN DISPOSABLE) ×6 IMPLANT
PACK VAGINAL MINOR WOMEN LF (CUSTOM PROCEDURE TRAY) ×3 IMPLANT
PAD OB MATERNITY 4.3X12.25 (PERSONAL CARE ITEMS) ×3 IMPLANT
SEAL ROD LENS SCOPE MYOSURE (ABLATOR) ×3 IMPLANT
TOWEL OR 17X24 6PK STRL BLUE (TOWEL DISPOSABLE) ×6 IMPLANT
TUBING AQUILEX INFLOW (TUBING) ×3 IMPLANT
TUBING AQUILEX OUTFLOW (TUBING) ×3 IMPLANT
WATER STERILE IRR 1000ML POUR (IV SOLUTION) ×3 IMPLANT

## 2016-05-24 NOTE — Anesthesia Postprocedure Evaluation (Signed)
Anesthesia Post Note  Patient: Shelly [name]  Procedure(s) Performed: Procedure(s) (LRB): DILATATION & CURETTAGE/HYSTEROSCOPY WITH MYOSURE (N/A)  Patient location during evaluation: PACU Anesthesia Type: MAC Level of consciousness: awake and alert Pain management: pain level controlled Vital Signs Assessment: post-procedure vital signs reviewed and stable Respiratory status: spontaneous breathing, nonlabored ventilation, respiratory function stable and patient connected to nasal cannula oxygen Cardiovascular status: stable and blood pressure returned to baseline Anesthetic complications: no     Last Vitals:  Filed Vitals:   05/24/16 1545 05/24/16 1612  BP: 155/62 173/74  Pulse:  86  Temp: 36.9 C 37 C  Resp:  14    Last Pain:  Filed Vitals:   05/24/16 1627  PainSc: 0-No pain   Pain Goal: Patients Stated Pain Goal: 2 (05/24/16 1445)               Montez Hageman

## 2016-05-24 NOTE — Transfer of Care (Signed)
Immediate Anesthesia Transfer of Care Note  Patient: Shelly [name]  Procedure(s) Performed: Procedure(s): DILATATION & CURETTAGE/HYSTEROSCOPY WITH MYOSURE (N/A)  Patient Location: PACU  Anesthesia Type:MAC  Level of Consciousness: awake, sedated and patient cooperative  Airway & Oxygen Therapy: Patient Spontanous Breathing and Patient connected to nasal cannula oxygen  Post-op Assessment: Report given to RN and Post -op Vital signs reviewed and stable  Post vital signs: Reviewed and stable  Last Vitals:  Filed Vitals:   05/24/16 1212 05/24/16 1410  BP: 164/82   Pulse: 88   Temp: 36.7 C 36.7 C  Resp: 16     Last Pain: There were no vitals filed for this visit.       Complications: No apparent anesthesia complications

## 2016-05-24 NOTE — H&P (Signed)
Shelly [name] is an 75 y.o. female with postmenopausal bleeding and suspected endometrial polyp on office sonogram. Hx of Tamoxifen use, stopped 2016 after 5 yrs of use.  No HRT.  Interval hx- HA last night on left side of face after taking Fleet's enema, didn't sleep well, BP high this morning at home. Tylenol this AM helped.   No LMP recorded. Patient is postmenopausal.    Past Medical History  Diagnosis Date  . Hypertension   . Migraine   . MVP (mitral valve prolapse)   . COPD (chronic obstructive pulmonary disease) ([place])   . Hay fever   . Hypothyroidism   . Chronic kidney disease     stage 3  . Anxiety   . Arthritis     right elbow  . Right arm fracture   . Breast cancer ([place]) 03/2010    left breast s/p lumpectomy; adjuvant radiation; on adjuvant homrnal therapy  . Macular degeneration   . Osteopenia   . PONV (postoperative nausea and vomiting)   . Scoliosis   . Sciatic pain   . 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 Breast lumpectomy    . Right arm surgery      due to fracture  . Tonsillectomy    . Appendectomy    . Laparotomy    . Unilateral salpingectomy Left   . Tubal ligation    . Dilation and curettage of uterus      uterine polyps  . Hernia repair    . Cataract extraction Right   . Eye surgery Right     retina  . Colonoscopy    . Upper gi endoscopy    . Wisdom tooth extraction      History reviewed. No pertinent family history.  Social History:  reports that she quit smoking about 30 years ago. She has never used smokeless tobacco. She reports that she does not drink alcohol or use illicit drugs.  Allergies:  Allergies  Allergen Reactions  . Morphine And Related Nausea And Vomiting  . Amoxicillin     Has patient had a PCN reaction causing immediate rash, facial/tongue/throat swelling, SOB or lightheadedness with hypotension: Yes Has patient had a PCN reaction causing severe rash  involving mucus membranes or skin necrosis: Yes Has patient had a PCN reaction that required hospitalization Yes Has patient had a PCN reaction occurring within the last 10 years: No If all of the above answers are "NO", then may proceed with Cephalosporin use.    . Eggs Or Egg-Derived Products Swelling  . Penicillins Other (See Comments)    Febrile, diarrhea,fever Has patient had a PCN reaction causing immediate rash, facial/tongue/throat swelling, SOB or lightheadedness with hypotension: Yes Has patient had a PCN reaction causing severe rash involving mucus membranes or skin necrosis: Yes Has patient had a PCN reaction that required hospitalization Yes Has patient had a PCN reaction occurring within the last 10 years: No If all of the above answers are "NO", then may proceed with Cephalosporin use.   . Lamictal [Lamotrigine] Rash    Prescriptions prior to admission  Medication Sig Dispense Refill Last Dose  . cetirizine (ZYRTEC) 10 MG tablet Take 10 mg by mouth daily.    Past Month at Unknown time  . cholecalciferol (VITAMIN D) 1000 UNITS tablet Take 1,000 Units by mouth daily.   Past Week at Unknown time  . levothyroxine (SYNTHROID, LEVOTHROID) 75 MCG tablet  Take 75 mcg by mouth daily.    05/23/2016 at 0900  . losartan (COZAAR) 25 MG tablet Take 25 mg by mouth daily.   05/23/2016 at 0900    ROS HA better, no weakness in legs/arms or face.   Blood pressure 164/82, pulse 88, temperature 98 F (36.7 C), temperature source Oral, resp. rate 16, SpO2 100 %. Physical Exam A&O x 3, no acute distress. Pleasant HEENT neg, no thyromegaly, no grimace or drooping eyelid and smiles normal  Lungs CTA bilat CV RRR, S1S2 normal Abdo soft, non tender, non acute Extr no edema/ tenderness, no weakness or loss of sensation.  Pelvic deferred now  No results found for this or any previous visit (from the past 24 hour(s)).  No results found.  Assessment/Plan: 75 yo with menopausal bleeding, thick  endometrial stripe on sono. Here for Hysteroscopy, polypectomy with Myosure, D&C. Pt took Cytotec to soften cervix due to menopausal cervical stenosis.  Risks/complications of surgery reviewed incl infection, bleeding, damage to internal organs including bladder, bowels, ureters, blood vessels, other risks from anesthesia, VTE and delayed complications of any surgery, complications in future surgery reviewed.    Hilja Kintzel R 05/24/2016, 1:27 PM

## 2016-05-24 NOTE — Op Note (Signed)
Preoperative diagnosis: postmenopausal bleeding, thick endometrial stripe, suspect endometrial polyp, prior tamoxifen use. Postop diagnosis: postmenopausal bleeding.  Procedure: Myosure Hysteroscopic guided endometrial curettage and D&C Anesthesia General- MAC and Paracervical block Surgeon: Azucena Fallen, MD  Assistant:None IV fluids 400 cc LR Estimated blood loss  5 cc Urine output: straight catheter preop  75 cc Complications none  Condition stable  Disposition PACU  Specimen: Endometrial curettings   Procedure  Indication: Postmenopausal bleeding. Prior Tamoxifen use for 5 years, stopped 2016.  Office sono noted thick endometrial stripe with suspected polyp. Hysteroscopic D&C and possible polypectomy was discussed, patient was counseled on risks/ complications including infection, bleeding, damage to internal organs, she understood and agrees, gave informed written consent.  Patient was brought to the operating room with IV running. Time out was carried out. She received preop 1 gm Ancef. She underwent general anesthesia via MAC without complications. She was given dorsolithotomy position. Parts were prepped and draped in standard fashion. Bladder was catheterized once. Bimanual exam revealed uterus to be anteverted and normal size. Sims retractor was placed and cervix was grasped with single-tooth tenaculum. Cervical block with 20 cc 1% plain Xylocaine given. The uterus was sounded to 8 cm. Cervical os was dilated to 21 [place] dilator. Myosure hysteroscope was introduced in the uterine cavity under vision, using Saline for irrigation.  Findings: Thickened endometrial tissue but no poylp/ lesion noted. Tubal ostii seen well. Cervical canal appeared normal.   Hysteroscopic curettage was performed, instruments removed and manual curettage done. Specimen sent to path.   Fluid deficit 400 cc (possible error by machine, post-op pelvic.abdomen sonogram at bedside -no peritoneal fluid noted.).  All  counts are correct x2. No complications. Patient was made supine dorsal anesthesia and brought to the recovery room in stable condition.  Patient will be discharged home today. Discharge meds Tylenol prn.. Follow up in 2 weeks in office. Warning signs of infection and excessive bleeding reviewed.   I performed the surgery. V.Hideo Googe, MD.

## 2016-05-24 NOTE — Anesthesia Preprocedure Evaluation (Addendum)
Anesthesia Evaluation  Patient identified by MRN, date of birth, ID band Patient awake    Reviewed: Allergy & Precautions, NPO status , Patient's Chart, lab work & pertinent test results  History of Anesthesia Complications (+) PONV  Airway Mallampati: II  TM Distance: >3 FB Neck ROM: Full    Dental no notable dental hx.    Pulmonary COPD, former smoker,    Pulmonary exam normal breath sounds clear to auscultation       Cardiovascular hypertension, Pt. on medications Normal cardiovascular exam+ Valvular Problems/Murmurs MVP  Rhythm:Regular Rate:Normal     Neuro/Psych Anxiety Depression Bipolar Disorder negative neurological ROS  negative psych ROS   GI/Hepatic negative GI ROS, Neg liver ROS,   Endo/Other  negative endocrine ROS  Renal/GU negative Renal ROS  negative genitourinary   Musculoskeletal negative musculoskeletal ROS (+)   Abdominal   Peds negative pediatric ROS (+)  Hematology negative hematology ROS (+)   Anesthesia Other Findings   Reproductive/Obstetrics negative OB ROS                           Anesthesia Physical Anesthesia Plan  ASA: II  Anesthesia Plan: General and MAC   Post-op Pain Management:    Induction: Intravenous  Airway Management Planned: LMA  Additional Equipment:   Intra-op Plan:   Post-operative Plan: Extubation in OR  Informed Consent: I have reviewed the patients History and Physical, chart, labs and discussed the procedure including the risks, benefits and alternatives for the proposed anesthesia with the patient or authorized representative who has indicated his/her understanding and acceptance.   Dental advisory given  Plan Discussed with: CRNA  Anesthesia Plan Comments:        Anesthesia Quick Evaluation

## 2016-05-24 NOTE — Discharge Instructions (Addendum)
Hysteroscopy Hysteroscopy is a procedure used for looking inside the womb (uterus). It may be done for various reasons, including:  To evaluate abnormal bleeding, fibroid (benign, noncancerous) tumors, polyps, scar tissue (adhesions), and possibly cancer of the uterus.  To look for lumps (tumors) and other uterine growths.  To look for causes of why a woman cannot get pregnant (infertility), causes of recurrent loss of pregnancy (miscarriages), or a lost intrauterine device (IUD).  To perform a sterilization by blocking the fallopian tubes from inside the uterus. In this procedure, a thin, flexible tube with a tiny light and camera on the end of it (hysteroscope) is used to look inside the uterus. A hysteroscopy should be done right after a menstrual period to be sure you are not pregnant. LET [facility] CARE PROVIDER KNOW ABOUT:   Any allergies you have.  All medicines you are taking, including vitamins, herbs, eye drops, creams, and over-the-counter medicines.  Previous problems you or members of your family have had with the use of anesthetics.  Any blood disorders you have.  Previous surgeries you have had.  Medical conditions you have. RISKS AND COMPLICATIONS  Generally, this is a safe procedure. However, as with any procedure, complications can occur. Possible complications include:  Putting a hole in the uterus.  Excessive bleeding.  Infection.  Damage to the cervix.  Injury to other organs.  Allergic reaction to medicines.  Too much fluid used in the uterus for the procedure. BEFORE THE PROCEDURE   Ask your health care provider about changing or stopping any regular medicines.  Do not take aspirin or blood thinners for 1 week before the procedure, or as directed by your health care provider. These can cause bleeding.  If you smoke, do not smoke for 2 weeks before the procedure.  In some cases, a medicine is placed in the cervix the day before the procedure.  This medicine makes the cervix have a larger opening (dilate). This makes it easier for the instrument to be inserted into the uterus during the procedure.  Do not eat or drink anything for at least 8 hours before the surgery.  Arrange for someone to take you home after the procedure. PROCEDURE   You may be given a medicine to relax you (sedative). You may also be given one of the following:  A medicine that numbs the area around the cervix (local anesthetic).  A medicine that makes you sleep through the procedure (general anesthetic).  The hysteroscope is inserted through the vagina into the uterus. The camera on the hysteroscope sends a picture to a TV screen. This gives the surgeon a good view inside the uterus.  During the procedure, air or a liquid is put into the uterus, which allows the surgeon to see better.  Sometimes, tissue is gently scraped from inside the uterus. These tissue samples are sent to a lab for testing. AFTER THE PROCEDURE   If you had a general anesthetic, you may be groggy for a couple hours after the procedure.  If you had a local anesthetic, you will be able to go home as soon as you are stable and feel ready.  You may have some cramping. This normally lasts for a couple days.  You may have bleeding, which varies from light spotting for a few days to menstrual-like bleeding for 3-7 days. This is normal.  If your test results are not back during the visit, make an appointment with your health care provider to find out the  results.   This information is not intended to replace advice given to you by your health care provider. Make sure you discuss any questions you have with your health care provider.   Document Released: 03/17/2001 Document Revised: 09/29/2013 Document Reviewed: 07/08/2013 Elsevier Interactive Patient Education 2016 [employer] INSTRUCTIONS: HYSTEROSCOPY / ENDOMETRIAL ABLATION The following instructions have been prepared  to help you care for yourself upon your return home.  May take stool softner while taking narcotic pain medication to prevent constipation.  Drink plenty of water.  Personal hygiene:  Use sanitary pads for vaginal drainage, not tampons.  Shower the day after your procedure.  NO tub baths, pools or Jacuzzis for 2-3 weeks.  Wipe front to back after using the bathroom.  Activity and limitations:  Do NOT drive or operate any equipment for 24 hours. The effects of anesthesia are still present and drowsiness may result.  Do NOT rest in bed all day.  Walking is encouraged.  Walk up and down stairs slowly.  You may resume your normal activity in one to two days or as indicated by your physician. Sexual activity: NO intercourse for at least 2 weeks after the procedure, or as indicated by your Doctor.  Diet: Eat a light meal as desired this evening. You may resume your usual diet tomorrow.  Return to Work: You may resume your work activities in one to two days or as indicated by Marine scientist.  What to expect after your surgery: Expect to have vaginal bleeding/discharge for 2-3 days and spotting for up to 10 days. It is not unusual to have soreness for up to 1-2 weeks. You may have a slight burning sensation when you urinate for the first day. Mild cramps may continue for a couple of days. You may have a regular period in 2-6 weeks.  Call your doctor for any of the following:  Excessive vaginal bleeding or clotting, saturating and changing one pad every hour.  Inability to urinate 6 hours after discharge from hospital.  Pain not relieved by pain medication.  Fever of 100.4 F or greater.  Unusual vaginal discharge or odor.  Return to office _________________Call for an appointment ___________________ Patients signature: ______________________ Nurses signature ________________________  [facility] [phone]

## 2016-05-27 ENCOUNTER — Encounter (HOSPITAL_COMMUNITY): Payer: Self-pay | Admitting: Obstetrics & Gynecology

## 2016-05-29 ENCOUNTER — Ambulatory Visit
Admission: RE | Admit: 2016-05-29 | Discharge: 2016-05-29 | Disposition: A | Discharge status: Home / Self Care | Payer: Medicare Other | Source: Ambulatory Visit | Attending: Hematology and Oncology | Admitting: Hematology and Oncology

## 2016-05-29 DIAGNOSIS — Z853 Personal history of malignant neoplasm of breast: Secondary | ICD-10-CM

## 2016-06-20 ENCOUNTER — Telehealth: Payer: Self-pay | Admitting: Hematology and Oncology

## 2016-06-20 NOTE — Telephone Encounter (Signed)
returned call and s.w. pt and confirmed appt....pt ok and aware °

## 2016-07-02 ENCOUNTER — Ambulatory Visit (HOSPITAL_BASED_OUTPATIENT_CLINIC_OR_DEPARTMENT_OTHER): Payer: Medicare Other | Admitting: Hematology and Oncology

## 2016-07-02 ENCOUNTER — Other Ambulatory Visit: Payer: Self-pay | Admitting: Hematology and Oncology

## 2016-07-02 ENCOUNTER — Encounter: Payer: Self-pay | Admitting: Hematology and Oncology

## 2016-07-02 VITALS — BP 131/53 | HR 81 | Temp 98.0°F | Resp 18 | Wt 120.4 lb

## 2016-07-02 DIAGNOSIS — M810 Age-related osteoporosis without current pathological fracture: Secondary | ICD-10-CM

## 2016-07-02 DIAGNOSIS — Z853 Personal history of malignant neoplasm of breast: Secondary | ICD-10-CM | POA: Diagnosis not present

## 2016-07-02 NOTE — Assessment & Plan Note (Signed)
She have history of osteoporosis and loss of height. She has a lot of degenerative joint pain at the lower back. Clinical examination is not suspicious for bone cancer. She is doing well with conservative management. As mentioned above, we discussed about the use of Evista to prevent recurrence of breast cancer and osteoporosis treatment. I reinforced importance of calcium with vitamin D supplement

## 2016-07-02 NOTE — Progress Notes (Signed)
[place] OFFICE PROGRESS NOTE  Patient Care Team: Shon Baton, MD as PCP - General (Internal Medicine) Gunnar Bulla, MD (Psychiatry) Kyung Rudd, MD (Radiation Oncology) Azucena Fallen, MD as Attending Physician (Obstetrics and Gynecology) Heath Lark, MD as Consulting Physician (Hematology and Oncology)  SUMMARY OF ONCOLOGIC HISTORY:  DIAGNOSIS: T1, N0, M0 ER positive left breast cancer status post lumpectomy, radiation therapy  SUMMARY OF ONCOLOGIC HISTORY: This is a pleasant 75 year old lady was diagnosed with breast cancer more than 3 years ago. The patient palpated a lump on the left breast. Biopsy confirmed invasive ductal carcinoma. On 04/11/2010 she underwent left breast lumpectomy we show no evidence of lymph node involvement. Margins were negative. Further testing reviewed he was ER 99% positive, PR 99% positive, Ki-67 3% and HER-2/neu negative. She received adjuvant radiation therapy followed by tamoxifen. She started on tamoxifen from July 2011, completed by July 2016. At the end of 2016, she started taking Evista  INTERVAL HISTORY: Please see below for problem oriented charting. She denies any recent abnormal breast examination, palpable mass, abnormal breast appearance or nipple changes She complained of lower back pain with occasional radiation down to her leg. She is getting therapy with a chiropractor. She is compliant taking calcium and vitamin D. She had recent D&C; pathology was benign She had history of recurrent skin rashes last year; her symptoms finally resolved by early this year.  REVIEW OF SYSTEMS:   Constitutional: Denies fevers, chills or abnormal weight loss Eyes: Denies blurriness of vision Ears, nose, mouth, throat, and face: Denies mucositis or sore throat Respiratory: Denies cough, dyspnea or wheezes Cardiovascular: Denies palpitation, chest discomfort or lower extremity swelling Gastrointestinal:  Denies nausea, heartburn or change  in bowel habits Skin: Denies abnormal skin rashes Lymphatics: Denies new lymphadenopathy or easy bruising Neurological:Denies numbness, tingling or new weaknesses Behavioral/Psych: Mood is stable, no new changes  All other systems were reviewed with the patient and are negative.  I have reviewed the past medical history, past surgical history, social history and family history with the patient and they are unchanged from previous note.  ALLERGIES:  is allergic to morphine and related; amoxicillin; eggs or egg-derived products; penicillins; and lamictal.  MEDICATIONS:  Current Outpatient Prescriptions  Medication Sig Dispense Refill  . cholecalciferol (VITAMIN D) 1000 UNITS tablet Take 1,000 Units by mouth daily.    Marland Kitchen levothyroxine (SYNTHROID, LEVOTHROID) 75 MCG tablet Take 75 mcg by mouth daily. 50 mcg twice a week and 75 mcg the rest of the week    . losartan (COZAAR) 25 MG tablet Take 25 mg by mouth daily.    . raloxifene (EVISTA) 60 MG tablet TAKE ONE TABLET BY MOUTH ONCE DAILY 90 tablet 9   No current facility-administered medications for this visit.    PHYSICAL EXAMINATION: ECOG PERFORMANCE STATUS: 1  Filed Vitals:   07/02/16 1143  BP: 131/53  Pulse: 81  Temp: 98 F (36.7 C)  Resp: 18   Filed Weights   07/02/16 1143  Weight: 120 lb 6 oz (54.602 kg)    GENERAL:alert, no distress and comfortable SKIN: skin color, texture, turgor are normal, no rashes or significant lesions EYES: normal, Conjunctiva are pink and non-injected, sclera clear OROPHARYNX:no exudate, no erythema and lips, buccal mucosa, and tongue normal  NECK: supple, thyroid normal size, non-tender, without nodularity LYMPH:  no palpable lymphadenopathy in the cervical, axillary or inguinal LUNGS: clear to auscultation and percussion with normal breathing effort HEART: regular rate & rhythm and no murmurs  and no lower extremity edema ABDOMEN:abdomen soft, non-tender and normal bowel  sounds Musculoskeletal:no cyanosis of digits and no clubbing  NEURO: alert & oriented x 3 with fluent speech, no focal motor/sensory deficits Bilateral breast examination showed well heal lumpectomy scar on the left without palpable abnormalities  LABORATORY DATA:  I have reviewed the data as listed    Component Value Date/Time   NA 141 05/22/2016 1420   NA 141 05/05/2013 1430   K 5.3* 05/22/2016 1420   K 4.7 05/05/2013 1430   CL 110 05/22/2016 1420   CL 107 05/05/2013 1430   CO2 23 05/22/2016 1420   CO2 26 05/05/2013 1430   GLUCOSE 95 05/22/2016 1420   GLUCOSE 92 05/05/2013 1430   BUN 40* 05/22/2016 1420   BUN 29.8* 05/05/2013 1430   CREATININE 1.46* 05/22/2016 1420   CREATININE 1.6* 05/05/2013 1430   CALCIUM 9.3 05/22/2016 1420   CALCIUM 8.7 05/05/2013 1430   PROT 6.7 05/05/2013 1430   PROT 6.4 03/26/2012 1136   ALBUMIN 3.3* 05/05/2013 1430   ALBUMIN 4.0 03/26/2012 1136   AST 26 05/05/2013 1430   AST 29 03/26/2012 1136   ALT 23 05/05/2013 1430   ALT 26 03/26/2012 1136   ALKPHOS 47 05/05/2013 1430   ALKPHOS 44 03/26/2012 1136   BILITOT 0.42 05/05/2013 1430   BILITOT 0.4 03/26/2012 1136   GFRNONAA 33* 05/22/2016 1420   GFRAA 39* 05/22/2016 1420    No results found for: SPEP, UPEP  Lab Results  Component Value Date   WBC 7.2 05/22/2016   NEUTROABS 4.7 05/05/2013   HGB 12.3 05/22/2016   HCT 37.5 05/22/2016   MCV 88.7 05/22/2016   PLT 229 05/22/2016      Chemistry      Component Value Date/Time   NA 141 05/22/2016 1420   NA 141 05/05/2013 1430   K 5.3* 05/22/2016 1420   K 4.7 05/05/2013 1430   CL 110 05/22/2016 1420   CL 107 05/05/2013 1430   CO2 23 05/22/2016 1420   CO2 26 05/05/2013 1430   BUN 40* 05/22/2016 1420   BUN 29.8* 05/05/2013 1430   CREATININE 1.46* 05/22/2016 1420   CREATININE 1.6* 05/05/2013 1430      Component Value Date/Time   CALCIUM 9.3 05/22/2016 1420   CALCIUM 8.7 05/05/2013 1430   ALKPHOS 47 05/05/2013 1430   ALKPHOS 44  03/26/2012 1136   AST 26 05/05/2013 1430   AST 29 03/26/2012 1136   ALT 23 05/05/2013 1430   ALT 26 03/26/2012 1136   BILITOT 0.42 05/05/2013 1430   BILITOT 0.4 03/26/2012 1136       RADIOGRAPHIC STUDIES:Recent mammogram is within normal limits ASSESSMENT & PLAN:  History of breast cancer Overall, she tolerated treatment well. I also discussed with her results of recent clinical trials (aTTom and ATLAS) which proved 10 years worth of tamoxifen is superior than 5 years but the patient is inclined to complete only 5 years worth of treatment which I think is reasonable. She completed her Treatment in 2016.Marland Kitchen She has significant osteoporosis. I shared with the data regarding the use of Evista for osteoporosis and prevention of breast cancer She started taking Evista and is doing well I recommend discharge from the clinic She is comfortable with just PCP follow-up and annual mammogram  Osteoporosis She have history of osteoporosis and loss of height. She has a lot of degenerative joint pain at the lower back. Clinical examination is not suspicious for bone cancer. She is doing  well with conservative management. As mentioned above, we discussed about the use of Evista to prevent recurrence of breast cancer and osteoporosis treatment. I reinforced importance of calcium with vitamin D supplement      No orders of the defined types were placed in this encounter.   All questions were answered. The patient knows to call the clinic with any problems, questions or concerns. No barriers to learning was detected. I spent 15 minutes counseling the patient face to face. The total time spent in the appointment was 20 minutes and more than 50% was on counseling and review of test results     [facility], [place], MD 07/02/2016 5:55 PM

## 2016-07-02 NOTE — Assessment & Plan Note (Signed)
Overall, she tolerated treatment well. I also discussed with her results of recent clinical trials (aTTom and ATLAS) which proved 10 years worth of tamoxifen is superior than 5 years but the patient is inclined to complete only 5 years worth of treatment which I think is reasonable. She completed her Treatment in 2016.Marland Kitchen She has significant osteoporosis. I shared with the data regarding the use of Evista for osteoporosis and prevention of breast cancer She started taking Evista and is doing well I recommend discharge from the clinic She is comfortable with just PCP follow-up and annual mammogram

## 2017-05-14 ENCOUNTER — Other Ambulatory Visit: Payer: Self-pay | Admitting: Internal Medicine

## 2017-05-14 DIAGNOSIS — Z853 Personal history of malignant neoplasm of breast: Secondary | ICD-10-CM

## 2017-06-09 ENCOUNTER — Inpatient Hospital Stay
Admission: RE | Admit: 2017-06-09 | Discharge: 2017-06-09 | Disposition: A | Discharge status: Home / Self Care | Payer: Medicare Other | Source: Ambulatory Visit | Attending: Internal Medicine | Admitting: Internal Medicine

## 2017-07-01 ENCOUNTER — Ambulatory Visit
Admission: RE | Admit: 2017-07-01 | Discharge: 2017-07-01 | Disposition: A | Discharge status: Home / Self Care | Payer: Medicare Other | Source: Ambulatory Visit | Attending: Internal Medicine | Admitting: Internal Medicine

## 2017-07-01 DIAGNOSIS — Z853 Personal history of malignant neoplasm of breast: Secondary | ICD-10-CM

## 2017-09-03 DIAGNOSIS — N3090 Cystitis, unspecified without hematuria: Secondary | ICD-10-CM | POA: Insufficient documentation

## 2018-05-15 DIAGNOSIS — M419 Scoliosis, unspecified: Secondary | ICD-10-CM | POA: Insufficient documentation

## 2018-05-20 DIAGNOSIS — R350 Frequency of micturition: Secondary | ICD-10-CM | POA: Insufficient documentation

## 2018-06-16 DIAGNOSIS — E785 Hyperlipidemia, unspecified: Secondary | ICD-10-CM | POA: Insufficient documentation

## 2018-06-18 ENCOUNTER — Ambulatory Visit (INDEPENDENT_AMBULATORY_CARE_PROVIDER_SITE_OTHER)
Admission: RE | Admit: 2018-06-18 | Discharge: 2018-06-18 | Disposition: A | Discharge status: Home / Self Care | Payer: Medicare Other | Source: Ambulatory Visit | Attending: Family | Admitting: Family

## 2018-06-18 ENCOUNTER — Other Ambulatory Visit (HOSPITAL_COMMUNITY): Payer: Self-pay | Admitting: Internal Medicine

## 2018-06-18 ENCOUNTER — Ambulatory Visit (HOSPITAL_COMMUNITY)
Admission: RE | Admit: 2018-06-18 | Discharge: 2018-06-18 | Disposition: A | Discharge status: Home / Self Care | Payer: Medicare Other | Source: Ambulatory Visit | Attending: Family | Admitting: Family

## 2018-06-18 DIAGNOSIS — I739 Peripheral vascular disease, unspecified: Secondary | ICD-10-CM

## 2018-06-18 DIAGNOSIS — I7092 Chronic total occlusion of artery of the extremities: Secondary | ICD-10-CM | POA: Insufficient documentation

## 2018-06-18 DIAGNOSIS — I70201 Unspecified atherosclerosis of native arteries of extremities, right leg: Secondary | ICD-10-CM | POA: Diagnosis not present

## 2018-06-19 DIAGNOSIS — I739 Peripheral vascular disease, unspecified: Secondary | ICD-10-CM | POA: Insufficient documentation

## 2018-06-26 ENCOUNTER — Ambulatory Visit: Payer: Medicare Other | Admitting: Vascular Surgery

## 2018-06-26 ENCOUNTER — Encounter: Payer: Self-pay | Admitting: Vascular Surgery

## 2018-06-26 ENCOUNTER — Encounter

## 2018-06-26 ENCOUNTER — Other Ambulatory Visit: Payer: Self-pay

## 2018-06-26 VITALS — BP 149/69 | HR 74 | Temp 97.1°F | Resp 16 | Ht 63.0 in | Wt 110.0 lb

## 2018-06-26 DIAGNOSIS — I739 Peripheral vascular disease, unspecified: Secondary | ICD-10-CM

## 2018-06-26 NOTE — Progress Notes (Signed)
Referring Physician: Dr Virgina Jock  Patient name: Shelly [name] MRN: [id number] DOB: 1941-12-21 Sex: female  REASON FOR CONSULT: Right lower extremity claudication  HPI: Shelly [name] is a 77 y.o. female furred for evaluation of right lower extremity claudication.  She has a 46-month history of pain in the right calf after walking about 100 yards.  She also has some pain in her right foot at nighttime that wakes her from sleep.  She does not like to have a sheath placed over her right foot at times and denies numbness or tingling in the foot.  She has no left leg symptoms.  She denies history of atrial fibrillation.  He has a remote smoking history but quit about 30 years ago.  Other medical problems include history of breast cancer multi joint arthritis chronic back pain with occasional right leg numbness and tingling secondary to this.  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  She had a serum creatinine checked recently but I did not have access to this today.  Her serum creatinine was 1.5-1.6 in 2017.  She denies any nonhealing wounds.  Past Medical History:  Diagnosis Date  . Anxiety   . Arthritis    right elbow  . 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 Breast cancer ([place]) 03/2010   left breast s/p lumpectomy; adjuvant radiation; on adjuvant homrnal therapy  . Chronic kidney disease    stage 3  . COPD (chronic obstructive pulmonary disease) ([place])   . Hay fever   . Hypertension   . Hypothyroidism   . Macular degeneration   . Migraine   . MVP (mitral valve prolapse)   . Osteopenia   . PONV (postoperative nausea and vomiting)   . Right arm fracture   . Sciatic pain   . Scoliosis    Past Surgical History:  Procedure Laterality Date  . APPENDECTOMY    . BREAST LUMPECTOMY    . CATARACT EXTRACTION Right   . COLONOSCOPY    . DILATATION & CURETTAGE/HYSTEROSCOPY WITH MYOSURE N/A 05/24/2016   Procedure: DILATATION & CURETTAGE/HYSTEROSCOPY WITH  MYOSURE;  Surgeon: Azucena Fallen, MD;  Location: [facility];  Service: Gynecology;  Laterality: N/A;  400 cc deficit  . DILATION AND CURETTAGE OF UTERUS     uterine polyps  . EYE SURGERY Right    retina  . HERNIA REPAIR    . LAPAROTOMY    . right arm surgery     due to fracture  . TONSILLECTOMY    . TUBAL LIGATION    . UNILATERAL SALPINGECTOMY Left   . UPPER GI ENDOSCOPY    . WISDOM TOOTH EXTRACTION      No family history on file.  SOCIAL HISTORY: Social History   Socioeconomic History  . Marital status: Married    Spouse name: Not on file  . Number of children: Not on file  . Years of education: Not on file  . Highest education level: Not on file  Occupational History  . Not on file  Social Needs  . Financial resource strain: Not on file  . Food insecurity:    Worry: Not on file    Inability: Not on file  . Transportation needs:    Medical: Not on file    Non-medical: Not on file  Tobacco Use  . Smoking status: Former Smoker    Packs/day: 0.50    Years: 25.00    Pack years: 12.50  Last attempt to quit: 08/07/1985    Years since quitting: 32.9  . Smokeless tobacco: Never Used  Substance and Sexual Activity  . Alcohol use: No    Alcohol/week: 0.6 oz    Types: 1 Glasses of wine per week  . Drug use: No  . Sexual activity: Yes    Birth control/protection: Post-menopausal  Lifestyle  . Physical activity:    Days per week: Not on file    Minutes per session: Not on file  . Stress: Not on file  Relationships  . Social connections:    Talks on phone: Not on file    Gets together: Not on file    Attends religious service: Not on file    Active member of club or organization: Not on file    Attends meetings of clubs or organizations: Not on file    Relationship status: Not on file  . Intimate partner violence:    Fear of current or ex partner: Not on file    Emotionally abused: Not on file    Physically abused: Not on file    Forced sexual activity: Not on  file  Other Topics Concern  . Not on file  Social History Narrative  . Not on file    Allergies  Allergen Reactions  . Morphine And Related Nausea And Vomiting  . Amoxicillin     Has patient had a PCN reaction causing immediate rash, facial/tongue/throat swelling, SOB or lightheadedness with hypotension: Yes Has patient had a PCN reaction causing severe rash involving mucus membranes or skin necrosis: Yes Has patient had a PCN reaction that required hospitalization Yes Has patient had a PCN reaction occurring within the last 10 years: No If all of the above answers are "NO", then may proceed with Cephalosporin use.    . Eggs Or Egg-Derived Products Swelling  . Penicillins Other (See Comments)    Febrile, diarrhea,fever Has patient had a PCN reaction causing immediate rash, facial/tongue/throat swelling, SOB or lightheadedness with hypotension: Yes Has patient had a PCN reaction causing severe rash involving mucus membranes or skin necrosis: Yes Has patient had a PCN reaction that required hospitalization Yes Has patient had a PCN reaction occurring within the last 10 years: No If all of the above answers are "NO", then may proceed with Cephalosporin use.   . Lamictal [Lamotrigine] Rash    Current Outpatient Medications  Medication Sig Dispense Refill  . buPROPion (WELLBUTRIN XL) 150 MG 24 hr tablet     . cholecalciferol (VITAMIN D) 1000 UNITS tablet Take 1,000 Units by mouth daily.    Marland Kitchen levothyroxine (SYNTHROID, LEVOTHROID) 75 MCG tablet Take 75 mcg by mouth daily. 50 mcg twice a week and 75 mcg the rest of the week    . losartan (COZAAR) 25 MG tablet Take 25 mg by mouth daily.    Marland Kitchen olmesartan (BENICAR) 20 MG tablet     . raloxifene (EVISTA) 60 MG tablet TAKE ONE TABLET BY MOUTH ONCE DAILY 90 tablet 9  . rosuvastatin (CRESTOR) 10 MG tablet     . traZODone (DESYREL) 50 MG tablet      No current facility-administered medications for this visit.     ROS:   General:  No  weight loss, Fever, chills  HEENT: No recent headaches, no nasal bleeding, no visual changes, no sore throat  Neurologic: No dizziness, blackouts, seizures. No recent symptoms of stroke or mini- stroke. No recent episodes of slurred speech, or temporary blindness.  Cardiac: No recent episodes  of chest pain/pressure, no shortness of breath at rest. + shortness of breath with exertion.  Denies history of atrial fibrillation or irregular heartbeat  Vascular: + history of rest pain in feet.  + history of claudication.  No history of non-healing ulcer, No history of DVT   Pulmonary: No home oxygen, no productive cough, no hemoptysis,  + asthma or wheezing  Musculoskeletal:  [X]  Arthritis, [X]  Low back pain,  [X]  Joint pain  Hematologic:No history of hypercoagulable state.  No history of easy bleeding.  No history of anemia  Gastrointestinal: No hematochezia or melena,  No gastroesophageal reflux, no trouble swallowing  Urinary: [X]  chronic Kidney disease, [ ]  on HD - [ ]  MWF or [ ]  TTHS, [ ]  Burning with urination, [ ]  Frequent urination, [ ]  Difficulty urinating;   Skin: No rashes  Psychological: No history of anxiety,  No history of depression   Physical Examination  Vitals:   06/26/18 1412  BP: (!) 149/69  Pulse: 74  Resp: 16  Temp: (!) 97.1 F (36.2 C)  TempSrc: Oral  SpO2: 100%  Weight: 110 lb (49.9 kg)  Height: 5\' 3"  (1.6 m)    Body mass index is 19.49 kg/m.  General:  Alert and oriented, no acute distress HEENT: Normal Neck: No bruit or JVD Pulmonary: Clear to auscultation bilaterally Cardiac: Regular Rate and Rhythm without murmur Abdomen: Soft, non-tender, non-distended, no mass Skin: No rash, bilateral scattered diffuse varicosities Extremity Pulses:  2+ radial, brachial, femoral,2+ plus left popliteal 2+ left dorsalis pedis absent posterior tibial absent popliteal and pedal pulses right leg  Musculoskeletal: No deformity or edema  Neurologic: Upper and lower  extremity motor 5/5 and symmetric  DATA:  She had bilateral ABIs performed June 18, 2018.  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  She currently has claudication symptoms and difficult to determine whether or not she has some arthritis or early rest pain her ABIs are suggestive that rest pain probably is not the cause of her right foot pain and possibly neuropathy instead.  I discussed with the patient today the possibility of an intervention for a right SFA occlusion.  However, she has opted currently for conservative management with a walking program at least for a few months.  She will try to walk 30 minutes daily to try to improve collateralization and muscle tolerance.  She will return in 3 months time for bilateral ABIs to see if she has had any improvement in her symptoms and her numbers overall.  If she continues to decline develops rest pain or nonhealing wounds she will return for follow-up sooner.   PLAN: See above  Ruta Hinds, MD [facility] Office: [phone] Pager: [phone]

## 2018-06-29 ENCOUNTER — Other Ambulatory Visit: Payer: Self-pay

## 2018-06-29 DIAGNOSIS — I739 Peripheral vascular disease, unspecified: Secondary | ICD-10-CM

## 2018-07-10 ENCOUNTER — Encounter: Payer: Medicare Other | Admitting: Vascular Surgery

## 2018-07-24 ENCOUNTER — Other Ambulatory Visit: Payer: Self-pay | Admitting: Internal Medicine

## 2018-07-24 DIAGNOSIS — Z1231 Encounter for screening mammogram for malignant neoplasm of breast: Secondary | ICD-10-CM

## 2018-08-18 ENCOUNTER — Ambulatory Visit: Payer: Medicare Other

## 2018-08-26 ENCOUNTER — Ambulatory Visit: Payer: Medicare Other

## 2018-10-01 ENCOUNTER — Ambulatory Visit (HOSPITAL_COMMUNITY)
Admission: RE | Admit: 2018-10-01 | Discharge: 2018-10-01 | Disposition: A | Discharge status: Home / Self Care | Payer: Medicare Other | Source: Ambulatory Visit | Attending: Vascular Surgery | Admitting: Vascular Surgery

## 2018-10-01 ENCOUNTER — Encounter: Payer: Self-pay | Admitting: Vascular Surgery

## 2018-10-01 ENCOUNTER — Ambulatory Visit (INDEPENDENT_AMBULATORY_CARE_PROVIDER_SITE_OTHER): Payer: Medicare Other | Admitting: Vascular Surgery

## 2018-10-01 ENCOUNTER — Other Ambulatory Visit: Payer: Self-pay

## 2018-10-01 VITALS — BP 144/78 | HR 70 | Temp 98.0°F | Resp 18 | Ht 62.0 in | Wt 109.7 lb

## 2018-10-01 DIAGNOSIS — I739 Peripheral vascular disease, unspecified: Secondary | ICD-10-CM

## 2018-10-01 NOTE — Progress Notes (Signed)
Patient is a 77 year old female who returns for follow-up today.  She was last seen July 2019.  At that time she had claudication symptoms in the right leg.  She states that she has increased her walking distance from about 3 blocks to 9 blocks before experiencing right leg claudication symptoms.  She denies rest pain.  She has no nonhealing wounds.  She does have a history of renal insufficiency and her most recent serum creatinine was 1.8.  She also has a history of elevated cholesterol and was started on Crestor recently.  Review of systems: She denies shortness of breath.  She denies chest pain.  Current Outpatient Medications on File Prior to Visit  Medication Sig Dispense Refill  . buPROPion (WELLBUTRIN XL) 150 MG 24 hr tablet     . cholecalciferol (VITAMIN D) 1000 UNITS tablet Take 1,000 Units by mouth daily.    Marland Kitchen levothyroxine (SYNTHROID, LEVOTHROID) 75 MCG tablet Take 75 mcg by mouth daily. 50 mcg twice a week and 75 mcg the rest of the week    . olmesartan (BENICAR) 20 MG tablet     . rosuvastatin (CRESTOR) 10 MG tablet     . traZODone (DESYREL) 50 MG tablet     . losartan (COZAAR) 25 MG tablet Take 25 mg by mouth daily.    . raloxifene (EVISTA) 60 MG tablet TAKE ONE TABLET BY MOUTH ONCE DAILY (Patient not taking: Reported on 10/01/2018) 90 tablet 9   No current facility-administered medications on file prior to visit.     Past Surgical History:  Procedure Laterality Date  . APPENDECTOMY    . BREAST LUMPECTOMY    . CATARACT EXTRACTION Right   . COLONOSCOPY    . DILATATION & CURETTAGE/HYSTEROSCOPY WITH MYOSURE N/A 05/24/2016   Procedure: DILATATION & CURETTAGE/HYSTEROSCOPY WITH MYOSURE;  Surgeon: Azucena Fallen, MD;  Location: [facility];  Service: Gynecology;  Laterality: N/A;  400 cc deficit  . DILATION AND CURETTAGE OF UTERUS     uterine polyps  . EYE SURGERY Right    retina  . HERNIA REPAIR    . LAPAROTOMY    . right arm surgery     due to fracture  . TONSILLECTOMY    . TUBAL  LIGATION    . UNILATERAL SALPINGECTOMY Left   . UPPER GI ENDOSCOPY    . WISDOM TOOTH EXTRACTION     Social History   Socioeconomic History  . Marital status: Married    Spouse name: Not on file  . Number of children: Not on file  . Years of education: Not on file  . Highest education level: Not on file  Occupational History  . Not on file  Social Needs  . Financial resource strain: Not on file  . Food insecurity:    Worry: Not on file    Inability: Not on file  . Transportation needs:    Medical: Not on file    Non-medical: Not on file  Tobacco Use  . Smoking status: Former Smoker    Packs/day: 0.50    Years: 25.00    Pack years: 12.50    Last attempt to quit: 08/07/1985    Years since quitting: 33.1  . Smokeless tobacco: Never Used  Substance and Sexual Activity  . Alcohol use: No    Alcohol/week: 1.0 standard drinks    Types: 1 Glasses of wine per week  . Drug use: No  . Sexual activity: Yes    Birth control/protection: Post-menopausal  Lifestyle  . Physical  activity:    Days per week: Not on file    Minutes per session: Not on file  . Stress: Not on file  Relationships  . Social connections:    Talks on phone: Not on file    Gets together: Not on file    Attends religious service: Not on file    Active member of club or organization: Not on file    Attends meetings of clubs or organizations: Not on file    Relationship status: Not on file  . Intimate partner violence:    Fear of current or ex partner: Not on file    Emotionally abused: Not on file    Physically abused: Not on file    Forced sexual activity: Not on file  Other Topics Concern  . Not on file  Social History Narrative  . Not on file    Physical exam:  Vitals:   10/01/18 1233  BP: (!) 144/78  Pulse: 70  Resp: 18  Temp: 98 F (36.7 C)  TempSrc: Oral  SpO2: 100%  Weight: 109 lb 11.2 oz (49.8 kg)  Height: 5\' 2"  (1.575 m)    Neck: No carotid bruits  Chest: Clear to  auscultation  Cardiac: Regular rate and rhythm  Abdomen: Soft nontender no mass  Extremities: 2+ femoral pulses bilaterally 2+ left dorsalis pedis posterior tibial pulse absent pedal pulses right foot  Skin: Intact no ulcers  Data: Patient had bilateral ABIs performed today.  Right side was 0.6 left side was 1.05 essentially unchanged from 3 months ago  Assessment: Patient was stable 9 block claudication right leg.  I discussed with her the possibility of arteriogram lower extremity runoff possible intervention discussing the risks and benefits especially in light of her abnormal renal function.  Although she does not believe her walking distance is perfect she believes it is tolerable and is satisfied currently with using an exercise program to try to encourage her walking distance to improve.  Plan: The patient will follow-up in 1 year with repeat ABIs.  We will see our nurse practitioner at that office visit  Ruta Hinds, MD [facility] Office: [phone] Pager: [phone]

## 2018-10-12 DIAGNOSIS — R059 Cough, unspecified: Secondary | ICD-10-CM | POA: Insufficient documentation

## 2018-11-02 ENCOUNTER — Ambulatory Visit
Admission: RE | Admit: 2018-11-02 | Discharge: 2018-11-02 | Disposition: A | Discharge status: Home / Self Care | Payer: Medicare Other | Source: Ambulatory Visit | Attending: Internal Medicine | Admitting: Internal Medicine

## 2018-11-02 DIAGNOSIS — Z1231 Encounter for screening mammogram for malignant neoplasm of breast: Secondary | ICD-10-CM

## 2019-05-10 ENCOUNTER — Other Ambulatory Visit: Payer: Self-pay | Admitting: Nephrology

## 2019-05-10 DIAGNOSIS — N184 Chronic kidney disease, stage 4 (severe): Secondary | ICD-10-CM

## 2019-05-14 ENCOUNTER — Ambulatory Visit
Admission: RE | Admit: 2019-05-14 | Discharge: 2019-05-14 | Disposition: A | Discharge status: Home / Self Care | Payer: Medicare Other | Source: Ambulatory Visit | Attending: Nephrology | Admitting: Nephrology

## 2019-05-14 DIAGNOSIS — N184 Chronic kidney disease, stage 4 (severe): Secondary | ICD-10-CM

## 2019-06-21 DIAGNOSIS — R636 Underweight: Secondary | ICD-10-CM | POA: Insufficient documentation

## 2019-06-21 DIAGNOSIS — D649 Anemia, unspecified: Secondary | ICD-10-CM | POA: Insufficient documentation

## 2019-06-21 DIAGNOSIS — R600 Localized edema: Secondary | ICD-10-CM | POA: Insufficient documentation

## 2019-08-17 ENCOUNTER — Encounter (HOSPITAL_COMMUNITY): Payer: Self-pay | Admitting: Urgent Care

## 2019-08-17 ENCOUNTER — Ambulatory Visit (HOSPITAL_COMMUNITY)
Admission: EM | Admit: 2019-08-17 | Discharge: 2019-08-17 | Disposition: A | Discharge status: Home / Self Care | Payer: Medicare Other | Attending: Family Medicine | Admitting: Family Medicine

## 2019-08-17 DIAGNOSIS — R002 Palpitations: Secondary | ICD-10-CM | POA: Diagnosis present

## 2019-08-17 DIAGNOSIS — I10 Essential (primary) hypertension: Secondary | ICD-10-CM | POA: Diagnosis present

## 2019-08-17 LAB — TSH: TSH: 0.615 u[IU]/mL (ref 0.350–4.500)

## 2019-08-17 LAB — BASIC METABOLIC PANEL
Anion gap: 13 (ref 5–15)
BUN: 36 mg/dL — ABNORMAL HIGH (ref 8–23)
CO2: 22 mmol/L (ref 22–32)
Calcium: 8.6 mg/dL — ABNORMAL LOW (ref 8.9–10.3)
Chloride: 102 mmol/L (ref 98–111)
Creatinine, Ser: 2.2 mg/dL — ABNORMAL HIGH (ref 0.44–1.00)
GFR calc Af Amer: 24 mL/min — ABNORMAL LOW (ref >60)
GFR calc non Af Amer: 20 mL/min — ABNORMAL LOW (ref >60)
Glucose, Bld: 102 mg/dL — ABNORMAL HIGH (ref 70–99)
Potassium: 3.7 mmol/L (ref 3.5–5.1)
Sodium: 137 mmol/L (ref 135–145)

## 2019-08-17 LAB — CBC
HCT: 37.4 % (ref 36.0–46.0)
Hemoglobin: 11.9 g/dL — ABNORMAL LOW (ref 12.0–15.0)
MCH: 29.3 pg (ref 26.0–34.0)
MCHC: 31.8 g/dL (ref 30.0–36.0)
MCV: 92.1 fL (ref 80.0–100.0)
Platelets: 224 10*3/uL (ref 150–400)
RBC: 4.06 MIL/uL (ref 3.87–5.11)
RDW: 12.3 % (ref 11.5–15.5)
WBC: 7.2 10*3/uL (ref 4.0–10.5)
nRBC: 0 % (ref 0.0–0.2)

## 2019-08-17 NOTE — Discharge Instructions (Addendum)
You have been seen at the [facility] today for two episodes of feeling your heart beat very fast. Your evaluation today was not suggestive of any emergent condition requiring medical intervention at this time. Your ECG (heart tracing) did show some changes when compared to one performed in 2017. Please call your primary doctor to discuss cardiology evaluation.  Given this, it's very important that you pay attention to any new symptoms or worsening of your current condition.  Please proceed directly to the Emergency Department immediately should you feel worse in any way or have any of the following symptoms: chest pain, pain that spreads to your arm, neck, jaw, back or abdomen, shortness of breath, or nausea and vomiting.

## 2019-08-17 NOTE — ED Provider Notes (Signed)
[place]   [id number] 08/17/19 Arrival Time: 4098  ASSESSMENT & PLAN:  1. Essential hypertension   2. Palpitations      ECG: Performed today and interpreted by me: Sinus rhythm with single PVC. She does inverted T waves in inferior/lateral leads that are new when compared to ECG dated 22 May 2016. Completely asymptomatic while here.  Awaiting blood work now. She prefers to go home. We will call with any significant findings. She plans to call her PCP in the morning to discuss further evaluation and cardiology evaluation.    Discharge Instructions     You have been seen at the [facility] today for two episodes of feeling your heart beat very fast. Your evaluation today was not suggestive of any emergent condition requiring medical intervention at this time. Your ECG (heart tracing) did show some changes when compared to one performed in 2017. Please call your primary doctor to discuss cardiology evaluation.  Given this, it's very important that you pay attention to any new symptoms or worsening of your current condition.  Please proceed directly to the Emergency Department immediately should you feel worse in any way or have any of the following symptoms: chest pain, pain that spreads to your arm, neck, jaw, back or abdomen, shortness of breath, or nausea and vomiting.       Follow-up Information    Schedule an appointment as soon as possible for a visit  with Shon Baton, MD.   Specialty: Internal Medicine Contact information: [place] [address] [phone]        [facility] EM[facility].   Specialty: Emergency Medicine Why: If symptoms worsen in any way. Contact information: [address] [id number] [place] [phone]          Reviewed expectations re: course of current medical issues. Questions answered. Outlined signs and symptoms indicating need  for more acute intervention. Patient verbalized understanding. After Visit Summary given.   SUBJECTIVE:  CC: Hypertension  History from: patient. BRET STAMOUR is a 78 y.o. female with chronic kidney disease and COPD who presents with worries over her blood pressure. Checks daily. "Has been a little high." Also reports two "spells" over the past couple of days. Describes sensation of a racing heart with feeling flushed. [place] episode [place]. BP a little high. "Did and EKG and everything looked ok." Symptoms resolved. This morning reports feeling the same; lasted about one hour; "not as bad as the first episode". No associated CP/SOB/n/v. Otherwise feeling well. Taking Rx medications as directed. No recent illnesses. Afebrile. Normal PO intake. Ambulatory without difficulty. No recent injuries or stressful activities. No specific aggravating or alleviating factors reported when symptoms present. No OTC medications taken.  Social History   Tobacco Use  Smoking Status Former Smoker  . Packs/day: 0.50  . Years: 25.00  . Pack years: 12.50  . Quit date: 08/07/1985  . Years since quitting: 34.0  Smokeless Tobacco Never Used   ROS: As per HPI. All other systems negative.   OBJECTIVE:  Vitals:   08/17/19 1643  BP: (!) 133/97  Pulse: 100  Resp: 18  Temp: 97.8 F (36.6 C)  SpO2: 99%    General appearance: alert, oriented, no acute distress Eyes: PERRLA; EOMI; conjunctivae normal HENT: normocephalic; atraumatic Neck: supple with FROM Lungs: without labored respirations; CTAB Heart: regular rate and rhythm with systolic murmer Chest Wall: without tenderness  to palpation Abdomen: soft, non-tender Extremities: without edema; without calf swelling or tenderness; symmetrical without gross deformities Skin: warm and dry; without rash or lesions Psychological: alert and cooperative; normal mood and affect  Investigations Pending: Labs Reviewed   CBC  BASIC METABOLIC PANEL  TSH    Allergies  Allergen Reactions  . Morphine And Related Nausea And Vomiting  . Amoxicillin     Has patient had a PCN reaction causing immediate rash, facial/tongue/throat swelling, SOB or lightheadedness with hypotension: Yes Has patient had a PCN reaction causing severe rash involving mucus membranes or skin necrosis: Yes Has patient had a PCN reaction that required hospitalization Yes Has patient had a PCN reaction occurring within the last 10 years: No If all of the above answers are "NO", then may proceed with Cephalosporin use.    . Eggs Or Egg-Derived Products Swelling  . Penicillins Other (See Comments)    Febrile, diarrhea,fever Has patient had a PCN reaction causing immediate rash, facial/tongue/throat swelling, SOB or lightheadedness with hypotension: Yes Has patient had a PCN reaction causing severe rash involving mucus membranes or skin necrosis: Yes Has patient had a PCN reaction that required hospitalization Yes Has patient had a PCN reaction occurring within the last 10 years: No If all of the above answers are "NO", then may proceed with Cephalosporin use.   . Lamictal [Lamotrigine] Rash    Past Medical History:  Diagnosis Date  . Anxiety   . Arthritis    right elbow  . 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 Breast cancer ([place]) 03/2010   left breast s/p lumpectomy; adjuvant radiation; on adjuvant homrnal therapy  . Chronic kidney disease    stage 3  . COPD (chronic obstructive pulmonary disease) ([place])   . Hay fever   . Hypertension   . Hypothyroidism   . Macular degeneration   . Migraine   . MVP (mitral valve prolapse)   . Osteopenia   . PONV (postoperative nausea and vomiting)   . Right arm fracture   . Sciatic pain   . Scoliosis    Social History   Socioeconomic History  . Marital status: Married    Spouse name: Not on file  . Number of children: Not on file  . Years of education: Not on file  .  Highest education level: Not on file  Occupational History  . Not on file  Social Needs  . Financial resource strain: Not on file  . Food insecurity    Worry: Not on file    Inability: Not on file  . Transportation needs    Medical: Not on file    Non-medical: Not on file  Tobacco Use  . Smoking status: Former Smoker    Packs/day: 0.50    Years: 25.00    Pack years: 12.50    Quit date: 08/07/1985    Years since quitting: 34.0  . Smokeless tobacco: Never Used  Substance and Sexual Activity  . Alcohol use: No    Alcohol/week: 1.0 standard drinks    Types: 1 Glasses of wine per week  . Drug use: No  . Sexual activity: Yes    Birth control/protection: Post-menopausal  Lifestyle  . Physical activity    Days per week: Not on file    Minutes per session: Not on file  . Stress: Not on file  Relationships  . Social Herbalist on phone: Not on file    Gets together: Not on file  Attends religious service: Not on file    Active member of club or organization: Not on file    Attends meetings of clubs or organizations: Not on file    Relationship status: Not on file  . Intimate partner violence    Fear of current or ex partner: Not on file    Emotionally abused: Not on file    Physically abused: Not on file    Forced sexual activity: Not on file  Other Topics Concern  . Not on file  Social History Narrative  . Not on file   FH: HTN  Past Surgical History:  Procedure Laterality Date  . APPENDECTOMY    . BREAST BIOPSY    . BREAST LUMPECTOMY    . CATARACT EXTRACTION Right   . COLONOSCOPY    . DILATATION & CURETTAGE/HYSTEROSCOPY WITH MYOSURE N/A 05/24/2016   Procedure: DILATATION & CURETTAGE/HYSTEROSCOPY WITH MYOSURE;  Surgeon: Azucena Fallen, MD;  Location: [facility];  Service: Gynecology;  Laterality: N/A;  400 cc deficit  . DILATION AND CURETTAGE OF UTERUS     uterine polyps  . EYE SURGERY Right    retina  . HERNIA REPAIR    . LAPAROTOMY    . right arm surgery      due to fracture  . TONSILLECTOMY    . TUBAL LIGATION    . UNILATERAL SALPINGECTOMY Left   . UPPER GI ENDOSCOPY    . WISDOM TOOTH EXTRACTION       Vanessa Kick, MD 08/17/19 [phone]

## 2019-08-17 NOTE — ED Triage Notes (Addendum)
Pt states shes been having trouble with her blood pressure, pt states "its been up higher than usual". Pt states she woke up yesterday morning around 4am "my heart was racing and felt unsteady". Pt states she did not check her blood pressure. Pt states they called ems out and her BP was 169/83. They did an ekg without any acute abnormalities. Pt states she also had a "spell" this morning. Was not as bad as the day before. Pt states her symptoms go away after each "spell"

## 2019-08-20 ENCOUNTER — Telehealth (HOSPITAL_COMMUNITY): Payer: Self-pay | Admitting: Emergency Medicine

## 2019-08-20 NOTE — Telephone Encounter (Signed)
Per Dr. Mannie Stabile, labs unremarkable. Pt has follow up appt with pcp on Monday. All questions answered.

## 2019-08-24 ENCOUNTER — Other Ambulatory Visit (HOSPITAL_COMMUNITY): Payer: Self-pay | Admitting: Internal Medicine

## 2019-08-24 DIAGNOSIS — R9431 Abnormal electrocardiogram [ECG] [EKG]: Secondary | ICD-10-CM

## 2019-08-24 DIAGNOSIS — I493 Ventricular premature depolarization: Secondary | ICD-10-CM

## 2019-08-27 ENCOUNTER — Ambulatory Visit (HOSPITAL_COMMUNITY): Payer: Medicare Other | Attending: Cardiovascular Disease

## 2019-08-27 ENCOUNTER — Other Ambulatory Visit: Payer: Self-pay

## 2019-08-27 DIAGNOSIS — R9431 Abnormal electrocardiogram [ECG] [EKG]: Secondary | ICD-10-CM | POA: Diagnosis present

## 2019-08-27 DIAGNOSIS — I493 Ventricular premature depolarization: Secondary | ICD-10-CM

## 2019-08-31 DIAGNOSIS — I5022 Chronic systolic (congestive) heart failure: Secondary | ICD-10-CM | POA: Insufficient documentation

## 2019-09-01 DIAGNOSIS — Z961 Presence of intraocular lens: Secondary | ICD-10-CM | POA: Insufficient documentation

## 2019-09-02 ENCOUNTER — Encounter (HOSPITAL_COMMUNITY): Payer: Self-pay | Admitting: *Deleted

## 2019-09-02 ENCOUNTER — Emergency Department (HOSPITAL_COMMUNITY): Payer: Medicare Other

## 2019-09-02 ENCOUNTER — Encounter: Payer: Self-pay | Admitting: Cardiovascular Disease

## 2019-09-02 ENCOUNTER — Telehealth: Payer: Self-pay | Admitting: *Deleted

## 2019-09-02 ENCOUNTER — Emergency Department (HOSPITAL_COMMUNITY)
Admission: EM | Admit: 2019-09-02 | Discharge: 2019-09-02 | Disposition: A | Discharge status: Home / Self Care | Payer: Medicare Other | Source: Home / Self Care | Attending: Emergency Medicine | Admitting: Emergency Medicine

## 2019-09-02 ENCOUNTER — Other Ambulatory Visit: Payer: Self-pay

## 2019-09-02 DIAGNOSIS — E039 Hypothyroidism, unspecified: Secondary | ICD-10-CM | POA: Insufficient documentation

## 2019-09-02 DIAGNOSIS — R110 Nausea: Secondary | ICD-10-CM | POA: Insufficient documentation

## 2019-09-02 DIAGNOSIS — Z7982 Long term (current) use of aspirin: Secondary | ICD-10-CM | POA: Insufficient documentation

## 2019-09-02 DIAGNOSIS — I130 Hypertensive heart and chronic kidney disease with heart failure and stage 1 through stage 4 chronic kidney disease, or unspecified chronic kidney disease: Secondary | ICD-10-CM | POA: Diagnosis not present

## 2019-09-02 DIAGNOSIS — J449 Chronic obstructive pulmonary disease, unspecified: Secondary | ICD-10-CM | POA: Insufficient documentation

## 2019-09-02 DIAGNOSIS — F319 Bipolar disorder, unspecified: Secondary | ICD-10-CM | POA: Insufficient documentation

## 2019-09-02 DIAGNOSIS — N183 Chronic kidney disease, stage 3 (moderate): Secondary | ICD-10-CM | POA: Insufficient documentation

## 2019-09-02 DIAGNOSIS — Z87891 Personal history of nicotine dependence: Secondary | ICD-10-CM | POA: Insufficient documentation

## 2019-09-02 DIAGNOSIS — Z79899 Other long term (current) drug therapy: Secondary | ICD-10-CM | POA: Insufficient documentation

## 2019-09-02 DIAGNOSIS — I129 Hypertensive chronic kidney disease with stage 1 through stage 4 chronic kidney disease, or unspecified chronic kidney disease: Secondary | ICD-10-CM | POA: Insufficient documentation

## 2019-09-02 DIAGNOSIS — R002 Palpitations: Secondary | ICD-10-CM | POA: Insufficient documentation

## 2019-09-02 DIAGNOSIS — Z853 Personal history of malignant neoplasm of breast: Secondary | ICD-10-CM | POA: Insufficient documentation

## 2019-09-02 DIAGNOSIS — R42 Dizziness and giddiness: Secondary | ICD-10-CM | POA: Insufficient documentation

## 2019-09-02 LAB — CBC WITH DIFFERENTIAL/PLATELET
Abs Immature Granulocytes: 0.01 10*3/uL (ref 0.00–0.07)
Basophils Absolute: 0.1 10*3/uL (ref 0.0–0.1)
Basophils Relative: 1 %
Eosinophils Absolute: 0.2 10*3/uL (ref 0.0–0.5)
Eosinophils Relative: 3 %
HCT: 37.5 % (ref 36.0–46.0)
Hemoglobin: 11.6 g/dL — ABNORMAL LOW (ref 12.0–15.0)
Immature Granulocytes: 0 %
Lymphocytes Relative: 23 %
Lymphs Abs: 1.3 10*3/uL (ref 0.7–4.0)
MCH: 28.7 pg (ref 26.0–34.0)
MCHC: 30.9 g/dL (ref 30.0–36.0)
MCV: 92.8 fL (ref 80.0–100.0)
Monocytes Absolute: 0.7 10*3/uL (ref 0.1–1.0)
Monocytes Relative: 12 %
Neutro Abs: 3.6 10*3/uL (ref 1.7–7.7)
Neutrophils Relative %: 61 %
Platelets: 240 10*3/uL (ref 150–400)
RBC: 4.04 MIL/uL (ref 3.87–5.11)
RDW: 12.2 % (ref 11.5–15.5)
WBC: 5.9 10*3/uL (ref 4.0–10.5)
nRBC: 0 % (ref 0.0–0.2)

## 2019-09-02 LAB — BASIC METABOLIC PANEL
Anion gap: 10 (ref 5–15)
BUN: 32 mg/dL — ABNORMAL HIGH (ref 8–23)
CO2: 23 mmol/L (ref 22–32)
Calcium: 9.1 mg/dL (ref 8.9–10.3)
Chloride: 107 mmol/L (ref 98–111)
Creatinine, Ser: 1.79 mg/dL — ABNORMAL HIGH (ref 0.44–1.00)
GFR calc Af Amer: 30 mL/min — ABNORMAL LOW (ref >60)
GFR calc non Af Amer: 26 mL/min — ABNORMAL LOW (ref >60)
Glucose, Bld: 120 mg/dL — ABNORMAL HIGH (ref 70–99)
Potassium: 3.7 mmol/L (ref 3.5–5.1)
Sodium: 140 mmol/L (ref 135–145)

## 2019-09-02 LAB — MAGNESIUM: Magnesium: 2.2 mg/dL (ref 1.7–2.4)

## 2019-09-02 MED ORDER — CARVEDILOL 3.125 MG PO TABS: 3.1250 mg | ORAL_TABLET | Freq: Two times a day (BID) | ORAL | 0 refills | Status: DC

## 2019-09-02 NOTE — Progress Notes (Signed)
Cardiology Office Note:    Date:  09/03/2019   ID:  Shelly [name], DOB 1941/07/28, MRN [id number]  PCP:  Shon Baton, MD  Cardiologist:  Nahser  Electrophysiologist:  None   Referring MD: Shon Baton, MD   Chief Complaint  Patient presents with  . Congestive Heart Failure    History of Present Illness:    Shelly [name] is a 78 y.o. female with a hx of CHF. She was seen in the [facility] ER yesterday  am and was scheduled for an appt with the DOD the following day .  Echo on Sept. 4 revealed EF of 30-35%.   Grade 1 diastolic dysfunction.  Moderate pulmonary HTN with PA pressure of 56 mmHg. Mild - mod MR   We were asked to see her by Dr. Virgina Jock for further evaluation of sudden onset of rapid HR .   [place] the last [place] in [place]  Woke up , could not breath  Felt weak.   Called EMS.   EMS did an ECG but HR had slowed by that time .   Came back to [name] to urgent care. .  ECG showed marked TWI ,  Felt fine,  Did not get admitted.   Echo was ordered and showed EF of 30-35%.  , moderate hypokinesis of anterior wall  PA pressure of 56 mmHg.  Was stared on Coreg 3. 125 BID   Has had some tightness in her chest for most of this summer. Really has not felt well Has been getting tired early . Has tightness in her chest with walking .   Would typically resolve when she stopped to rest.    She is had several episodes at night where she has very fast and rapid heart rate irregularities.  These are associated with intense chest pain and lightheadedness.  Clinically these could be consistent with ventricular tachycardia.    Past Medical History:  Diagnosis Date  . Anxiety   . Arthritis    right elbow  . 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 Breast cancer ([place]) 03/2010   left breast s/p lumpectomy; adjuvant radiation; on adjuvant homrnal therapy  . Chronic kidney disease    stage 3  . COPD (chronic obstructive pulmonary disease) ([place])   . Hay  fever   . History of breast cancer 09/29/2012  . Hypertension   . Hypothyroidism   . Macular degeneration   . Migraine   . MVP (mitral valve prolapse)   . Osteopenia   . PONV (postoperative nausea and vomiting)   . Right arm fracture   . Sciatic pain   . Scoliosis     Past Surgical History:  Procedure Laterality Date  . APPENDECTOMY    . BREAST BIOPSY    . BREAST LUMPECTOMY    . CATARACT EXTRACTION Right   . COLONOSCOPY    . DILATATION & CURETTAGE/HYSTEROSCOPY WITH MYOSURE N/A 05/24/2016   Procedure: DILATATION & CURETTAGE/HYSTEROSCOPY WITH MYOSURE;  Surgeon: Azucena Fallen, MD;  Location: [facility];  Service: Gynecology;  Laterality: N/A;  400 cc deficit  . DILATION AND CURETTAGE OF UTERUS     uterine polyps  . EYE SURGERY Right    retina  . HERNIA REPAIR    . LAPAROTOMY    . right arm surgery     due to fracture  . TONSILLECTOMY    . TUBAL LIGATION    . UNILATERAL SALPINGECTOMY Left   .  UPPER GI ENDOSCOPY    . WISDOM TOOTH EXTRACTION      Current Medications: Current Meds  Medication Sig  . aspirin EC 81 MG tablet Take 81 mg by mouth daily.  . Biotin 1 MG CAPS Take 1 capsule by mouth daily.  Marland Kitchen buPROPion (WELLBUTRIN XL) 150 MG 24 hr tablet Take 150 mg by mouth every morning.   . carvedilol (COREG) 3.125 MG tablet Take 1 tablet (3.125 mg total) by mouth 2 (two) times daily with a meal.  . cetirizine (ZYRTEC) 10 MG tablet Take 10 mg by mouth daily.  . cholecalciferol (VITAMIN D) 1000 UNITS tablet Take 1,000 Units by mouth daily.  . furosemide (LASIX) 20 MG tablet Take 10 mg by mouth every morning.   Marland Kitchen levothyroxine (SYNTHROID, LEVOTHROID) 75 MCG tablet Take 50-75 mcg by mouth daily. 50 mcg on Saturday and Sunday and 75 mcg the rest of the week  . Multiple Vitamins-Minerals (PRESERVISION/LUTEIN) CAPS Take 1 capsule by mouth every morning.   . rosuvastatin (CRESTOR) 10 MG tablet Take 10 mg by mouth every morning.      Allergies:   Morphine and related, Amoxicillin, Eggs or  egg-derived products, Penicillins, and Lamictal [lamotrigine]   Social History   Socioeconomic History  . Marital status: Married    Spouse name: Not on file  . Number of children: Not on file  . Years of education: Not on file  . Highest education level: Not on file  Occupational History  . Not on file  Social Needs  . Financial resource strain: Not on file  . Food insecurity    Worry: Not on file    Inability: Not on file  . Transportation needs    Medical: Not on file    Non-medical: Not on file  Tobacco Use  . Smoking status: Former Smoker    Packs/day: 0.50    Years: 25.00    Pack years: 12.50    Quit date: 08/07/1985    Years since quitting: 34.0  . Smokeless tobacco: Never Used  Substance and Sexual Activity  . Alcohol use: No    Alcohol/week: 1.0 standard drinks    Types: 1 Glasses of wine per week  . Drug use: No  . Sexual activity: Yes    Birth control/protection: Post-menopausal  Lifestyle  . Physical activity    Days per week: Not on file    Minutes per session: Not on file  . Stress: Not on file  Relationships  . Social Herbalist on phone: Not on file    Gets together: Not on file    Attends religious service: Not on file    Active member of club or organization: Not on file    Attends meetings of clubs or organizations: Not on file    Relationship status: Not on file  Other Topics Concern  . Not on file  Social History Narrative  . Not on file     Family History: The patient's family history is not on file.  ROS:   Please see the history of present illness.     All other systems reviewed and are negative.  EKGs/Labs/Other Studies Reviewed:    The following studies were reviewed today:   EKG: ECG from 2017 is essentially normal.   ECG from August 17, 2019 shows evolving anterior wall myocardial infarction with T wave inversions in the inferior and lateral leads.  ECG from September 01, 2019 shows diffuse T wave inversions in  the  inferior, anterior and lateral leads consistent with evolving large anterior wall myocardial infarction.  Recent Labs: 08/17/2019: TSH 0.615 09/02/2019: BUN 32; Creatinine, Ser 1.79; Hemoglobin 11.6; Magnesium 2.2; Platelets 240; Potassium 3.7; Sodium 140  Recent Lipid Panel No results found for: CHOL, TRIG, HDL, CHOLHDL, VLDL, LDLCALC, LDLDIRECT  Physical Exam:    VS:  BP (!) 152/78   Pulse 91   Ht 5\' 5"  (1.651 m)   Wt 110 lb 6.4 oz (50.1 kg)   SpO2 97%   BMI 18.37 kg/m     Wt Readings from Last 3 Encounters:  09/03/19 110 lb 6.4 oz (50.1 kg)  09/02/19 109 lb 12.6 oz (49.8 kg)  10/01/18 109 lb 11.2 oz (49.8 kg)     GEN:  Well nourished, well developed in no acute distress HEENT: Normal NECK: No JVD; No carotid bruits LYMPHATICS: No lymphadenopathy CARDIAC: RRR, , 3/6 systolic ejection murmur radiating to the axilla.  She has a bounding PMI. RESPIRATORY:  Clear to auscultation without rales, wheezing or rhonchi  ABDOMEN: Soft, non-tender, non-distended MUSCULOSKELETAL: 1+ leg edema. SKIN: Warm and dry NEUROLOGIC:  Alert and oriented x 3 PSYCHIATRIC:  Normal affect   ASSESSMENT:    1. Acute MI, anterior wall ([place])   2. Acute on chronic combined systolic and diastolic CHF (congestive heart failure) ([place])    PLAN:    In order of problems listed above:  1. Coronary artery disease: The patient presents with symptoms and signs consistent with recent anterior wall myocardial infarction.  She has had episodes of angina all summer long.  She had an acute episode/arrhythmia in late August while up in the mountains.  She returned to [facility] and was seen in our emergency room on August 25.  At that time she had an EKG consistent with an evolving anterior wall myocardial infarction.  Troponin levels were not drawn.  She was feeling better at that time so she was discharged to home.  Echocardiogram was ordered and was performed on September 4.  She was found to have anteroapical  severe hypokinesis with an ejection fraction of 30%.  She had moderate to severe LV dilatation.  She has at least mild to moderate mitral regurgitation.  She had recurrent symptoms of arrhythmia and severe chest pain and shortness of breath on September 10 and return to the emergency room.  She was feeling better after some time and was once again emergency room.  Troponin levels were not drawn at that time.  She is now seen in the office with the history as noted.  I suspect that she is had a recent demise approximately 3 weeks ago and now has congestive heart failure and likely ventricular tachycardia following that myocardial infarction.  She has ankle edema that seems to have worsened over the past several weeks.  She needs to be admitted to the hospital.  We will gently diurese her.  We will need to be careful because she has some underlying chronic kidney disease.  She needs to be started on IV heparin, IV nitroglycerin.  She needs additional beta-blocker.  She needs to be scheduled for heart catheterization-likely on Monday.  We will need to watch her creatinine very closely.  I discussed the risk, benefits, options of heart catheterization with her.  She understands and agrees to proceed.   2.  Hypertension: She is been on amlodipine.  The amlodipine has caused some mild leg edema but this leg edema has worsened over the past several weeks.  Medication Adjustments/Labs and Tests Ordered: Current medicines are reviewed at length with the patient today.  Concerns regarding medicines are outlined above.  No orders of the defined types were placed in this encounter.  No orders of the defined types were placed in this encounter.   Patient Instructions  Medication Instructions:  Your physician recommends that you continue on your current medications as directed. Please refer to the Current Medication list given to you today.  If you need a refill on your cardiac medications before  your next appointment, please call your pharmacy.   Lab work: None Ordered  Testing/Procedures: None Ordered  Any Other Special Instructions Will Be Listed Below (If Applicable). Dr. Acie Fredrickson would like you to go directly to the Emergency Room.      Signed, Mertie Moores, MD  09/03/2019 5:13 PM    [facility]

## 2019-09-02 NOTE — Telephone Encounter (Signed)
Return call to patient who had questions about "having some heart issues and if related to PAD". I informed her that  Indeed if there is blood flow issues in peripheral arteries there can be blood flow issues in coronary arteries. However, this my not be the "heart issue" she is having and she needs to speak with her Cardiologist to get all questions answered. She is a yearly survailence patient here and to call this office for any peripheral vascular issues if needs to be seen sooner.

## 2019-09-02 NOTE — ED Provider Notes (Signed)
[place] EMERGENCY DEPARTMENT Provider Note   CSN: [id number] Arrival date & time: 09/02/19  0344     History   Chief Complaint Chief Complaint  Patient presents with  . Irregular Heart Beat    HPI Shelly [name] is a 78 y.o. female with history of hypertension, hypothyroidism, CKD stage III, COPD who presents with palpitations.  Patient reports she has been getting palpitations intermittently since the end of August.  She was evaluated at urgent care and then told to follow-up with her doctor, which she has.  She has been getting episodes like this every day.  She reports she feels like her heart is racing.  She has some lightheadedness with.  She also states that she has to move her bowels a lot when this happens.  She denies any chest pain or shortness of breath, abdominal pain, vomiting.  However, she has had some nausea during the episodes.  She reports the episode started around 2 AM this morning and started coming down around 3 AM when EMS arrived.     HPI  Past Medical History:  Diagnosis Date  . Anxiety   . Arthritis    right elbow  . 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 Breast cancer ([place]) 03/2010   left breast s/p lumpectomy; adjuvant radiation; on adjuvant homrnal therapy  . Chronic kidney disease    stage 3  . COPD (chronic obstructive pulmonary disease) ([place])   . Hay fever   . Hypertension   . Hypothyroidism   . Macular degeneration   . Migraine   . MVP (mitral valve prolapse)   . Osteopenia   . PONV (postoperative nausea and vomiting)   . Right arm fracture   . Sciatic pain   . Scoliosis     Patient Active Problem List   Diagnosis Date Noted  . Osteoporosis 07/03/2015  . History of breast cancer 09/29/2012    Past Surgical History:  Procedure Laterality Date  . APPENDECTOMY    . BREAST BIOPSY    . BREAST LUMPECTOMY    . CATARACT EXTRACTION Right   . COLONOSCOPY    . DILATATION & CURETTAGE/HYSTEROSCOPY WITH MYOSURE  N/A 05/24/2016   Procedure: DILATATION & CURETTAGE/HYSTEROSCOPY WITH MYOSURE;  Surgeon: Azucena Fallen, MD;  Location: [facility];  Service: Gynecology;  Laterality: N/A;  400 cc deficit  . DILATION AND CURETTAGE OF UTERUS     uterine polyps  . EYE SURGERY Right    retina  . HERNIA REPAIR    . LAPAROTOMY    . right arm surgery     due to fracture  . TONSILLECTOMY    . TUBAL LIGATION    . UNILATERAL SALPINGECTOMY Left   . UPPER GI ENDOSCOPY    . WISDOM TOOTH EXTRACTION       OB History   No obstetric history on file.      Home Medications    Prior to Admission medications   Medication Sig Start Date End Date Taking? Authorizing Provider  aspirin EC 81 MG tablet Take 81 mg by mouth daily.   Yes [provider]  Biotin 1 MG CAPS Take 1 capsule by mouth daily.   Yes [provider]  buPROPion (WELLBUTRIN XL) 150 MG 24 hr tablet Take 150 mg by mouth every morning.  06/16/18  Yes [provider]  cetirizine (ZYRTEC) 10 MG tablet Take 10 mg by mouth daily.   Yes [provider]  cholecalciferol (VITAMIN  D) 1000 UNITS tablet Take 1,000 Units by mouth daily.   Yes [provider]  furosemide (LASIX) 20 MG tablet Take 10 mg by mouth every morning.    Yes [provider]  levothyroxine (SYNTHROID, LEVOTHROID) 75 MCG tablet Take 50-75 mcg by mouth daily. 50 mcg on Saturday and Sunday and 75 mcg the rest of the week   Yes [provider]  Multiple Vitamins-Minerals (PRESERVISION/LUTEIN) CAPS Take 1 capsule by mouth every morning.    Yes [provider]  rosuvastatin (CRESTOR) 10 MG tablet Take 10 mg by mouth every morning.  06/20/18  Yes [provider]  carvedilol (COREG) 3.125 MG tablet Take 1 tablet (3.125 mg total) by mouth 2 (two) times daily with a meal. 09/02/19   Aften Lipsey M, PA-C  losartan (COZAAR) 25 MG tablet Take 25 mg by mouth daily. 04/17/16 08/17/19  [provider]  olmesartan (BENICAR) 20 MG  tablet  04/27/18 08/17/19  [provider]    Family History No family history on file.  Social History Social History   Tobacco Use  . Smoking status: Former Smoker    Packs/day: 0.50    Years: 25.00    Pack years: 12.50    Quit date: 08/07/1985    Years since quitting: 34.0  . Smokeless tobacco: Never Used  Substance Use Topics  . Alcohol use: No    Alcohol/week: 1.0 standard drinks    Types: 1 Glasses of wine per week  . Drug use: No     Allergies   Morphine and related, Amoxicillin, Eggs or egg-derived products, Penicillins, and Lamictal [lamotrigine]   Review of Systems Review of Systems  Constitutional: Negative for chills and fever.  HENT: Negative for facial swelling and sore throat.   Respiratory: Negative for shortness of breath.   Cardiovascular: Positive for palpitations. Negative for chest pain.  Gastrointestinal: Negative for abdominal pain, nausea and vomiting.  Genitourinary: Negative for dysuria.  Musculoskeletal: Negative for back pain.  Skin: Negative for rash and wound.  Neurological: Positive for light-headedness. Negative for headaches.  Psychiatric/Behavioral: The patient is not nervous/anxious.      Physical Exam Updated Vital Signs BP (!) 151/73   Pulse 77   Temp 97.8 F (36.6 C) (Oral)   Resp 16   Ht 5\' 5"  (1.651 m)   Wt 49.8 kg   SpO2 96%   BMI 18.27 kg/m   Physical Exam Vitals signs and nursing note reviewed.  Constitutional:      General: She is not in acute distress.    Appearance: She is well-developed. She is not diaphoretic.  HENT:     Head: Normocephalic and atraumatic.     Mouth/Throat:     Pharynx: No oropharyngeal exudate.  Eyes:     General: No scleral icterus.       Right eye: No discharge.        Left eye: No discharge.     Conjunctiva/sclera: Conjunctivae normal.     Pupils: Pupils are equal, round, and reactive to light.  Neck:     Musculoskeletal: Normal range of motion and neck supple.      Thyroid: No thyromegaly.  Cardiovascular:     Rate and Rhythm: Regular rhythm. Tachycardia present.     Heart sounds: Normal heart sounds. No murmur. No friction rub. No gallop.      Comments: Mild tachycardia Pulmonary:     Effort: Pulmonary effort is normal. No respiratory distress.     Breath sounds: Normal  breath sounds. No stridor. No wheezing or rales.  Abdominal:     General: Bowel sounds are normal. There is no distension.     Palpations: Abdomen is soft.     Tenderness: There is no abdominal tenderness. There is no guarding or rebound.  Lymphadenopathy:     Cervical: No cervical adenopathy.  Skin:    General: Skin is warm and dry.     Coloration: Skin is not pale.     Findings: No rash.  Neurological:     Mental Status: She is alert.     Coordination: Coordination normal.      ED Treatments / Results  Labs (all labs ordered are listed, but only abnormal results are displayed) Labs Reviewed  BASIC METABOLIC PANEL - Abnormal; Notable for the following components:      Result Value   Glucose, Bld 120 (*)    BUN 32 (*)    Creatinine, Ser 1.79 (*)    GFR calc non Af Amer 26 (*)    GFR calc Af Amer 30 (*)    All other components within normal limits  CBC WITH DIFFERENTIAL/PLATELET - Abnormal; Notable for the following components:   Hemoglobin 11.6 (*)    All other components within normal limits  MAGNESIUM    EKG EKG Interpretation  Date/Time:  Thursday September 02 2019 03:47:24 EDT Ventricular Rate:  91 PR Interval:    QRS Duration: 83 QT Interval:  399 QTC Calculation: 491 R Axis:   85 Text Interpretation:  Sinus rhythm Multiple ventricular premature complexes Borderline prolonged PR interval Borderline right axis deviation Probable LVH with secondary repol abnrm Abnormal T, probable ischemia, lateral leads worse than prior Confirmed by Ripley Fraise ([id number]) on 09/02/2019 3:50:28 AM   Radiology Dg Chest Portable 1 View  Result Date: 09/02/2019  CLINICAL DATA:  Cough. EXAM: PORTABLE CHEST 1 VIEW COMPARISON:  04/09/2010 FINDINGS: Prominent but normal range heart size for technique. Negative aortic contours. Interstitial coarsening compared to prior with a few Kerley lines suggested on the right. No pleural effusion or air bronchogram. Left lumpectomy changes. IMPRESSION: Mild interstitial coarsening compared to prior which could be congestive or bronchitic. Electronically Signed   By: Monte Fantasia M.D.   On: 09/02/2019 04:21    Procedures Procedures (including critical care time)  Medications Ordered in ED Medications - No data to display   Initial Impression / Assessment and Plan / ED Course  I have reviewed the triage vital signs and the nursing notes.  Pertinent labs & imaging results that were available during my care of the patient were reviewed by me and considered in my medical decision making (see chart for details).  Clinical Course as of Sep 01 606  Thu Sep 02, 2019  0429 Discussed patient case with cardiologist on-call, Dr. Emilio Aspen who will help arrange outpatient follow-up for the patient.  He also recommends starting 3.125 mg Coreg twice daily.   [AL]    Clinical Course User Index [AL] Frederica Kuster, PA-C       Patient presenting with ongoing palpitations.  Labs are stable for the patient.  Patient has been seen for this at urgent care and by her PCP.  She has been referred to cardiology, however has not seen them yet.  EKG shows some T wave inversions, however Dr. Emilio Aspen did not feel these were significant outside of active chest pain or shortness of breath.  He recommended beginning Coreg as above.  Patient ambulated in the department and had  no symptoms.  She is feeling much better.  Patient advised to follow-up with cardiology and advised that they should be reaching out to her.  Return precautions discussed.  Patient understands and agrees with plan.  Patient vital stable and discharged in satisfactory  condition.  Patient also evaluated by my attending, Dr. Christy Gentles, who guided the patient's management and agrees with plan.  Final Clinical Impressions(s) / ED Diagnoses   Final diagnoses:  Palpitations    ED Discharge Orders         Ordered    carvedilol (COREG) 3.125 MG tablet  2 times daily with meals     09/02/19 0605           Frederica Kuster, PA-C 09/02/19 1975    Ripley Fraise, MD 09/02/19 [phone]

## 2019-09-02 NOTE — Progress Notes (Signed)
Cardiology [place] Note  Patient recently found to have reduced LV function (EF 30%) with regional hypokinesis on outpatient echocardiogram. No prior echo for comparison in our system. Has not yet established care with cardiologist. I will contact our daytime cardiologist on call to facilitate short interval outpatient follow up. Recommended initiation of carvedilol for new HFrEF with palpitations in the interim.   Marcie Mowers, MD Cardiology Fellow, PGY-7

## 2019-09-02 NOTE — Discharge Instructions (Signed)
Begin taking Coreg twice daily as prescribed.  Please follow-up with cardiology as soon as they are available to see you.  They should be reaching out to you, but if you have not heard from them by midday on Friday, please call to try and schedule an appointment.  Please return to the emergency department if you develop any new or worsening symptoms including significant worsening of your palpitations, passing out, severe chest pain or shortness of breath.

## 2019-09-02 NOTE — ED Notes (Signed)
Pt walked in the hallway with a pulse ox  sats ok  98-100 on room air.  Pulse 88-91  Tolerated well

## 2019-09-02 NOTE — ED Provider Notes (Signed)
Patient seen/examined in the Emergency Department in conjunction with [facility] Provider [facility] Patient reports palpitations.  Denies CP or syncope Exam : awake/alert, no distress, she appears anxious.  No murmurs noted on my exam Plan: workup pending Pt will need close cardiology followup and likely outpatient monitoring     Ripley Fraise, MD 09/02/19 [phone]

## 2019-09-02 NOTE — Telephone Encounter (Signed)
Pt called regarding Rx.  Pt states she called pharmacy and they had not received Rx.  [facility] reviewed chart, and during call, pharmacy called to notify pt that they have the Rx.    Rosslyn Pasion J. Clydene Laming, [place], [place], [place]

## 2019-09-02 NOTE — ED Triage Notes (Signed)
The pt arrived by [facility] from home  She woke up with an irregular pulse and some dizziness   She has had these same symptoms for 2 weeks  She saw her doctor for the same this past Friday  Alert oriented skin warm and dry on arrival

## 2019-09-03 ENCOUNTER — Other Ambulatory Visit: Payer: Self-pay

## 2019-09-03 ENCOUNTER — Inpatient Hospital Stay (HOSPITAL_COMMUNITY): Payer: Medicare Other

## 2019-09-03 ENCOUNTER — Inpatient Hospital Stay (HOSPITAL_COMMUNITY)
Admission: EM | Admit: 2019-09-03 | Discharge: 2019-09-09 | DRG: 286 | Disposition: A | Discharge status: Home / Self Care | Payer: Medicare Other | Attending: Cardiovascular Disease | Admitting: Cardiovascular Disease

## 2019-09-03 ENCOUNTER — Ambulatory Visit (INDEPENDENT_AMBULATORY_CARE_PROVIDER_SITE_OTHER): Payer: Medicare Other | Admitting: Cardiovascular Disease

## 2019-09-03 DIAGNOSIS — I200 Unstable angina: Secondary | ICD-10-CM | POA: Diagnosis present

## 2019-09-03 DIAGNOSIS — R002 Palpitations: Secondary | ICD-10-CM

## 2019-09-03 DIAGNOSIS — R072 Precordial pain: Secondary | ICD-10-CM

## 2019-09-03 DIAGNOSIS — E7801 Familial hypercholesterolemia: Secondary | ICD-10-CM | POA: Diagnosis not present

## 2019-09-03 DIAGNOSIS — H3530 Unspecified macular degeneration: Secondary | ICD-10-CM | POA: Diagnosis present

## 2019-09-03 DIAGNOSIS — Z79899 Other long term (current) drug therapy: Secondary | ICD-10-CM

## 2019-09-03 DIAGNOSIS — I5181 Takotsubo syndrome: Secondary | ICD-10-CM | POA: Diagnosis present

## 2019-09-03 DIAGNOSIS — Z888 Allergy status to other drugs, medicaments and biological substances status: Secondary | ICD-10-CM

## 2019-09-03 DIAGNOSIS — Z79890 Hormone replacement therapy (postmenopausal): Secondary | ICD-10-CM

## 2019-09-03 DIAGNOSIS — Z7982 Long term (current) use of aspirin: Secondary | ICD-10-CM

## 2019-09-03 DIAGNOSIS — M419 Scoliosis, unspecified: Secondary | ICD-10-CM | POA: Diagnosis present

## 2019-09-03 DIAGNOSIS — J301 Allergic rhinitis due to pollen: Secondary | ICD-10-CM | POA: Diagnosis present

## 2019-09-03 DIAGNOSIS — J449 Chronic obstructive pulmonary disease, unspecified: Secondary | ICD-10-CM | POA: Diagnosis present

## 2019-09-03 DIAGNOSIS — I2109 ST elevation (STEMI) myocardial infarction involving other coronary artery of anterior wall: Secondary | ICD-10-CM

## 2019-09-03 DIAGNOSIS — N183 Chronic kidney disease, stage 3 (moderate): Secondary | ICD-10-CM | POA: Diagnosis not present

## 2019-09-03 DIAGNOSIS — I341 Nonrheumatic mitral (valve) prolapse: Secondary | ICD-10-CM | POA: Diagnosis present

## 2019-09-03 DIAGNOSIS — Z20828 Contact with and (suspected) exposure to other viral communicable diseases: Secondary | ICD-10-CM | POA: Diagnosis present

## 2019-09-03 DIAGNOSIS — M810 Age-related osteoporosis without current pathological fracture: Secondary | ICD-10-CM | POA: Diagnosis present

## 2019-09-03 DIAGNOSIS — I5043 Acute on chronic combined systolic (congestive) and diastolic (congestive) heart failure: Secondary | ICD-10-CM | POA: Diagnosis present

## 2019-09-03 DIAGNOSIS — I272 Pulmonary hypertension, unspecified: Secondary | ICD-10-CM | POA: Diagnosis present

## 2019-09-03 DIAGNOSIS — Z853 Personal history of malignant neoplasm of breast: Secondary | ICD-10-CM

## 2019-09-03 DIAGNOSIS — N179 Acute kidney failure, unspecified: Secondary | ICD-10-CM | POA: Diagnosis present

## 2019-09-03 DIAGNOSIS — I214 Non-ST elevation (NSTEMI) myocardial infarction: Secondary | ICD-10-CM | POA: Diagnosis not present

## 2019-09-03 DIAGNOSIS — I2510 Atherosclerotic heart disease of native coronary artery without angina pectoris: Secondary | ICD-10-CM | POA: Diagnosis not present

## 2019-09-03 DIAGNOSIS — I5021 Acute systolic (congestive) heart failure: Secondary | ICD-10-CM | POA: Diagnosis not present

## 2019-09-03 DIAGNOSIS — N184 Chronic kidney disease, stage 4 (severe): Secondary | ICD-10-CM | POA: Diagnosis present

## 2019-09-03 DIAGNOSIS — I248 Other forms of acute ischemic heart disease: Secondary | ICD-10-CM | POA: Diagnosis present

## 2019-09-03 DIAGNOSIS — I5041 Acute combined systolic (congestive) and diastolic (congestive) heart failure: Secondary | ICD-10-CM | POA: Diagnosis not present

## 2019-09-03 DIAGNOSIS — Z880 Allergy status to penicillin: Secondary | ICD-10-CM | POA: Diagnosis not present

## 2019-09-03 DIAGNOSIS — I130 Hypertensive heart and chronic kidney disease with heart failure and stage 1 through stage 4 chronic kidney disease, or unspecified chronic kidney disease: Secondary | ICD-10-CM | POA: Diagnosis present

## 2019-09-03 DIAGNOSIS — M19021 Primary osteoarthritis, right elbow: Secondary | ICD-10-CM | POA: Diagnosis present

## 2019-09-03 DIAGNOSIS — I10 Essential (primary) hypertension: Secondary | ICD-10-CM | POA: Diagnosis not present

## 2019-09-03 DIAGNOSIS — Z885 Allergy status to narcotic agent status: Secondary | ICD-10-CM

## 2019-09-03 DIAGNOSIS — I25110 Atherosclerotic heart disease of native coronary artery with unstable angina pectoris: Secondary | ICD-10-CM | POA: Diagnosis present

## 2019-09-03 DIAGNOSIS — E039 Hypothyroidism, unspecified: Secondary | ICD-10-CM | POA: Diagnosis present

## 2019-09-03 DIAGNOSIS — I2542 Coronary artery dissection: Secondary | ICD-10-CM | POA: Diagnosis not present

## 2019-09-03 DIAGNOSIS — F419 Anxiety disorder, unspecified: Secondary | ICD-10-CM | POA: Diagnosis present

## 2019-09-03 DIAGNOSIS — I5023 Acute on chronic systolic (congestive) heart failure: Secondary | ICD-10-CM | POA: Diagnosis not present

## 2019-09-03 LAB — BASIC METABOLIC PANEL
Anion gap: 9 (ref 5–15)
BUN: 31 mg/dL — ABNORMAL HIGH (ref 8–23)
CO2: 23 mmol/L (ref 22–32)
Calcium: 9.3 mg/dL (ref 8.9–10.3)
Chloride: 108 mmol/L (ref 98–111)
Creatinine, Ser: 1.79 mg/dL — ABNORMAL HIGH (ref 0.44–1.00)
GFR calc Af Amer: 30 mL/min — ABNORMAL LOW (ref >60)
GFR calc non Af Amer: 26 mL/min — ABNORMAL LOW (ref >60)
Glucose, Bld: 103 mg/dL — ABNORMAL HIGH (ref 70–99)
Potassium: 3.9 mmol/L (ref 3.5–5.1)
Sodium: 140 mmol/L (ref 135–145)

## 2019-09-03 LAB — CBC
HCT: 38.8 % (ref 36.0–46.0)
Hemoglobin: 12 g/dL (ref 12.0–15.0)
MCH: 28.5 pg (ref 26.0–34.0)
MCHC: 30.9 g/dL (ref 30.0–36.0)
MCV: 92.2 fL (ref 80.0–100.0)
Platelets: 229 10*3/uL (ref 150–400)
RBC: 4.21 MIL/uL (ref 3.87–5.11)
RDW: 12.3 % (ref 11.5–15.5)
WBC: 7.1 10*3/uL (ref 4.0–10.5)
nRBC: 0 % (ref 0.0–0.2)

## 2019-09-03 LAB — TROPONIN I (HIGH SENSITIVITY)
Troponin I (High Sensitivity): 32 ng/L — ABNORMAL HIGH (ref <18)
Troponin I (High Sensitivity): 36 ng/L — ABNORMAL HIGH (ref <18)

## 2019-09-03 MED ORDER — HEPARIN BOLUS VIA INFUSION
3000.0000 [IU] | Freq: Once | INTRAVENOUS | Status: AC
  Administered 2019-09-03: 3000 [IU] via INTRAVENOUS
  Filled 2019-09-03: qty 3000

## 2019-09-03 MED ORDER — HEPARIN (PORCINE) 25000 UT/250ML-% IV SOLN
800.0000 [IU]/h | INTRAVENOUS | Status: DC
  Administered 2019-09-03: 23:00:00 600 [IU]/h via INTRAVENOUS
  Administered 2019-09-05 – 2019-09-06 (×2): 800 [IU]/h via INTRAVENOUS
  Filled 2019-09-03 (×3): qty 250

## 2019-09-03 NOTE — ED Triage Notes (Signed)
Pt here from [facility] seen here and urgent care for episodes of palpitations and irregular heart rate, had recent echo and was sent to cardiologist, who sent her here today for cath Monday but needs preliminary testing first. Denies chest pain on arrival.

## 2019-09-03 NOTE — ED Provider Notes (Addendum)
Emergency Department Provider Note   I have reviewed the triage vital signs and the nursing notes.   HISTORY  Chief Complaint Chest Pain   HPI Shelly [name] is a 78 y.o. female with past medical history reviewed below presents to the emergency department from her cardiology office for admission through the emergency department.  She is been having intermittent heart palpitations with chest discomfort while ambulating.  She has known history of CHF.  She was evaluated in the emergency department last night.  No active chest pain, fever, cough.  Denies palpitations currently.   Past Medical History:  Diagnosis Date  . Anxiety   . Arthritis    right elbow  . 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 Breast cancer ([place]) 03/2010   left breast s/p lumpectomy; adjuvant radiation; on adjuvant homrnal therapy  . Chronic kidney disease    stage 3  . COPD (chronic obstructive pulmonary disease) ([place])   . Hay fever   . History of breast cancer 09/29/2012  . Hypertension   . Hypothyroidism   . Macular degeneration   . Migraine   . MVP (mitral valve prolapse)   . Osteopenia   . PONV (postoperative nausea and vomiting)   . Right arm fracture   . Sciatic pain   . Scoliosis     Patient Active Problem List   Diagnosis Date Noted  . Acute MI, anterior wall ([place]) 09/03/2019  . Acute on chronic combined systolic and diastolic CHF (congestive heart failure) ([place]) 09/03/2019  . Unstable angina ([place]) 09/03/2019  . Osteoporosis 07/03/2015  . History of breast cancer 09/29/2012    Past Surgical History:  Procedure Laterality Date  . APPENDECTOMY    . BREAST BIOPSY    . BREAST LUMPECTOMY    . CATARACT EXTRACTION Right   . COLONOSCOPY    . DILATATION & CURETTAGE/HYSTEROSCOPY WITH MYOSURE N/A 05/24/2016   Procedure: DILATATION & CURETTAGE/HYSTEROSCOPY WITH MYOSURE;  Surgeon: Azucena Fallen, MD;  Location: [facility];  Service: Gynecology;  Laterality: N/A;  400 cc deficit  . DILATION AND  CURETTAGE OF UTERUS     uterine polyps  . EYE SURGERY Right    retina  . HERNIA REPAIR    . LAPAROTOMY    . right arm surgery     due to fracture  . TONSILLECTOMY    . TUBAL LIGATION    . UNILATERAL SALPINGECTOMY Left   . UPPER GI ENDOSCOPY    . WISDOM TOOTH EXTRACTION      Allergies Morphine and related, Amoxicillin, Eggs or egg-derived products, Penicillins, and Lamictal [lamotrigine]  No family history on file.  Social History Social History   Tobacco Use  . Smoking status: Former Smoker    Packs/day: 0.50    Years: 25.00    Pack years: 12.50    Quit date: 08/07/1985    Years since quitting: 34.0  . Smokeless tobacco: Never Used  Substance Use Topics  . Alcohol use: No    Alcohol/week: 1.0 standard drinks    Types: 1 Glasses of wine per week  . Drug use: No    Review of Systems  Constitutional: No fever/chills Eyes: No visual changes. ENT: No sore throat. Cardiovascular: Positive chest pain and intermittent palpitations.  Respiratory: Denies shortness of breath. Gastrointestinal: No abdominal pain.  No nausea, no vomiting.  No diarrhea.  No constipation. Genitourinary: Negative for dysuria. Musculoskeletal: Negative for back pain. Skin: Negative for rash. Neurological: Negative for headaches, focal weakness  or numbness.  10-point ROS otherwise negative.  ____________________________________________   PHYSICAL EXAM:  VITAL SIGNS: ED Triage Vitals  Enc Vitals Group     BP 09/03/19 1637 (!) 176/90     Pulse Rate 09/03/19 1637 80     Resp 09/03/19 1637 16     Temp 09/03/19 1637 98.5 F (36.9 C)     Temp Source 09/03/19 1637 Oral     SpO2 09/03/19 1637 97 %   Constitutional: Alert and oriented. Well appearing and in no acute distress. Eyes: Conjunctivae are normal.  Head: Atraumatic. Nose: No congestion/rhinnorhea. Mouth/Throat: Mucous membranes are moist.  Neck: No stridor. Cardiovascular: Normal rate, regular rhythm. Good peripheral  circulation. Grossly normal heart sounds.   Respiratory: Normal respiratory effort.  No retractions. Lungs CTAB. Gastrointestinal: No distention.  Musculoskeletal:  No gross deformities of extremities. Neurologic:  Normal speech and language. Skin:  Skin is warm, dry and intact. No rash noted.  ____________________________________________   LABS (all labs ordered are listed, but only abnormal results are displayed)  Labs Reviewed  BASIC METABOLIC PANEL - Abnormal; Notable for the following components:      Result Value   Glucose, Bld 103 (*)    BUN 31 (*)    Creatinine, Ser 1.79 (*)    GFR calc non Af Amer 26 (*)    GFR calc Af Amer 30 (*)    All other components within normal limits  TROPONIN I (HIGH SENSITIVITY) - Abnormal; Notable for the following components:   Troponin I (High Sensitivity) 32 (*)    All other components within normal limits  SARS CORONAVIRUS 2 (TAT 6-24 HRS)  CBC  CBC  HEPARIN LEVEL (UNFRACTIONATED)  TROPONIN I (HIGH SENSITIVITY)   ____________________________________________  EKG  EKG reviewed and similar to yesterday's tracing. No STEMI. Diffuse T wave inversions.  ____________________________________________  RADIOLOGY  Dg Chest 2 View  Result Date: 09/03/2019 CLINICAL DATA:  Acute chest pain. EXAM: CHEST - 2 VIEW COMPARISON:  09/02/2019 and prior radiographs FINDINGS: UPPER limits normal heart size again noted. There is no evidence of focal airspace disease, pulmonary edema, suspicious pulmonary nodule/mass, pleural effusion, or pneumothorax. LEFT breast surgical changes again noted. No acute bony abnormalities are identified. IMPRESSION: No active cardiopulmonary disease. Electronically Signed   By: Margarette Canada M.D.   On: 09/03/2019 18:24    ____________________________________________   PROCEDURES  Procedure(s) performed:   Procedures  CRITICAL CARE Performed by: Margette Fast Total critical care time: 35 minutes Critical care time  was exclusive of separately billable procedures and treating other patients. Critical care was necessary to treat or prevent imminent or life-threatening deterioration. Critical care was time spent personally by me on the following activities: development of treatment plan with patient and/or surrogate as well as nursing, discussions with consultants, evaluation of patient's response to treatment, examination of patient, obtaining history from patient or surrogate, ordering and performing treatments and interventions, ordering and review of laboratory studies, ordering and review of radiographic studies, pulse oximetry and re-evaluation of patient's condition.  Nanda Quinton, MD Emergency Medicine  ____________________________________________   INITIAL IMPRESSION / ASSESSMENT AND PLAN / ED COURSE  Pertinent labs & imaging results that were available during my care of the patient were reviewed by me and considered in my medical decision making (see chart for details).   Patient presents to the emergency department for admit through the ED by her cardiologist.  Patient is not having active chest pain.  She is well-appearing here. Labs reviewed  along with orders.   Care transferred to Cardiology service.  I reviewed all nursing notes, vitals, pertinent old records, EKGs, labs, imaging (as available).  ____________________________________________  FINAL CLINICAL IMPRESSION(S) / ED DIAGNOSES  Final diagnoses:  Precordial chest pain  Palpitations     MEDICATIONS GIVEN DURING THIS VISIT:  Medications  heparin bolus via infusion 3,000 Units (has no administration in time range)  heparin ADULT infusion 100 units/mL (25000 units/290mL sodium chloride 0.45%) (has no administration in time range)    Note:  This document was prepared using Dragon voice recognition software and may include unintentional dictation errors.  Nanda Quinton, MD Emergency Medicine    Khyli Swaim, [facility], MD 09/03/19  [id number]    Margette Fast, MD 09/10/19 1956

## 2019-09-03 NOTE — Patient Instructions (Signed)
Medication Instructions:  Your physician recommends that you continue on your current medications as directed. Please refer to the Current Medication list given to you today.  If you need a refill on your cardiac medications before your next appointment, please call your pharmacy.   Lab work: None Ordered  Testing/Procedures: None Ordered  Any Other Special Instructions Will Be Listed Below (If Applicable). Dr. Acie Fredrickson would like you to go directly to the Emergency Room.

## 2019-09-03 NOTE — H&P (Signed)
Cardiology   History and Physical     Date: 09/03/2019  Shelly [name], DOB September 03, 1941, MRN [id number]  PCP: Shon Baton, MD  Cardiologist: Nahser  Electrophysiologist: None  Referring MD: Shon Baton, MD     Chief Complaint  Patient presents with  . Congestive Heart Failure   History of Present Illness:   Shelly [name] is a 78 y.o. female with a hx of CHF.  She was seen in the [facility] ER yesterday am and was scheduled for [name] appt with the DOD the following day .  Echo on Sept. 4 revealed EF of 30-35%. Grade 1 diastolic dysfunction. Moderate pulmonary HTN with PA pressure of 56 mmHg.  Mild - mod MR  We were asked to see her by Dr. Virgina Jock for further evaluation of sudden onset of rapid HR .  [place] the last [place] in [place]  Woke up , could not breath  Felt weak. Called EMS. EMS did [name] ECG but HR had slowed by that time .  Came back to [name] to urgent care. .  ECG showed marked TWI , Felt fine, Did not get admitted.  Echo was ordered and showed EF of 30-35%. , moderate hypokinesis of anterior wall  PA pressure of 56 mmHg.  Was stared on Coreg 3. 125 BID  Has had some tightness in her chest for most of this summer.  Really has not felt well  Has been getting tired early .  Has tightness in her chest with walking .  Would typically resolve when she stopped to rest.  She is had several episodes at night where she has very fast and rapid heart rate irregularities. These are associated with intense chest pain and lightheadedness. Clinically these could be consistent with ventricular tachycardia.      Past Medical History:  Diagnosis Date  . Anxiety   . Arthritis    right elbow  . 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 Breast cancer ([place]) 03/2010   left breast s/p lumpectomy; adjuvant radiation; on adjuvant homrnal therapy  . Chronic kidney disease    stage 3  . COPD (chronic obstructive pulmonary disease) ([place])   . Hay fever   . History of breast  cancer 09/29/2012  . Hypertension   . Hypothyroidism   . Macular degeneration   . Migraine   . MVP (mitral valve prolapse)   . Osteopenia   . PONV (postoperative nausea and vomiting)   . Right arm fracture   . Sciatic pain   . Scoliosis         Past Surgical History:  Procedure Laterality Date  . APPENDECTOMY    . BREAST BIOPSY    . BREAST LUMPECTOMY    . CATARACT EXTRACTION Right   . COLONOSCOPY    . DILATATION & CURETTAGE/HYSTEROSCOPY WITH MYOSURE N/A 05/24/2016   Procedure: DILATATION & CURETTAGE/HYSTEROSCOPY WITH MYOSURE; Surgeon: Azucena Fallen, MD; Location: [facility]; Service: Gynecology; Laterality: N/A; 400 cc deficit  . DILATION AND CURETTAGE OF UTERUS     uterine polyps  . EYE SURGERY Right    retina  . HERNIA REPAIR    . LAPAROTOMY    . right arm surgery     due to fracture  . TONSILLECTOMY    . TUBAL LIGATION    . UNILATERAL SALPINGECTOMY Left   . UPPER GI ENDOSCOPY    . WISDOM TOOTH EXTRACTION    Current Medications:  Active Medications  Allergies: Morphine and related, Amoxicillin, Eggs or egg-derived products, Penicillins, and Lamictal [lamotrigine]  Social History        Socioeconomic History  . Marital status: Married    Spouse name: Not on file  . Number of children: Not on file  . Years of education: Not on file  . Highest education level: Not on file  Occupational History  . Not on file  Social Needs  . Financial resource strain: Not on file  . Food insecurity    Worry: Not on file    Inability: Not on file  . Transportation needs    Medical: Not on file    Non-medical: Not on file  Tobacco Use  . Smoking status: Former Smoker    Packs/day: 0.50    Years: 25.00    Pack years: 12.50    Quit date: 08/07/1985    Years since quitting: 34.0  . Smokeless tobacco: Never Used  Substance and Sexual Activity  . Alcohol use: No    Alcohol/week: 1.0 standard drinks    Types:  1 Glasses of wine per week  . Drug use: No  . Sexual activity: Yes    Birth control/protection: Post-menopausal  Lifestyle  . Physical activity    Days per week: Not on file    Minutes per session: Not on file  . Stress: Not on file  Relationships  . Social Herbalist on phone: Not on file    Gets together: Not on file    Attends religious service: Not on file    Active member of club or organization: Not on file    Attends meetings of clubs or organizations: Not on file    Relationship status: Not on file  Other Topics Concern  . Not on file  Social History Narrative  . Not on file  Family History:  The patient's family history is not on file.  ROS:  Please see the history of present illness.  All other systems reviewed and are negative.  EKGs/Labs/Other Studies Reviewed:   The following studies were reviewed today:  EKG: ECG from 2017 is essentially normal.  ECG from August 17, 2019 shows evolving anterior wall myocardial infarction with T wave inversions in the inferior and lateral leads.  ECG from September 01, 2019 shows diffuse T wave inversions in the inferior, anterior and lateral leads consistent with evolving large anterior wall myocardial infarction.  Recent Labs:  08/17/2019: TSH 0.615  09/02/2019: BUN 32; Creatinine, Ser 1.79; Hemoglobin 11.6; Magnesium 2.2; Platelets 240; Potassium 3.7; Sodium 140  Recent Lipid Panel  Labs (Brief)     Physical Exam:   VS: BP (!) 152/78  Pulse 91  Ht 5\' 5"  (1.651 m)  Wt 110 lb 6.4 oz (50.1 kg)  SpO2 97%  BMI 18.37 kg/m     Wt Readings from Last 3 Encounters:  09/03/19 110 lb 6.4 oz (50.1 kg)  09/02/19 109 lb 12.6 oz (49.8 kg)  10/01/18 109 lb 11.2 oz (49.8 kg)  GEN: Well nourished, well developed in no acute distress  HEENT: Normal  NECK: No JVD; No carotid bruits  LYMPHATICS: No lymphadenopathy  CARDIAC: RRR, , 3/6 systolic ejection murmur radiating to the axilla. She has a bounding PMI.  RESPIRATORY: Clear  to auscultation without rales, wheezing or rhonchi  ABDOMEN: Soft, non-tender, non-distended  MUSCULOSKELETAL: 1+ leg edema.  SKIN: Warm and dry  NEUROLOGIC: Alert and oriented x 3  PSYCHIATRIC: Normal affect  ASSESSMENT:    1.  Acute MI, anterior wall ([place])   2. Acute on chronic combined systolic and diastolic CHF (congestive heart failure) ([place])    PLAN:   In order of problems listed above:  1. Coronary artery disease: The patient presents with symptoms and signs consistent with recent anterior wall myocardial infarction. She has had episodes of angina all summer long. She had [name] acute episode/arrhythmia in late August while [place] the mountains. She returned to [facility] and was seen in our emergency room on August 25. At that time she had [name] EKG consistent with [name] evolving anterior wall myocardial infarction. Troponin levels were not drawn. She was feeling better at that time so she was discharged to home. Echocardiogram was ordered and was performed on September 4. She was found to have anteroapical severe hypokinesis with [name] ejection fraction of 30%. She had moderate to severe LV dilatation. She has at least mild to moderate mitral regurgitation. She had recurrent symptoms of arrhythmia and severe chest pain and shortness of breath on September 10 and return to the emergency room. She was feeling better after some time and was once again emergency room. Troponin levels were not drawn at that time.  She is now seen in the office with the history as noted. I suspect that she is had a recent demise approximately 3 weeks ago and now has congestive heart failure and likely ventricular tachycardia following that myocardial infarction. She has ankle edema that seems to have worsened over the past several weeks.  She needs to be admitted to the hospital. We will gently diurese her. We will need to be careful because she has some underlying chronic kidney disease. She needs to be started on IV heparin,  IV nitroglycerin. She needs additional beta-blocker.  She needs to be scheduled for heart catheterization-likely on Monday. We will need to watch her creatinine very closely. I discussed the risk, benefits, options of heart catheterization with her. She understands and agrees to proceed.  2. Hypertension: She is been on amlodipine. The amlodipine has caused some mild leg edema but this leg edema has worsened over the past several weeks.   Cardiology Office Note:   Date: 09/03/2019  ID: Harle Battiest, DOB 01/08/1941, MRN [id number]  PCP: Shon Baton, MD  Cardiologist: Nahser  Electrophysiologist: None  Referring MD: Shon Baton, MD     Chief Complaint  Patient presents with  . Congestive Heart Failure   History of Present Illness:   Shelly [name] is a 78 y.o. female with a hx of CHF.  She was seen in the [facility] ER yesterday am and was scheduled for [name] appt with the DOD the following day .  Echo on Sept. 4 revealed EF of 30-35%. Grade 1 diastolic dysfunction. Moderate pulmonary HTN with PA pressure of 56 mmHg.  Mild - mod MR  We were asked to see her by Dr. Virgina Jock for further evaluation of sudden onset of rapid HR .  [place] the [place] the last [place] in [employer]  Woke up , could not breath  Felt weak. Called EMS. EMS did [name] ECG but HR had slowed by that time .  Came back to [name] to urgent care. .  ECG showed marked TWI , Felt fine, Did not get admitted.  Echo was ordered and showed EF of 30-35%. , moderate hypokinesis of anterior wall  PA pressure of 56 mmHg.  Was stared on Coreg 3. 125 BID  Has had some tightness in her chest  for most of this summer.  Really has not felt well  Has been getting tired early .  Has tightness in her chest with walking .  Would typically resolve when she stopped to rest.  She is had several episodes at night where she has very fast and rapid heart rate irregularities. These are associated with intense chest pain and lightheadedness. Clinically  these could be consistent with ventricular tachycardia.      Past Medical History:  Diagnosis Date  . Anxiety   . Arthritis    right elbow  . 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 Breast cancer ([place]) 03/2010   left breast s/p lumpectomy; adjuvant radiation; on adjuvant homrnal therapy  . Chronic kidney disease    stage 3  . COPD (chronic obstructive pulmonary disease) ([place])   . Hay fever   . History of breast cancer 09/29/2012  . Hypertension   . Hypothyroidism   . Macular degeneration   . Migraine   . MVP (mitral valve prolapse)   . Osteopenia   . PONV (postoperative nausea and vomiting)   . Right arm fracture   . Sciatic pain   . Scoliosis         Past Surgical History:  Procedure Laterality Date  . APPENDECTOMY    . BREAST BIOPSY    . BREAST LUMPECTOMY    . CATARACT EXTRACTION Right   . COLONOSCOPY    . DILATATION & CURETTAGE/HYSTEROSCOPY WITH MYOSURE N/A 05/24/2016   Procedure: DILATATION & CURETTAGE/HYSTEROSCOPY WITH MYOSURE; Surgeon: Azucena Fallen, MD; Location: [facility]; Service: Gynecology; Laterality: N/A; 400 cc deficit  . DILATION AND CURETTAGE OF UTERUS     uterine polyps  . EYE SURGERY Right    retina  . HERNIA REPAIR    . LAPAROTOMY    . right arm surgery     due to fracture  . TONSILLECTOMY    . TUBAL LIGATION    . UNILATERAL SALPINGECTOMY Left   . UPPER GI ENDOSCOPY    . WISDOM TOOTH EXTRACTION    Current Medications:  Active Medications                                                                Allergies: Morphine and related, Amoxicillin, Eggs or egg-derived products, Penicillins, and Lamictal [lamotrigine]  Social History        Socioeconomic History  . Marital status: Married    Spouse name: Not on file  . Number of children: Not on file  . Years of education: Not on file  . Highest education level: Not on file  Occupational History  . Not on file  Social Needs  . Financial resource strain: Not on file  . Food  insecurity    Worry: Not on file    Inability: Not on file  . Transportation needs    Medical: Not on file    Non-medical: Not on file  Tobacco Use  . Smoking status: Former Smoker    Packs/day: 0.50    Years: 25.00    Pack years: 12.50    Quit date: 08/07/1985    Years since quitting: 34.0  . Smokeless tobacco: Never Used  Substance and Sexual Activity  . Alcohol use: No    Alcohol/week: 1.0  standard drinks    Types: 1 Glasses of wine per week  . Drug use: No  . Sexual activity: Yes    Birth control/protection: Post-menopausal  Lifestyle  . Physical activity    Days per week: Not on file    Minutes per session: Not on file  . Stress: Not on file  Relationships  . Social Herbalist on phone: Not on file    Gets together: Not on file    Attends religious service: Not on file    Active member of club or organization: Not on file    Attends meetings of clubs or organizations: Not on file    Relationship status: Not on file  Other Topics Concern  . Not on file  Social History Narrative  . Not on file  Family History:  The patient's family history is not on file.  ROS:  Please see the history of present illness.  All other systems reviewed and are negative.  EKGs/Labs/Other Studies Reviewed:   The following studies were reviewed today:  EKG: ECG from 2017 is essentially normal.  ECG from August 17, 2019 shows evolving anterior wall myocardial infarction with T wave inversions in the inferior and lateral leads.  ECG from September 01, 2019 shows diffuse T wave inversions in the inferior, anterior and lateral leads consistent with evolving large anterior wall myocardial infarction.  Recent Labs:  08/17/2019: TSH 0.615  09/02/2019: BUN 32; Creatinine, Ser 1.79; Hemoglobin 11.6; Magnesium 2.2; Platelets 240; Potassium 3.7; Sodium 140  Recent Lipid Panel  Labs (Brief)     Physical Exam:   VS: BP (!) 152/78  Pulse 91  Ht 5\' 5"  (1.651 m)  Wt 110 lb 6.4 oz (50.1  kg)  SpO2 97%  BMI 18.37 kg/m     Wt Readings from Last 3 Encounters:  09/03/19 110 lb 6.4 oz (50.1 kg)  09/02/19 109 lb 12.6 oz (49.8 kg)  10/01/18 109 lb 11.2 oz (49.8 kg)  GEN: Well nourished, well developed in no acute distress  HEENT: Normal  NECK: No JVD; No carotid bruits  LYMPHATICS: No lymphadenopathy  CARDIAC: RRR, , 3/6 systolic ejection murmur radiating to the axilla. She has a bounding PMI.  RESPIRATORY: Clear to auscultation without rales, wheezing or rhonchi  ABDOMEN: Soft, non-tender, non-distended  MUSCULOSKELETAL: 1+ leg edema.  SKIN: Warm and dry  NEUROLOGIC: Alert and oriented x 3  PSYCHIATRIC: Normal affect  ASSESSMENT:    1. Acute MI, anterior wall ([place])   2. Acute on chronic combined systolic and diastolic CHF (congestive heart failure) ([place])    PLAN:   In order of problems listed above:  2. Coronary artery disease: The patient presents with symptoms and signs consistent with recent anterior wall myocardial infarction. She has had episodes of angina all summer long. She had [name] acute episode/arrhythmia in late [place] mountains. She returned to [facility] and was seen in our emergency room on August 25. At that time she had [name] EKG consistent with [name] evolving anterior wall myocardial infarction. Troponin levels were not drawn. She was feeling better at that time so she was discharged to home. Echocardiogram was ordered and was performed on September 4. She was found to have anteroapical severe hypokinesis with [name] ejection fraction of 30%. She had moderate to severe LV dilatation. She has at least mild to moderate mitral regurgitation. She had recurrent symptoms of arrhythmia and severe chest pain and shortness of breath on September 10 and  return to the emergency room. She was feeling better after some time and was once again emergency room. Troponin levels were not drawn at that time.  She is now seen in the office with the history as noted. I suspect  that she is had a recent demise approximately 3 weeks ago and now has congestive heart failure and likely ventricular tachycardia following that myocardial infarction. She has ankle edema that seems to have worsened over the past several weeks.  She needs to be admitted to the hospital. We will gently diurese her. We will need to be careful because she has some underlying chronic kidney disease. She needs to be started on IV heparin, IV nitroglycerin. She needs additional beta-blocker.  She needs to be scheduled for heart catheterization-likely on Monday. We will need to watch her creatinine very closely. I discussed the risk, benefits, options of heart catheterization with her. She understands and agrees to proceed.  2. Hypertension: She is been on amlodipine. The amlodipine has caused some mild leg edema but this leg edema has worsened over the past several weeks.

## 2019-09-03 NOTE — Progress Notes (Signed)
ANTICOAGULATION CONSULT NOTE - Initial Consult  Pharmacy Consult for Heparin Indication: chest pain/ACS  Allergies  Allergen Reactions  . Morphine And Related Nausea And Vomiting  . Amoxicillin     Has patient had a PCN reaction causing immediate rash, facial/tongue/throat swelling, SOB or lightheadedness with hypotension: Yes Has patient had a PCN reaction causing severe rash involving mucus membranes or skin necrosis: Yes Has patient had a PCN reaction that required hospitalization Yes Has patient had a PCN reaction occurring within the last 10 years: No If all of the above answers are "NO", then may proceed with Cephalosporin use.    . Eggs Or Egg-Derived Products Swelling  . Penicillins Other (See Comments)    Febrile, diarrhea,fever Has patient had a PCN reaction causing immediate rash, facial/tongue/throat swelling, SOB or lightheadedness with hypotension: Yes Has patient had a PCN reaction causing severe rash involving mucus membranes or skin necrosis: Yes Has patient had a PCN reaction that required hospitalization Yes Has patient had a PCN reaction occurring within the last 10 years: No If all of the above answers are "NO", then may proceed with Cephalosporin use.   . Lamictal [Lamotrigine] Rash    Patient Measurements:   Heparin Dosing Weight: 50.1 kg  Vital Signs: Temp: 98.5 F (36.9 C) (09/11 1637) Temp Source: Oral (09/11 1637) BP: 176/90 (09/11 1637) Pulse Rate: 80 (09/11 1637)  Labs: Recent Labs    09/02/19 0359 09/03/19 1637  HGB 11.6* 12.0  HCT 37.5 38.8  PLT 240 229  CREATININE 1.79*  --     Estimated Creatinine Clearance: 19.8 mL/min (A) (by C-G formula based on SCr of 1.79 mg/dL (H)).   Medical History: Past Medical History:  Diagnosis Date  . Anxiety   . Arthritis    right elbow  . 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 Breast cancer ([place]) 03/2010   left breast s/p lumpectomy; adjuvant radiation; on adjuvant homrnal therapy  .  Chronic kidney disease    stage 3  . COPD (chronic obstructive pulmonary disease) ([place])   . Hay fever   . History of breast cancer 09/29/2012  . Hypertension   . Hypothyroidism   . Macular degeneration   . Migraine   . MVP (mitral valve prolapse)   . Osteopenia   . PONV (postoperative nausea and vomiting)   . Right arm fracture   . Sciatic pain   . Scoliosis     Medications:  Scheduled:  . heparin  3,000 Units Intravenous Once   Infusions:  . heparin      Assessment: 78 y.o. female presenting with palpitations and irregular heart rate. Experiences intense chest pain and lightheadedness at night. PMH significant for CHF, HTN, COPD, CKD stage 3. No anticoagulation PTA. Hgb 12, Plts 229. Pharmacy consulted for heparin dosing.  Goal of Therapy:  Heparin level 0.3-0.7 units/ml Monitor platelets by anticoagulation protocol: Yes   Plan:  Heparin bolus 3000 units Heparin gtt 600 units Check 8hr level Daily HL and CBC Monitor s/sx of bleeding    Lorel Monaco, PharmD PGY1 Ambulatory Care Resident [employer] # [phone]

## 2019-09-03 NOTE — ED Notes (Signed)
Xray to bring pt to H011

## 2019-09-04 ENCOUNTER — Encounter (HOSPITAL_COMMUNITY): Payer: Self-pay | Admitting: *Deleted

## 2019-09-04 DIAGNOSIS — I200 Unstable angina: Secondary | ICD-10-CM

## 2019-09-04 DIAGNOSIS — I5041 Acute combined systolic (congestive) and diastolic (congestive) heart failure: Secondary | ICD-10-CM

## 2019-09-04 DIAGNOSIS — I10 Essential (primary) hypertension: Secondary | ICD-10-CM

## 2019-09-04 DIAGNOSIS — N183 Chronic kidney disease, stage 3 (moderate): Secondary | ICD-10-CM

## 2019-09-04 LAB — CBC
HCT: 36.3 % (ref 36.0–46.0)
Hemoglobin: 11.4 g/dL — ABNORMAL LOW (ref 12.0–15.0)
MCH: 29.1 pg (ref 26.0–34.0)
MCHC: 31.4 g/dL (ref 30.0–36.0)
MCV: 92.6 fL (ref 80.0–100.0)
Platelets: 217 10*3/uL (ref 150–400)
RBC: 3.92 MIL/uL (ref 3.87–5.11)
RDW: 12.4 % (ref 11.5–15.5)
WBC: 6 10*3/uL (ref 4.0–10.5)
nRBC: 0 % (ref 0.0–0.2)

## 2019-09-04 LAB — BASIC METABOLIC PANEL
Anion gap: 7 (ref 5–15)
BUN: 23 mg/dL (ref 8–23)
CO2: 27 mmol/L (ref 22–32)
Calcium: 9.1 mg/dL (ref 8.9–10.3)
Chloride: 109 mmol/L (ref 98–111)
Creatinine, Ser: 1.63 mg/dL — ABNORMAL HIGH (ref 0.44–1.00)
GFR calc Af Amer: 34 mL/min — ABNORMAL LOW (ref >60)
GFR calc non Af Amer: 29 mL/min — ABNORMAL LOW (ref >60)
Glucose, Bld: 118 mg/dL — ABNORMAL HIGH (ref 70–99)
Potassium: 3.6 mmol/L (ref 3.5–5.1)
Sodium: 143 mmol/L (ref 135–145)

## 2019-09-04 LAB — BRAIN NATRIURETIC PEPTIDE: B Natriuretic Peptide: 1032.6 pg/mL — ABNORMAL HIGH (ref 0.0–100.0)

## 2019-09-04 LAB — HEPARIN LEVEL (UNFRACTIONATED): Heparin Unfractionated: 0.2 IU/mL — ABNORMAL LOW (ref 0.30–0.70)

## 2019-09-04 LAB — SARS CORONAVIRUS 2 (TAT 6-24 HRS): SARS Coronavirus 2: NEGATIVE

## 2019-09-04 LAB — MRSA PCR SCREENING: MRSA by PCR: NEGATIVE

## 2019-09-04 MED ORDER — LEVOTHYROXINE SODIUM 50 MCG PO TABS: 50.0000 ug | ORAL_TABLET | Freq: Every day | ORAL | Status: DC

## 2019-09-04 MED ORDER — POTASSIUM CHLORIDE CRYS ER 20 MEQ PO TBCR
20.0000 meq | EXTENDED_RELEASE_TABLET | Freq: Once | ORAL | Status: AC
  Administered 2019-09-04: 20 meq via ORAL
  Filled 2019-09-04: qty 1

## 2019-09-04 MED ORDER — ACETAMINOPHEN 325 MG PO TABS
650.0000 mg | ORAL_TABLET | ORAL | Status: DC | PRN
  Administered 2019-09-08 (×3): 650 mg via ORAL
  Filled 2019-09-04 (×4): qty 2

## 2019-09-04 MED ORDER — CARVEDILOL 3.125 MG PO TABS
3.1250 mg | ORAL_TABLET | Freq: Two times a day (BID) | ORAL | Status: DC
  Administered 2019-09-04: 3.125 mg via ORAL
  Filled 2019-09-04: qty 1

## 2019-09-04 MED ORDER — LEVOTHYROXINE SODIUM 50 MCG PO TABS
50.0000 ug | ORAL_TABLET | ORAL | Status: DC
  Administered 2019-09-04 – 2019-09-05 (×2): 50 ug via ORAL
  Filled 2019-09-04 (×2): qty 1

## 2019-09-04 MED ORDER — NITROGLYCERIN IN D5W 200-5 MCG/ML-% IV SOLN
0.0000 ug/min | INTRAVENOUS | Status: DC
  Administered 2019-09-04: 04:00:00 3 ug/min via INTRAVENOUS
  Filled 2019-09-04: qty 250

## 2019-09-04 MED ORDER — ASPIRIN EC 81 MG PO TBEC
81.0000 mg | DELAYED_RELEASE_TABLET | Freq: Every day | ORAL | Status: DC
  Administered 2019-09-04 – 2019-09-09 (×5): 81 mg via ORAL
  Filled 2019-09-04 (×7): qty 1

## 2019-09-04 MED ORDER — CARVEDILOL 6.25 MG PO TABS
6.2500 mg | ORAL_TABLET | Freq: Two times a day (BID) | ORAL | Status: DC
  Administered 2019-09-04 – 2019-09-05 (×2): 6.25 mg via ORAL
  Filled 2019-09-04 (×2): qty 1

## 2019-09-04 MED ORDER — BUPROPION HCL ER (XL) 150 MG PO TB24
150.0000 mg | ORAL_TABLET | Freq: Every day | ORAL | Status: DC
  Administered 2019-09-04 – 2019-09-09 (×5): 150 mg via ORAL
  Filled 2019-09-04 (×6): qty 1

## 2019-09-04 MED ORDER — LEVOTHYROXINE SODIUM 75 MCG PO TABS
75.0000 ug | ORAL_TABLET | ORAL | Status: DC
  Administered 2019-09-06 – 2019-09-09 (×4): 75 ug via ORAL
  Filled 2019-09-04 (×4): qty 1

## 2019-09-04 MED ORDER — FUROSEMIDE 10 MG/ML IJ SOLN
40.0000 mg | Freq: Every day | INTRAMUSCULAR | Status: DC
  Administered 2019-09-04: 09:00:00 40 mg via INTRAVENOUS
  Filled 2019-09-04: qty 4

## 2019-09-04 MED ORDER — ASPIRIN 300 MG RE SUPP: 300.0000 mg | RECTAL | Status: AC

## 2019-09-04 MED ORDER — ASPIRIN 81 MG PO CHEW: 324.0000 mg | CHEWABLE_TABLET | ORAL | Status: AC

## 2019-09-04 MED ORDER — ONDANSETRON HCL 4 MG/2ML IJ SOLN: 4.0000 mg | Freq: Four times a day (QID) | INTRAMUSCULAR | Status: DC | PRN

## 2019-09-04 MED ORDER — ROSUVASTATIN CALCIUM 5 MG PO TABS
10.0000 mg | ORAL_TABLET | Freq: Every day | ORAL | Status: DC
  Administered 2019-09-04 – 2019-09-06 (×3): 10 mg via ORAL
  Filled 2019-09-04 (×4): qty 2

## 2019-09-04 NOTE — Plan of Care (Signed)
  Problem: Clinical Measurements: Goal: Ability to maintain clinical measurements within normal limits will improve Outcome: Progressing   Problem: Clinical Measurements: Goal: Diagnostic test results will improve Outcome: Progressing   Problem: Clinical Measurements: Goal: Cardiovascular complication will be avoided Outcome: Progressing   Problem: Coping: Goal: Level of anxiety will decrease Outcome: Progressing   Problem: Elimination: Goal: Will not experience complications related to bowel motility Outcome: Progressing   Problem: Safety: Goal: Ability to remain free from injury will improve Outcome: Progressing

## 2019-09-04 NOTE — Progress Notes (Signed)
ANTICOAGULATION CONSULT NOTE - Follow Up Consult  Pharmacy Consult for Heparin Indication: chest pain/ACS  Allergies  Allergen Reactions  . Morphine And Related Nausea And Vomiting  . Yellow Dyes (Non-Tartrazine) Itching, Rash and Other (See Comments)    SEVERE RASH THAT COVERED THE BACK (lasted 3 months)  . Amoxicillin Diarrhea and Other (See Comments)    Fever spiked at close to 105 F Has patient had a PCN reaction causing immediate rash, facial/tongue/throat swelling, SOB or lightheadedness with hypotension: Yes Has patient had a PCN reaction causing severe rash involving mucus membranes or skin necrosis: Yes Has patient had a PCN reaction that required hospitalization Yes Has patient had a PCN reaction occurring within the last 10 years: No If all of the above answers are "NO", then may proceed with Cephalosporin use.    . Eggs Or Egg-Derived Products Swelling and Other (See Comments)    Eyes, lips, and mouth became swollen- no shortness of breath, however  . Penicillins Diarrhea and Other (See Comments)    Febrile and diarrhea Has patient had a PCN reaction causing immediate rash, facial/tongue/throat swelling, SOB or lightheadedness with hypotension: Yes Has patient had a PCN reaction causing severe rash involving mucus membranes or skin necrosis: Yes Has patient had a PCN reaction that required hospitalization Yes Has patient had a PCN reaction occurring within the last 10 years: No If all of the above answers are "NO", then may proceed with Cephalosporin use.   . Pollen Extract Other (See Comments)    Itchy eyes, runny nose and congestion  . Lamictal [Lamotrigine] Rash    Patient Measurements: Height: 5\' 1"  (154.9 cm) Weight: 108 lb 0.4 oz (49 kg) IBW/kg (Calculated) : 47.8 Heparin Dosing Weight: 50.1 kg  Vital Signs: Temp: 97.9 F (36.6 C) (09/12 1115) Temp Source: Oral (09/12 1115) BP: 148/82 (09/12 1115) Pulse Rate: 74 (09/12 1115)  Labs: Recent Labs   09/02/19 0359 09/03/19 1637 09/03/19 1837 09/04/19 0740  HGB 11.6* 12.0  --  11.4*  HCT 37.5 38.8  --  36.3  PLT 240 229  --  217  HEPARINUNFRC  --   --   --  0.20*  CREATININE 1.79* 1.79*  --  1.63*  TROPONINIHS  --  32* 36*  --     Estimated Creatinine Clearance: 20.8 mL/min (A) (by C-G formula based on SCr of 1.63 mg/dL (H)).   Medical History: Past Medical History:  Diagnosis Date  . Anxiety   . Arthritis    right elbow  . 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 Breast cancer ([place]) 03/2010   left breast s/p lumpectomy; adjuvant radiation; on adjuvant homrnal therapy  . Chronic kidney disease    stage 3  . COPD (chronic obstructive pulmonary disease) ([place])   . Hay fever   . History of breast cancer 09/29/2012  . Hypertension   . Hypothyroidism   . Macular degeneration   . Migraine   . MVP (mitral valve prolapse)   . Osteopenia   . PONV (postoperative nausea and vomiting)   . Right arm fracture   . Sciatic pain   . Scoliosis     Medications:  Scheduled:  . aspirin  324 mg Oral NOW   Or  . aspirin  300 mg Rectal NOW  . aspirin EC  81 mg Oral Daily  . buPROPion  150 mg Oral Daily  . carvedilol  6.25 mg Oral BID WC  . furosemide  40 mg Intravenous Daily  .  levothyroxine  50 mcg Oral Once per day on Sun Sat   And  . [START ON 09/06/2019] levothyroxine  75 mcg Oral Once per day on Mon Tue Wed Thu Fri  . rosuvastatin  10 mg Oral Daily   Infusions:  . heparin 600 Units/hr (09/04/19 0636)  . nitroGLYCERIN 3 mcg/min (09/04/19 0636)    Assessment: 78 y.o. female presenting with palpitations and irregular heart rate. Experiences intense chest pain and lightheadedness at night. PMH significant for CHF, HTN, COPD, CKD stage 3. No anticoagulation PTA. Hgb 12, Plts 229. Pharmacy consulted for heparin dosing.  Heparin drip 600 uts/hr HL 0.2 < goal, cbc ok no bleeding  Plan cath Monday   Goal of Therapy:  Heparin level 0.3-0.7 units/ml Monitor platelets by  anticoagulation protocol: Yes   Plan:  Increase Heparin gtt 800 units/hr  Daily HL and CBC Monitor s/sx of bleeding  Bonnita Nasuti Pharm.D. CPP, BCPS Clinical Pharmacist [phone] 09/04/2019 1:55 PM

## 2019-09-04 NOTE — Plan of Care (Signed)
  Problem: Education: Goal: Knowledge of General Education information will improve Description Including pain rating scale, medication(s)/side effects and non-pharmacologic comfort measures Outcome: Progressing   Problem: Clinical Measurements: Goal: Ability to maintain clinical measurements within normal limits will improve Outcome: Progressing   Problem: Clinical Measurements: Goal: Will remain free from infection Outcome: Progressing   Problem: Clinical Measurements: Goal: Cardiovascular complication will be avoided Outcome: Progressing   

## 2019-09-04 NOTE — Progress Notes (Signed)
Progress Note  Patient Name: Shelly [name] Date of Encounter: 09/04/2019  Primary Cardiologist: No primary care provider on file. New- Dr Acie Fredrickson  Subjective   Felt tightness in her back last night. This has resolved. Breathing is much better and swelling in legs has improved. No chest pain. Does note a history of PAD- sees Dr Oneida Alar. Also history of fracture right wrist with significant reconstruction surgery.   Inpatient Medications    Scheduled Meds: . aspirin  324 mg Oral NOW   Or  . aspirin  300 mg Rectal NOW  . aspirin EC  81 mg Oral Daily  . buPROPion  150 mg Oral Daily  . carvedilol  3.125 mg Oral BID WC  . furosemide  40 mg Intravenous Daily  . levothyroxine  50 mcg Oral Once per day on Sun Sat   And  . [START ON 09/06/2019] levothyroxine  75 mcg Oral Once per day on Mon Tue Wed Thu Fri  . rosuvastatin  10 mg Oral Daily   Continuous Infusions: . heparin 600 Units/hr (09/04/19 0636)  . nitroGLYCERIN 3 mcg/min (09/04/19 0636)   PRN Meds: acetaminophen, ondansetron (ZOFRAN) IV   Vital Signs    Vitals:   09/04/19 0400 09/04/19 0633 09/04/19 0700 09/04/19 0814  BP: (!) 157/77 (!) 147/71 (!) 134/58 126/62  Pulse: 72 86 80 68  Resp: 15  (!) 23 (!) 24  Temp:    (!) 97.5 F (36.4 C)  TempSrc:    Oral  SpO2: 97%  98% 98%  Weight:      Height:        Intake/Output Summary (Last 24 hours) at 09/04/2019 0934 Last data filed at 09/04/2019 0843 Gross per 24 hour  Intake 410.1 ml  Output 1775 ml  Net -1364.9 ml   Last 3 Weights 09/04/2019 09/03/2019 09/02/2019  Weight (lbs) 108 lb 0.4 oz 110 lb 6.4 oz 109 lb 12.6 oz  Weight (kg) 49 kg 50.077 kg 49.8 kg      Telemetry    NSR with PVCs - Personally Reviewed  ECG    NSR with LVH. Extensive T wave inversion in anterolateral and inferior leads - Personally Reviewed  Physical Exam   Thin WF  GEN: No acute distress.   Neck: No JVD Cardiac: RRR, no murmurs, rubs, or gallops.  Respiratory: Clear to auscultation  bilaterally. GI: Soft, nontender, non-distended  MS: 1+ edema; No deformity. Pulses 2+ Neuro:  Nonfocal  Psych: Normal affect   Labs    High Sensitivity Troponin:   Recent Labs  Lab 09/03/19 1637 09/03/19 1837  TROPONINIHS 32* 36*      Chemistry Recent Labs  Lab 09/02/19 0359 09/03/19 1637 09/04/19 0740  NA 140 140 143  K 3.7 3.9 3.6  CL 107 108 109  CO2 23 23 27   GLUCOSE 120* 103* 118*  BUN 32* 31* 23  CREATININE 1.79* 1.79* 1.63*  CALCIUM 9.1 9.3 9.1  GFRNONAA 26* 26* 29*  GFRAA 30* 30* 34*  ANIONGAP 10 9 7      Hematology Recent Labs  Lab 09/02/19 0359 09/03/19 1637 09/04/19 0740  WBC 5.9 7.1 6.0  RBC 4.04 4.21 3.92  HGB 11.6* 12.0 11.4*  HCT 37.5 38.8 36.3  MCV 92.8 92.2 92.6  MCH 28.7 28.5 29.1  MCHC 30.9 30.9 31.4  RDW 12.2 12.3 12.4  PLT 240 229 217    BNP Recent Labs  Lab 09/03/19 1637  BNP 1,032.6*     DDimer No results for  input(s): DDIMER in the last 168 hours.   Radiology    Dg Chest 2 View  Result Date: 09/03/2019 CLINICAL DATA:  Acute chest pain. EXAM: CHEST - 2 VIEW COMPARISON:  09/02/2019 and prior radiographs FINDINGS: UPPER limits normal heart size again noted. There is no evidence of focal airspace disease, pulmonary edema, suspicious pulmonary nodule/mass, pleural effusion, or pneumothorax. LEFT breast surgical changes again noted. No acute bony abnormalities are identified. IMPRESSION: No active cardiopulmonary disease. Electronically Signed   By: Margarette Canada M.D.   On: 09/03/2019 18:24    Cardiac Studies   Echo 08/27/19: IMPRESSIONS    1. Moderate hypokinesis of the left ventricular anteroseptal wall, anterior wall and apical segment.  2. The left ventricle has moderate-severely reduced systolic function, with an ejection fraction of 30-35%. The cavity size was normal. Left ventricular diastolic Doppler parameters are consistent with impaired relaxation. Left ventrical global  hypokinesis without regional wall motion  abnormalities.  3. The right ventricle has normal systolic function. The cavity was normal. There is no increase in right ventricular wall thickness. Right ventricular systolic pressure is moderately elevated with an estimated pressure of 56.1 mmHg.  4. Left atrial size was moderately dilated.  5. Right atrial size was moderately dilated.  6. There is mild mitral annular calcification present. Mitral valve regurgitation is mild to moderate by color flow Doppler. The MR jet is centrally-directed.  7. Tricuspid valve regurgitation is moderate.  8. The aorta is normal unless otherwise noted.  9. The inferior vena cava was dilated in size with <50% respiratory variability. 10. There is right bowing of the interatrial septum, suggestive of elevated left atrial pressure.  Patient Profile     78 y.o. female  Presents with progressive DOE, edema and tachycardia. New onset systolic CHF with EF 26-41% by Echo. Marked ST- T wave changes on Ecg.   Assessment & Plan    1. Acute combined systolic/diastolic CHF. Good diuresis with IV lasix. I/O negative 1284. Renal function is stable/improved. Clinically better. Suspect ischemic cardiomyopathy. Continue Coreg, IV lasix, IV Ntg. Hold ACEi/ARB/Aldactone due to CKD. Plan cardiac cath on Monday if renal function is stable. Plan to hold IV lasix tomorrow pm and gently hydrate for cath. If PCI needed will probably need to be staged. The procedure and risks were reviewed including but not limited to death, myocardial infarction, stroke, arrythmias, bleeding, transfusion, emergency surgery, dye allergy, or renal dysfunction. The patient voices understanding and is agreeable to proceed. Given history of extensive surgery on right wrist/hand I would favor using left radial or femoral approach. 2. CKD stage 3. Stable. Monitor with diuresis 3. Unstable angina. Troponin levels minimal. Marked ST-T changes on Ecg. On IV heparin and Ntg. 4. PAD. 5. COPD 6. HTN improved 7.  Lipid status is unknown. Will check lipid panel. Start statin therapy.    For questions or updates, please contact [place] Please consult [email or web address] for contact info under        Signed, Peter [place], MD  09/04/2019, 9:34 AM

## 2019-09-05 LAB — CBC
HCT: 37.9 % (ref 36.0–46.0)
Hemoglobin: 12 g/dL (ref 12.0–15.0)
MCH: 28.6 pg (ref 26.0–34.0)
MCHC: 31.7 g/dL (ref 30.0–36.0)
MCV: 90.5 fL (ref 80.0–100.0)
Platelets: 238 10*3/uL (ref 150–400)
RBC: 4.19 MIL/uL (ref 3.87–5.11)
RDW: 12.3 % (ref 11.5–15.5)
WBC: 6.9 10*3/uL (ref 4.0–10.5)
nRBC: 0 % (ref 0.0–0.2)

## 2019-09-05 LAB — LIPID PANEL
Cholesterol: 164 mg/dL (ref 0–200)
HDL: 78 mg/dL (ref >40)
LDL Cholesterol: 75 mg/dL (ref 0–99)
Total CHOL/HDL Ratio: 2.1 RATIO
Triglycerides: 57 mg/dL (ref <150)
VLDL: 11 mg/dL (ref 0–40)

## 2019-09-05 LAB — BASIC METABOLIC PANEL
Anion gap: 11 (ref 5–15)
BUN: 38 mg/dL — ABNORMAL HIGH (ref 8–23)
CO2: 25 mmol/L (ref 22–32)
Calcium: 9.1 mg/dL (ref 8.9–10.3)
Chloride: 103 mmol/L (ref 98–111)
Creatinine, Ser: 2.02 mg/dL — ABNORMAL HIGH (ref 0.44–1.00)
GFR calc Af Amer: 26 mL/min — ABNORMAL LOW (ref >60)
GFR calc non Af Amer: 23 mL/min — ABNORMAL LOW (ref >60)
Glucose, Bld: 117 mg/dL — ABNORMAL HIGH (ref 70–99)
Potassium: 3.7 mmol/L (ref 3.5–5.1)
Sodium: 139 mmol/L (ref 135–145)

## 2019-09-05 LAB — HEPARIN LEVEL (UNFRACTIONATED): Heparin Unfractionated: 0.35 IU/mL (ref 0.30–0.70)

## 2019-09-05 MED ORDER — CARVEDILOL 12.5 MG PO TABS
12.5000 mg | ORAL_TABLET | Freq: Two times a day (BID) | ORAL | Status: DC
  Administered 2019-09-05 – 2019-09-09 (×8): 12.5 mg via ORAL
  Filled 2019-09-05 (×8): qty 1

## 2019-09-05 NOTE — Progress Notes (Signed)
ANTICOAGULATION CONSULT NOTE - Follow Up Consult  Pharmacy Consult for Heparin Indication: chest pain/ACS  Allergies  Allergen Reactions  . Morphine And Related Nausea And Vomiting  . Yellow Dyes (Non-Tartrazine) Itching, Rash and Other (See Comments)    SEVERE RASH THAT COVERED THE BACK (lasted 3 months)  . Amoxicillin Diarrhea and Other (See Comments)    Fever spiked at close to 105 F Has patient had a PCN reaction causing immediate rash, facial/tongue/throat swelling, SOB or lightheadedness with hypotension: Yes Has patient had a PCN reaction causing severe rash involving mucus membranes or skin necrosis: Yes Has patient had a PCN reaction that required hospitalization Yes Has patient had a PCN reaction occurring within the last 10 years: No If all of the above answers are "NO", then may proceed with Cephalosporin use.    . Eggs Or Egg-Derived Products Swelling and Other (See Comments)    Eyes, lips, and mouth became swollen- no shortness of breath, however  . Penicillins Diarrhea and Other (See Comments)    Febrile and diarrhea Has patient had a PCN reaction causing immediate rash, facial/tongue/throat swelling, SOB or lightheadedness with hypotension: Yes Has patient had a PCN reaction causing severe rash involving mucus membranes or skin necrosis: Yes Has patient had a PCN reaction that required hospitalization Yes Has patient had a PCN reaction occurring within the last 10 years: No If all of the above answers are "NO", then may proceed with Cephalosporin use.   . Pollen Extract Other (See Comments)    Itchy eyes, runny nose and congestion  . Lamictal [Lamotrigine] Rash    Patient Measurements: Height: 5\' 1"  (154.9 cm) Weight: 109 lb 5.6 oz (49.6 kg) IBW/kg (Calculated) : 47.8 Heparin Dosing Weight: 50.1 kg  Vital Signs: Temp: 98.5 F (36.9 C) (09/13 1153) Temp Source: Oral (09/13 1153) BP: 142/87 (09/13 1153) Pulse Rate: 85 (09/13 1153)  Labs: Recent Labs     09/03/19 1637 09/03/19 1837 09/04/19 0740 09/05/19 0011  HGB 12.0  --  11.4* 12.0  HCT 38.8  --  36.3 37.9  PLT 229  --  217 238  HEPARINUNFRC  --   --  0.20* 0.35  CREATININE 1.79*  --  1.63* 2.02*  TROPONINIHS 32* 36*  --   --     Estimated Creatinine Clearance: 16.8 mL/min (A) (by C-G formula based on SCr of 2.02 mg/dL (H)).   Medical History: Past Medical History:  Diagnosis Date  . Anxiety   . Arthritis    right elbow  . 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 Breast cancer ([place]) 03/2010   left breast s/p lumpectomy; adjuvant radiation; on adjuvant homrnal therapy  . Chronic kidney disease    stage 3  . COPD (chronic obstructive pulmonary disease) ([place])   . Hay fever   . History of breast cancer 09/29/2012  . Hypertension   . Hypothyroidism   . Macular degeneration   . Migraine   . MVP (mitral valve prolapse)   . Osteopenia   . PONV (postoperative nausea and vomiting)   . Right arm fracture   . Sciatic pain   . Scoliosis     Medications:  Scheduled:  . aspirin EC  81 mg Oral Daily  . buPROPion  150 mg Oral Daily  . carvedilol  12.5 mg Oral BID WC  . levothyroxine  50 mcg Oral Once per day on Sun Sat   And  . [START ON 09/06/2019] levothyroxine  75 mcg Oral Once  per day on Mon Tue Wed Thu Fri  . rosuvastatin  10 mg Oral Daily   Infusions:  . heparin 800 Units/hr (09/05/19 0400)  . nitroGLYCERIN 3 mcg/min (09/05/19 1400)    Assessment: 78 y.o. female presenting with palpitations and irregular heart rate. Experiences intense chest pain and lightheadedness at night. PMH significant for CHF, HTN, COPD, CKD stage 3. No anticoagulation PTA.  Pharmacy consulted for heparin dosing. Plan cath Monday  -heparin level at goal, CBC stable  Goal of Therapy:  Heparin level 0.3-0.7 units/ml Monitor platelets by anticoagulation protocol: Yes   Plan:  Continue Heparin gtt at 800 units/hr  Daily HL and CBC  Hildred Laser, PharmD Clinical  Pharmacist **Pharmacist phone directory can now be found on [email or web address] ([email or web address]).  Listed under [place].

## 2019-09-05 NOTE — Plan of Care (Signed)
  Problem: Clinical Measurements: Goal: Ability to maintain clinical measurements within normal limits will improve Outcome: Progressing   Problem: Clinical Measurements: Goal: Will remain free from infection Outcome: Progressing   Problem: Clinical Measurements: Goal: Cardiovascular complication will be avoided Outcome: Progressing   Problem: Nutrition: Goal: Adequate nutrition will be maintained Outcome: Progressing   Problem: Safety: Goal: Ability to remain free from injury will improve Outcome: Progressing

## 2019-09-05 NOTE — Progress Notes (Signed)
Progress Note  Patient Name: Shelly [name] Date of Encounter: 09/05/2019  Primary Cardiologist: No primary care provider on file. New- Dr Acie Fredrickson  Subjective   Felt much better overall yesterday. This am complained of feeling nauseated and heart racing like her initial episodes. BP went up. No chest pain. Does note a history of PAD- sees Dr Oneida Alar. Also history of fracture right wrist with significant reconstruction surgery.   Inpatient Medications    Scheduled Meds:  aspirin EC  81 mg Oral Daily   buPROPion  150 mg Oral Daily   carvedilol  12.5 mg Oral BID WC   levothyroxine  50 mcg Oral Once per day on Sun Sat   And   [START ON 09/06/2019] levothyroxine  75 mcg Oral Once per day on Mon Tue Wed Thu Fri   rosuvastatin  10 mg Oral Daily   Continuous Infusions:  heparin 800 Units/hr (09/05/19 0400)   nitroGLYCERIN 3 mcg/min (09/05/19 0400)   PRN Meds: acetaminophen, ondansetron (ZOFRAN) IV   Vital Signs    Vitals:   09/05/19 0000 09/05/19 0400 09/05/19 0432 09/05/19 0622  BP: 131/65 (!) 167/90 (!) 177/96 132/70  Pulse: 71 92 87 78  Resp: 16 16 (!) 22   Temp:   98.3 F (36.8 C)   TempSrc:   Oral   SpO2: 96% 100% 100%   Weight:  49.6 kg    Height:        Intake/Output Summary (Last 24 hours) at 09/05/2019 0736 Last data filed at 09/05/2019 0400 Gross per 24 hour  Intake 734.06 ml  Output 2150 ml  Net -1415.94 ml   Last 3 Weights 09/05/2019 09/04/2019 09/03/2019  Weight (lbs) 109 lb 5.6 oz 108 lb 0.4 oz 110 lb 6.4 oz  Weight (kg) 49.6 kg 49 kg 50.077 kg      Telemetry    NSR with PVCs - Personally Reviewed  ECG    NSR with LVH. Extensive T wave inversion in anterolateral and inferior leads - Personally Reviewed  Physical Exam   Thin WF  GEN: No acute distress.   Neck: No JVD Cardiac: RRR, no murmurs, rubs, or gallops.  Respiratory: Clear to auscultation bilaterally. GI: Soft, nontender, non-distended  MS: Tr edema; No deformity. Pulses 2+ Neuro:   Nonfocal  Psych: Normal affect   Labs    High Sensitivity Troponin:   Recent Labs  Lab 09/03/19 1637 09/03/19 1837  TROPONINIHS 32* 36*      Chemistry Recent Labs  Lab 09/03/19 1637 09/04/19 0740 09/05/19 0011  NA 140 143 139  K 3.9 3.6 3.7  CL 108 109 103  CO2 23 27 25   GLUCOSE 103* 118* 117*  BUN 31* 23 38*  CREATININE 1.79* 1.63* 2.02*  CALCIUM 9.3 9.1 9.1  GFRNONAA 26* 29* 23*  GFRAA 30* 34* 26*  ANIONGAP 9 7 11      Hematology Recent Labs  Lab 09/03/19 1637 09/04/19 0740 09/05/19 0011  WBC 7.1 6.0 6.9  RBC 4.21 3.92 4.19  HGB 12.0 11.4* 12.0  HCT 38.8 36.3 37.9  MCV 92.2 92.6 90.5  MCH 28.5 29.1 28.6  MCHC 30.9 31.4 31.7  RDW 12.3 12.4 12.3  PLT 229 217 238    BNP Recent Labs  Lab 09/03/19 1637  BNP 1,032.6*     DDimer No results for input(s): DDIMER in the last 168 hours.   Radiology    Dg Chest 2 View  Result Date: 09/03/2019 CLINICAL DATA:  Acute chest pain. EXAM:  CHEST - 2 VIEW COMPARISON:  09/02/2019 and prior radiographs FINDINGS: UPPER limits normal heart size again noted. There is no evidence of focal airspace disease, pulmonary edema, suspicious pulmonary nodule/mass, pleural effusion, or pneumothorax. LEFT breast surgical changes again noted. No acute bony abnormalities are identified. IMPRESSION: No active cardiopulmonary disease. Electronically Signed   By: Margarette Canada M.D.   On: 09/03/2019 18:24    Cardiac Studies   Echo 08/27/19: IMPRESSIONS    1. Moderate hypokinesis of the left ventricular anteroseptal wall, anterior wall and apical segment.  2. The left ventricle has moderate-severely reduced systolic function, with an ejection fraction of 30-35%. The cavity size was normal. Left ventricular diastolic Doppler parameters are consistent with impaired relaxation. Left ventrical global  hypokinesis without regional wall motion abnormalities.  3. The right ventricle has normal systolic function. The cavity was normal. There is  no increase in right ventricular wall thickness. Right ventricular systolic pressure is moderately elevated with an estimated pressure of 56.1 mmHg.  4. Left atrial size was moderately dilated.  5. Right atrial size was moderately dilated.  6. There is mild mitral annular calcification present. Mitral valve regurgitation is mild to moderate by color flow Doppler. The MR jet is centrally-directed.  7. Tricuspid valve regurgitation is moderate.  8. The aorta is normal unless otherwise noted.  9. The inferior vena cava was dilated in size with <50% respiratory variability. 10. There is right bowing of the interatrial septum, suggestive of elevated left atrial pressure.  Patient Profile     78 y.o. female  Presents with progressive DOE, edema and tachycardia. New onset systolic CHF with EF 63-78% by Echo. Marked ST- T wave changes on Ecg.   Assessment & Plan    1. Acute combined systolic/diastolic CHF. Good diuresis with IV lasix. I/O negative 1415 yesterday and 2.7 liters overall. Renal function is worse today.  Clinically better. Suspect ischemic cardiomyopathy. Continue Coreg,  IV Ntg. Hold ACEi/ARB/Aldactone due to CKD. Will increase Coreg today. Will hold further diuresis today. Gently hydrate tonight. Plan cardiac cath on Monday if renal function is better. If PCI needed will probably need to be staged. . Given history of extensive surgery on right wrist/hand I would favor using left radial or femoral approach. 2. CKD stage 3. Stable. Monitor with diuresis. See above 3. Unstable angina. Troponin levels minimal. Marked ST-T changes on Ecg. On IV heparin and Ntg. 4. PAD. 5. COPD 6. HTN improved 7. Dyslipidemia. LDL 75. Start statin therapy.    For questions or updates, please contact [place] Please consult [email or web address] for contact info under        Signed, Shatara Stanek [place], MD  09/05/2019, 7:36 AM

## 2019-09-06 ENCOUNTER — Ambulatory Visit (HOSPITAL_COMMUNITY): Admission: RE | Admit: 2019-09-06 | Payer: Medicare Other | Source: Home / Self Care | Admitting: Cardiology

## 2019-09-06 DIAGNOSIS — I5023 Acute on chronic systolic (congestive) heart failure: Secondary | ICD-10-CM

## 2019-09-06 DIAGNOSIS — N184 Chronic kidney disease, stage 4 (severe): Secondary | ICD-10-CM

## 2019-09-06 LAB — HEPARIN LEVEL (UNFRACTIONATED): Heparin Unfractionated: 0.41 IU/mL (ref 0.30–0.70)

## 2019-09-06 LAB — BASIC METABOLIC PANEL
Anion gap: 10 (ref 5–15)
Anion gap: 7 (ref 5–15)
BUN: 45 mg/dL — ABNORMAL HIGH (ref 8–23)
BUN: 43 mg/dL — ABNORMAL HIGH (ref 8–23)
CO2: 24 mmol/L (ref 22–32)
CO2: 25 mmol/L (ref 22–32)
Calcium: 9.1 mg/dL (ref 8.9–10.3)
Calcium: 8.8 mg/dL — ABNORMAL LOW (ref 8.9–10.3)
Chloride: 104 mmol/L (ref 98–111)
Chloride: 108 mmol/L (ref 98–111)
Creatinine, Ser: 2.35 mg/dL — ABNORMAL HIGH (ref 0.44–1.00)
Creatinine, Ser: 2.26 mg/dL — ABNORMAL HIGH (ref 0.44–1.00)
GFR calc Af Amer: 22 mL/min — ABNORMAL LOW (ref >60)
GFR calc Af Amer: 23 mL/min — ABNORMAL LOW (ref >60)
GFR calc non Af Amer: 19 mL/min — ABNORMAL LOW (ref >60)
GFR calc non Af Amer: 20 mL/min — ABNORMAL LOW (ref >60)
Glucose, Bld: 103 mg/dL — ABNORMAL HIGH (ref 70–99)
Glucose, Bld: 106 mg/dL — ABNORMAL HIGH (ref 70–99)
Potassium: 4.2 mmol/L (ref 3.5–5.1)
Potassium: 4.7 mmol/L (ref 3.5–5.1)
Sodium: 138 mmol/L (ref 135–145)
Sodium: 140 mmol/L (ref 135–145)

## 2019-09-06 LAB — CBC
HCT: 35.8 % — ABNORMAL LOW (ref 36.0–46.0)
Hemoglobin: 11.3 g/dL — ABNORMAL LOW (ref 12.0–15.0)
MCH: 28.4 pg (ref 26.0–34.0)
MCHC: 31.6 g/dL (ref 30.0–36.0)
MCV: 89.9 fL (ref 80.0–100.0)
Platelets: 215 10*3/uL (ref 150–400)
RBC: 3.98 MIL/uL (ref 3.87–5.11)
RDW: 12.3 % (ref 11.5–15.5)
WBC: 7.6 10*3/uL (ref 4.0–10.5)
nRBC: 0 % (ref 0.0–0.2)

## 2019-09-06 MED ORDER — SODIUM CHLORIDE 0.9% FLUSH: 3.0000 mL | INTRAVENOUS | Status: DC | PRN

## 2019-09-06 MED ORDER — SODIUM CHLORIDE 0.9 % WEIGHT BASED INFUSION
1.0000 mL/kg/h | INTRAVENOUS | Status: DC
  Administered 2019-09-06: 05:00:00 1 mL/kg/h via INTRAVENOUS

## 2019-09-06 MED ORDER — SODIUM CHLORIDE 0.9 % IV SOLN: 250.0000 mL | INTRAVENOUS | Status: DC | PRN

## 2019-09-06 MED ORDER — SODIUM CHLORIDE 0.9 % WEIGHT BASED INFUSION: 1.0000 mL/kg/h | INTRAVENOUS | Status: AC

## 2019-09-06 MED ORDER — SODIUM CHLORIDE 0.9 % IV SOLN: INTRAVENOUS | Status: DC

## 2019-09-06 MED ORDER — ASPIRIN 81 MG PO CHEW
81.0000 mg | CHEWABLE_TABLET | ORAL | Status: AC
  Administered 2019-09-06: 06:00:00 81 mg via ORAL
  Filled 2019-09-06: qty 1

## 2019-09-06 MED ORDER — SODIUM CHLORIDE 0.9% FLUSH
3.0000 mL | Freq: Two times a day (BID) | INTRAVENOUS | Status: DC
  Administered 2019-09-06 – 2019-09-09 (×4): 3 mL via INTRAVENOUS

## 2019-09-06 NOTE — H&P (View-Only) (Signed)
The patient has been seen in conjunction with Reino Bellis, NP. All aspects of care have been considered and discussed. The patient has been personally interviewed, examined, and all clinical data has been reviewed.   Acute on chronic combined systolic and diastolic heart failure, improved with diuresis.  Acute on chronic kidney disease stage III- IV, aggravated by diuresis.  With rising creatinine today, we will continue gentle hydration and postpone coronary angiography until creatinine is optimized.  Differential diagnosis includes CAD versus stress cardiomyopathy.  Progress Note  Patient Name: Shelly [name] Date of Encounter: 09/06/2019  Primary Cardiologist: No primary care provider on file.   Subjective   Overall feeling much better. No chest pain.   Inpatient Medications    Scheduled Meds:  aspirin EC  81 mg Oral Daily   buPROPion  150 mg Oral Daily   carvedilol  12.5 mg Oral BID WC   levothyroxine  50 mcg Oral Once per day on Sun Sat   And   levothyroxine  75 mcg Oral Once per day on Mon Tue Wed Thu Fri   rosuvastatin  10 mg Oral Daily   sodium chloride flush  3 mL Intravenous Q12H   Continuous Infusions:  sodium chloride     sodium chloride 10 mL/hr at 09/06/19 0515   sodium chloride 1 mL/kg/hr (09/06/19 0515)   heparin 800 Units/hr (09/06/19 0515)   nitroGLYCERIN 3 mcg/min (09/06/19 0515)   PRN Meds: sodium chloride, acetaminophen, ondansetron (ZOFRAN) IV, sodium chloride flush   Vital Signs    Vitals:   09/05/19 2347 09/06/19 0000 09/06/19 0500 09/06/19 0505  BP: 119/60  (!) 142/66   Pulse: 74 73 71   Resp: 16 17 20    Temp: 98.4 F (36.9 C)  98.5 F (36.9 C)   TempSrc: Oral  Oral   SpO2: 97% 96% 96%   Weight:    47.8 kg  Height:        Intake/Output Summary (Last 24 hours) at 09/06/2019 0758 Last data filed at 09/06/2019 0515 Gross per 24 hour  Intake 790.72 ml  Output 475 ml  Net 315.72 ml   Last 3 Weights 09/06/2019  09/05/2019 09/04/2019  Weight (lbs) 105 lb 6.1 oz 109 lb 5.6 oz 108 lb 0.4 oz  Weight (kg) 47.8 kg 49.6 kg 49 kg      Telemetry    SR with PVCs - Personally Reviewed  ECG    No new tracing this morning - Personally Reviewed  Physical Exam  Thin older WF, sitting up in bed. GEN: No acute distress.   Neck: No JVD Cardiac: RRR, no murmurs, rubs, or gallops.  Respiratory: Clear to auscultation bilaterally. GI: Soft, nontender, non-distended  MS: No edema; No deformity. Neuro:  Nonfocal  Psych: Normal affect   Labs    High Sensitivity Troponin:   Recent Labs  Lab 09/03/19 1637 09/03/19 1837  TROPONINIHS 32* 36*      Chemistry Recent Labs  Lab 09/04/19 0740 09/05/19 0011 09/06/19 0313  NA 143 139 138  K 3.6 3.7 4.2  CL 109 103 104  CO2 27 25 24   GLUCOSE 118* 117* 103*  BUN 23 38* 45*  CREATININE 1.63* 2.02* 2.35*  CALCIUM 9.1 9.1 9.1  GFRNONAA 29* 23* 19*  GFRAA 34* 26* 22*  ANIONGAP 7 11 10      Hematology Recent Labs  Lab 09/04/19 0740 09/05/19 0011 09/06/19 0313  WBC 6.0 6.9 7.6  RBC 3.92 4.19 3.98  HGB 11.4* 12.0 11.3*  HCT 36.3 37.9 35.8*  MCV 92.6 90.5 89.9  MCH 29.1 28.6 28.4  MCHC 31.4 31.7 31.6  RDW 12.4 12.3 12.3  PLT 217 238 215    BNP Recent Labs  Lab 09/03/19 1637  BNP 1,032.6*     DDimer No results for input(s): DDIMER in the last 168 hours.   Radiology    No results found.  Cardiac Studies   Echo: 08/27/19  1. Moderate hypokinesis of the left ventricular anteroseptal wall, anterior wall and apical segment. 2. The left ventricle has moderate-severely reduced systolic function, with an ejection fraction of 30-35%. The cavity size was normal. Left ventricular diastolic Doppler parameters are consistent with impaired relaxation. Left ventrical global  hypokinesis without regional wall motion abnormalities. 3. The right ventricle has normal systolic function. The cavity was normal. There is no increase in right ventricular  wall thickness. Right ventricular systolic pressure is moderately elevated with an estimated pressure of 56.1 mmHg. 4. Left atrial size was moderately dilated. 5. Right atrial size was moderately dilated. 6. There is mild mitral annular calcification present. Mitral valve regurgitation is mild to moderate by color flow Doppler. The MR jet is centrally-directed. 7. Tricuspid valve regurgitation is moderate. 8. The aorta is normal unless otherwise noted. 9. The inferior vena cava was dilated in size with <50% respiratory variability. 10. There is right bowing of the interatrial septum, suggestive of elevated left atrial pressure.  Patient Profile     78 y.o. female Presents with progressive DOE, edema and tachycardia. New onset systolic CHF with EF 03-15% by Echo. Marked ST- T wave changes on Ecg.   Assessment & Plan    1. Acute combined systolic/diastolic CHF: Good diuresis with IV lasix. Net -2.1L total. IV lasix now held in anticipation for cath. Unfortunately renal function is worse today 2.02>>2.35. Will hold on cath today and plan for tomorrow if improvement in Cr. Will continue gentle IVFs until this afternoon and recheck Cr.  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 Continue Coreg,  IV Ntg. Hold ACEi/ARB/Aldactone due to CKD. -- Given history of extensive surgery on right wrist/hand, favor using left radial or femoral approach (per Dr. Martinique).  2. CKD stage 3. Stable: Monitor with diuresis. See above  3. Unstable angina: Troponin levels minimal. Marked ST-T changes on Ecg. No chest pain. -- On IV heparin and Ntg.  4. PAD: followed by DR. Fields  5. COPD: stable  6. HTN: stable with BB therapy and nitro gtt.   7. Dyslipidemia: LDL 75. Consider increasing Crestor to 20mg  pending cath findings.   For questions or updates, please contact [place] Please consult [email or web address] for contact info under    Signed, Reino Bellis, NP  09/06/2019, 7:58 AM

## 2019-09-06 NOTE — Progress Notes (Addendum)
The patient has been seen in conjunction with Reino Bellis, NP. All aspects of care have been considered and discussed. The patient has been personally interviewed, examined, and all clinical data has been reviewed.   Acute on chronic combined systolic and diastolic heart failure, improved with diuresis.  Acute on chronic kidney disease stage IV, aggravated by diuresis.  With rising creatinine today, we will continue gentle hydration and postpone coronary angiography until creatinine is optimized.  Differential diagnosis includes CAD versus stress cardiomyopathy.  Progress Note  Patient Name: Shelly [name] Date of Encounter: 09/06/2019  Primary Cardiologist: No primary care provider on file.   Subjective   Overall feeling much better. No chest pain.   Inpatient Medications    Scheduled Meds:  aspirin EC  81 mg Oral Daily   buPROPion  150 mg Oral Daily   carvedilol  12.5 mg Oral BID WC   levothyroxine  50 mcg Oral Once per day on Sun Sat   And   levothyroxine  75 mcg Oral Once per day on Mon Tue Wed Thu Fri   rosuvastatin  10 mg Oral Daily   sodium chloride flush  3 mL Intravenous Q12H   Continuous Infusions:  sodium chloride     sodium chloride 10 mL/hr at 09/06/19 0515   sodium chloride 1 mL/kg/hr (09/06/19 0515)   heparin 800 Units/hr (09/06/19 0515)   nitroGLYCERIN 3 mcg/min (09/06/19 0515)   PRN Meds: sodium chloride, acetaminophen, ondansetron (ZOFRAN) IV, sodium chloride flush   Vital Signs    Vitals:   09/05/19 2347 09/06/19 0000 09/06/19 0500 09/06/19 0505  BP: 119/60  (!) 142/66   Pulse: 74 73 71   Resp: 16 17 20    Temp: 98.4 F (36.9 C)  98.5 F (36.9 C)   TempSrc: Oral  Oral   SpO2: 97% 96% 96%   Weight:    47.8 kg  Height:        Intake/Output Summary (Last 24 hours) at 09/06/2019 0758 Last data filed at 09/06/2019 0515 Gross per 24 hour  Intake 790.72 ml  Output 475 ml  Net 315.72 ml   Last 3 Weights 09/06/2019 09/05/2019  09/04/2019  Weight (lbs) 105 lb 6.1 oz 109 lb 5.6 oz 108 lb 0.4 oz  Weight (kg) 47.8 kg 49.6 kg 49 kg      Telemetry    SR with PVCs - Personally Reviewed  ECG    No new tracing this morning - Personally Reviewed  Physical Exam  Thin older WF, sitting up in bed. GEN: No acute distress.   Neck: No JVD Cardiac: RRR, no murmurs, rubs, or gallops.  Respiratory: Clear to auscultation bilaterally. GI: Soft, nontender, non-distended  MS: No edema; No deformity. Neuro:  Nonfocal  Psych: Normal affect   Labs    High Sensitivity Troponin:   Recent Labs  Lab 09/03/19 1637 09/03/19 1837  TROPONINIHS 32* 36*      Chemistry Recent Labs  Lab 09/04/19 0740 09/05/19 0011 09/06/19 0313  NA 143 139 138  K 3.6 3.7 4.2  CL 109 103 104  CO2 27 25 24   GLUCOSE 118* 117* 103*  BUN 23 38* 45*  CREATININE 1.63* 2.02* 2.35*  CALCIUM 9.1 9.1 9.1  GFRNONAA 29* 23* 19*  GFRAA 34* 26* 22*  ANIONGAP 7 11 10      Hematology Recent Labs  Lab 09/04/19 0740 09/05/19 0011 09/06/19 0313  WBC 6.0 6.9 7.6  RBC 3.92 4.19 3.98  HGB 11.4* 12.0 11.3*  HCT 36.3 37.9 35.8*  MCV 92.6 90.5 89.9  MCH 29.1 28.6 28.4  MCHC 31.4 31.7 31.6  RDW 12.4 12.3 12.3  PLT 217 238 215    BNP Recent Labs  Lab 09/03/19 1637  BNP 1,032.6*     DDimer No results for input(s): DDIMER in the last 168 hours.   Radiology    No results found.  Cardiac Studies   Echo: 08/27/19  1. Moderate hypokinesis of the left ventricular anteroseptal wall, anterior wall and apical segment. 2. The left ventricle has moderate-severely reduced systolic function, with an ejection fraction of 30-35%. The cavity size was normal. Left ventricular diastolic Doppler parameters are consistent with impaired relaxation. Left ventrical global  hypokinesis without regional wall motion abnormalities. 3. The right ventricle has normal systolic function. The cavity was normal. There is no increase in right ventricular wall  thickness. Right ventricular systolic pressure is moderately elevated with an estimated pressure of 56.1 mmHg. 4. Left atrial size was moderately dilated. 5. Right atrial size was moderately dilated. 6. There is mild mitral annular calcification present. Mitral valve regurgitation is mild to moderate by color flow Doppler. The MR jet is centrally-directed. 7. Tricuspid valve regurgitation is moderate. 8. The aorta is normal unless otherwise noted. 9. The inferior vena cava was dilated in size with <50% respiratory variability. 10. There is right bowing of the interatrial septum, suggestive of elevated left atrial pressure.  Patient Profile     78 y.o. female Presents with progressive DOE, edema and tachycardia. New onset systolic CHF with EF 40-98% by Echo. Marked ST- T wave changes on Ecg.   Assessment & Plan    1. Acute combined systolic/diastolic CHF: Good diuresis with IV lasix. Net -2.1L total. IV lasix now held in anticipation for cath. Unfortunately renal function is worse today 2.02>>2.35. Will hold on cath today and plan for tomorrow if improvement in Cr. Will continue gentle IVFs until this afternoon and recheck Cr.  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 Continue Coreg,  IV Ntg. Hold ACEi/ARB/Aldactone due to CKD. -- Given history of extensive surgery on right wrist/hand, favor using left radial or femoral approach (per Dr. Martinique).  2. CKD stage 4. Stable: Monitor with diuresis. See above  3. Unstable angina: Troponin levels minimal. Marked ST-T changes on Ecg. No chest pain. -- On IV heparin and Ntg.  4. PAD: followed by DR. Fields  5. COPD: stable  6. HTN: stable with BB therapy and nitro gtt.   7. Dyslipidemia: LDL 75. Consider increasing Crestor to 20mg  pending cath findings.   For questions or updates, please contact [place] Please consult [email or web address] for contact info under    Signed, Reino Bellis, NP  09/06/2019, 7:58 AM

## 2019-09-06 NOTE — Progress Notes (Signed)
ANTICOAGULATION CONSULT NOTE - Follow Up Consult  Pharmacy Consult for Heparin Indication: chest pain/ACS  Allergies  Allergen Reactions  . Morphine And Related Nausea And Vomiting  . Yellow Dyes (Non-Tartrazine) Itching, Rash and Other (See Comments)    SEVERE RASH THAT COVERED THE BACK (lasted 3 months)  . Amoxicillin Diarrhea and Other (See Comments)    Fever spiked at close to 105 F Has patient had a PCN reaction causing immediate rash, facial/tongue/throat swelling, SOB or lightheadedness with hypotension: Yes Has patient had a PCN reaction causing severe rash involving mucus membranes or skin necrosis: Yes Has patient had a PCN reaction that required hospitalization Yes Has patient had a PCN reaction occurring within the last 10 years: No If all of the above answers are "NO", then may proceed with Cephalosporin use.    . Eggs Or Egg-Derived Products Swelling and Other (See Comments)    Eyes, lips, and mouth became swollen- no shortness of breath, however  . Penicillins Diarrhea and Other (See Comments)    Febrile and diarrhea Has patient had a PCN reaction causing immediate rash, facial/tongue/throat swelling, SOB or lightheadedness with hypotension: Yes Has patient had a PCN reaction causing severe rash involving mucus membranes or skin necrosis: Yes Has patient had a PCN reaction that required hospitalization Yes Has patient had a PCN reaction occurring within the last 10 years: No If all of the above answers are "NO", then may proceed with Cephalosporin use.   . Pollen Extract Other (See Comments)    Itchy eyes, runny nose and congestion  . Lamictal [Lamotrigine] Rash    Patient Measurements: Height: 5\' 1"  (154.9 cm) Weight: 105 lb 6.1 oz (47.8 kg) IBW/kg (Calculated) : 47.8 Heparin Dosing Weight: 50.1 kg  Vital Signs: Temp: 98.5 F (36.9 C) (09/14 0500) Temp Source: Oral (09/14 0500) BP: 142/66 (09/14 0500) Pulse Rate: 71 (09/14 0500)  Labs: Recent Labs   09/03/19 1637 09/03/19 1837 09/04/19 0740 09/05/19 0011 09/06/19 0313  HGB 12.0  --  11.4* 12.0 11.3*  HCT 38.8  --  36.3 37.9 35.8*  PLT 229  --  217 238 215  HEPARINUNFRC  --   --  0.20* 0.35 0.41  CREATININE 1.79*  --  1.63* 2.02* 2.35*  TROPONINIHS 32* 36*  --   --   --     Estimated Creatinine Clearance: 14.4 mL/min (A) (by C-G formula based on SCr of 2.35 mg/dL (H)).   Medical History: Past Medical History:  Diagnosis Date  . Anxiety   . Arthritis    right elbow  . 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 Breast cancer ([place]) 03/2010   left breast s/p lumpectomy; adjuvant radiation; on adjuvant homrnal therapy  . Chronic kidney disease    stage 3  . COPD (chronic obstructive pulmonary disease) ([place])   . Hay fever   . History of breast cancer 09/29/2012  . Hypertension   . Hypothyroidism   . Macular degeneration   . Migraine   . MVP (mitral valve prolapse)   . Osteopenia   . PONV (postoperative nausea and vomiting)   . Right arm fracture   . Sciatic pain   . Scoliosis     Medications:  Scheduled:  . aspirin EC  81 mg Oral Daily  . buPROPion  150 mg Oral Daily  . carvedilol  12.5 mg Oral BID WC  . levothyroxine  50 mcg Oral Once per day on Sun Sat   And  . levothyroxine  75 mcg Oral Once per day on Mon Tue Wed Thu Fri  . rosuvastatin  10 mg Oral Daily  . sodium chloride flush  3 mL Intravenous Q12H   Infusions:  . sodium chloride    . sodium chloride 10 mL/hr at 09/06/19 0515  . sodium chloride 1 mL/kg/hr (09/06/19 0515)  . heparin 800 Units/hr (09/06/19 0515)  . nitroGLYCERIN 3 mcg/min (09/06/19 0515)    Assessment: 78 y.o. female presenting with palpitations and irregular heart rate. Experiences intense chest pain and lightheadedness at night. PMH significant for CHF, HTN, COPD, CKD stage 3. No anticoagulation PTA.  Pharmacy consulted for heparin dosing.  Heparin level within goal range this AM.  CBC stable, no bleeding or complications noted.   Awaiting cath once Scr improves.  Goal of Therapy:  Heparin level 0.3-0.7 units/ml Monitor platelets by anticoagulation protocol: Yes   Plan:  Continue Heparin gtt at 800 units/hr  Daily heparin level and CBC.  Marguerite Olea, [facility] Clinical Pharmacist Phone [phone]  09/06/2019 8:00 AM

## 2019-09-07 ENCOUNTER — Encounter (HOSPITAL_COMMUNITY)
Admission: EM | Disposition: A | Discharge status: Home / Self Care | Payer: Self-pay | Source: Home / Self Care | Attending: Cardiovascular Disease

## 2019-09-07 ENCOUNTER — Encounter (HOSPITAL_COMMUNITY): Payer: Self-pay | Admitting: Cardiovascular Disease

## 2019-09-07 DIAGNOSIS — I214 Non-ST elevation (NSTEMI) myocardial infarction: Secondary | ICD-10-CM

## 2019-09-07 DIAGNOSIS — I5021 Acute systolic (congestive) heart failure: Secondary | ICD-10-CM

## 2019-09-07 DIAGNOSIS — I2510 Atherosclerotic heart disease of native coronary artery without angina pectoris: Secondary | ICD-10-CM

## 2019-09-07 HISTORY — PX: LEFT HEART CATH AND CORONARY ANGIOGRAPHY: CATH118249

## 2019-09-07 LAB — CBC
HCT: 34 % — ABNORMAL LOW (ref 36.0–46.0)
Hemoglobin: 10.5 g/dL — ABNORMAL LOW (ref 12.0–15.0)
MCH: 29.1 pg (ref 26.0–34.0)
MCHC: 30.9 g/dL (ref 30.0–36.0)
MCV: 94.2 fL (ref 80.0–100.0)
Platelets: 189 10*3/uL (ref 150–400)
RBC: 3.61 MIL/uL — ABNORMAL LOW (ref 3.87–5.11)
RDW: 12.4 % (ref 11.5–15.5)
WBC: 6.2 10*3/uL (ref 4.0–10.5)
nRBC: 0 % (ref 0.0–0.2)

## 2019-09-07 LAB — BASIC METABOLIC PANEL
Anion gap: 8 (ref 5–15)
BUN: 43 mg/dL — ABNORMAL HIGH (ref 8–23)
CO2: 19 mmol/L — ABNORMAL LOW (ref 22–32)
Calcium: 8.6 mg/dL — ABNORMAL LOW (ref 8.9–10.3)
Chloride: 112 mmol/L — ABNORMAL HIGH (ref 98–111)
Creatinine, Ser: 1.85 mg/dL — ABNORMAL HIGH (ref 0.44–1.00)
GFR calc Af Amer: 29 mL/min — ABNORMAL LOW (ref >60)
GFR calc non Af Amer: 25 mL/min — ABNORMAL LOW (ref >60)
Glucose, Bld: 105 mg/dL — ABNORMAL HIGH (ref 70–99)
Potassium: 4.8 mmol/L (ref 3.5–5.1)
Sodium: 139 mmol/L (ref 135–145)

## 2019-09-07 LAB — HEPARIN LEVEL (UNFRACTIONATED): Heparin Unfractionated: 0.31 IU/mL (ref 0.30–0.70)

## 2019-09-07 SURGERY — LEFT HEART CATH AND CORONARY ANGIOGRAPHY
Anesthesia: LOCAL

## 2019-09-07 MED ORDER — ISOSORB DINITRATE-HYDRALAZINE 20-37.5 MG PO TABS
1.0000 | ORAL_TABLET | Freq: Two times a day (BID) | ORAL | Status: AC
  Administered 2019-09-07 – 2019-09-08 (×4): 1 via ORAL
  Filled 2019-09-07 (×4): qty 1

## 2019-09-07 MED ORDER — HYDRALAZINE HCL 20 MG/ML IJ SOLN: 10.0000 mg | INTRAMUSCULAR | Status: AC | PRN

## 2019-09-07 MED ORDER — SODIUM CHLORIDE 0.9% FLUSH: 3.0000 mL | INTRAVENOUS | Status: DC | PRN

## 2019-09-07 MED ORDER — SODIUM CHLORIDE 0.9 % IV SOLN: 250.0000 mL | INTRAVENOUS | Status: DC | PRN

## 2019-09-07 MED ORDER — SODIUM CHLORIDE 0.9% FLUSH
3.0000 mL | Freq: Two times a day (BID) | INTRAVENOUS | Status: DC
  Administered 2019-09-08: 22:00:00 3 mL via INTRAVENOUS

## 2019-09-07 MED ORDER — ROSUVASTATIN CALCIUM 20 MG PO TABS
20.0000 mg | ORAL_TABLET | Freq: Every day | ORAL | Status: DC
  Administered 2019-09-07 – 2019-09-08 (×2): 20 mg via ORAL
  Filled 2019-09-07 (×2): qty 1

## 2019-09-07 MED ORDER — LIDOCAINE HCL (PF) 1 % IJ SOLN
INTRAMUSCULAR | Status: DC | PRN
  Administered 2019-09-07: 10 mL

## 2019-09-07 MED ORDER — MIDAZOLAM HCL 2 MG/2ML IJ SOLN
INTRAMUSCULAR | Status: DC | PRN
  Administered 2019-09-07: 1 mg via INTRAVENOUS

## 2019-09-07 MED ORDER — LABETALOL HCL 5 MG/ML IV SOLN: 10.0000 mg | INTRAVENOUS | Status: AC | PRN

## 2019-09-07 MED ORDER — HEPARIN (PORCINE) IN NACL 1000-0.9 UT/500ML-% IV SOLN
INTRAVENOUS | Status: DC | PRN
  Administered 2019-09-07 (×2): 500 mL

## 2019-09-07 MED ORDER — SODIUM CHLORIDE 0.9 % WEIGHT BASED INFUSION: 1.0000 mL/kg/h | INTRAVENOUS | Status: AC

## 2019-09-07 MED ORDER — FENTANYL CITRATE (PF) 100 MCG/2ML IJ SOLN
INTRAMUSCULAR | Status: DC | PRN
  Administered 2019-09-07: 25 ug via INTRAVENOUS

## 2019-09-07 MED ORDER — IOHEXOL 350 MG/ML SOLN
INTRAVENOUS | Status: DC | PRN
  Administered 2019-09-07: 20 mL

## 2019-09-07 SURGICAL SUPPLY — 9 items
CATH INFINITI 5FR MULTPACK ANG (CATHETERS) ×1 IMPLANT
CLOSURE MYNX CONTROL 5F (Vascular Products) ×1 IMPLANT
KIT HEART LEFT (KITS) ×2 IMPLANT
PACK CARDIAC CATHETERIZATION (CUSTOM PROCEDURE TRAY) ×2 IMPLANT
SHEATH PINNACLE 5F 10CM (SHEATH) ×1 IMPLANT
SHEATH PROBE COVER 6X72 (BAG) ×1 IMPLANT
TRANSDUCER W/STOPCOCK (MISCELLANEOUS) ×2 IMPLANT
TUBING CIL FLEX 10 FLL-RA (TUBING) ×2 IMPLANT
WIRE EMERALD 3MM-J .035X150CM (WIRE) ×1 IMPLANT

## 2019-09-07 NOTE — Progress Notes (Signed)
ANTICOAGULATION CONSULT NOTE - Follow Up Consult  Pharmacy Consult for Heparin Indication: chest pain/ACS  Allergies  Allergen Reactions  . Morphine And Related Nausea And Vomiting  . Yellow Dyes (Non-Tartrazine) Itching, Rash and Other (See Comments)    SEVERE RASH THAT COVERED THE BACK (lasted 3 months)  . Amoxicillin Diarrhea and Other (See Comments)    Fever spiked at close to 105 F Has patient had a PCN reaction causing immediate rash, facial/tongue/throat swelling, SOB or lightheadedness with hypotension: Yes Has patient had a PCN reaction causing severe rash involving mucus membranes or skin necrosis: Yes Has patient had a PCN reaction that required hospitalization Yes Has patient had a PCN reaction occurring within the last 10 years: No If all of the above answers are "NO", then may proceed with Cephalosporin use.    . Eggs Or Egg-Derived Products Swelling and Other (See Comments)    Eyes, lips, and mouth became swollen- no shortness of breath, however  . Penicillins Diarrhea and Other (See Comments)    Febrile and diarrhea Has patient had a PCN reaction causing immediate rash, facial/tongue/throat swelling, SOB or lightheadedness with hypotension: Yes Has patient had a PCN reaction causing severe rash involving mucus membranes or skin necrosis: Yes Has patient had a PCN reaction that required hospitalization Yes Has patient had a PCN reaction occurring within the last 10 years: No If all of the above answers are "NO", then may proceed with Cephalosporin use.   . Pollen Extract Other (See Comments)    Itchy eyes, runny nose and congestion  . Lamictal [Lamotrigine] Rash    Patient Measurements: Height: 5\' 1"  (154.9 cm) Weight: 105 lb 6.1 oz (47.8 kg) IBW/kg (Calculated) : 47.8 Heparin Dosing Weight: 50.1 kg  Vital Signs: Temp: 97.8 F (36.6 C) (09/15 0412) Temp Source: Oral (09/15 0412) BP: 140/69 (09/15 0412) Pulse Rate: 66 (09/15 0412)  Labs: Recent Labs   09/05/19 0011 09/06/19 0313 09/06/19 1522 09/07/19 0245  HGB 12.0 11.3*  --  10.5*  HCT 37.9 35.8*  --  34.0*  PLT 238 215  --  189  HEPARINUNFRC 0.35 0.41  --  0.31  CREATININE 2.02* 2.35* 2.26* 1.85*    Estimated Creatinine Clearance: 18.3 mL/min (A) (by C-G formula based on SCr of 1.85 mg/dL (H)).   Medical History: Past Medical History:  Diagnosis Date  . Anxiety   . Arthritis    right elbow  . 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 Breast cancer ([place]) 03/2010   left breast s/p lumpectomy; adjuvant radiation; on adjuvant homrnal therapy  . Chronic kidney disease    stage 3  . COPD (chronic obstructive pulmonary disease) ([place])   . Hay fever   . History of breast cancer 09/29/2012  . Hypertension   . Hypothyroidism   . Macular degeneration   . Migraine   . MVP (mitral valve prolapse)   . Osteopenia   . PONV (postoperative nausea and vomiting)   . Right arm fracture   . Sciatic pain   . Scoliosis     Medications:  Scheduled:  . aspirin EC  81 mg Oral Daily  . buPROPion  150 mg Oral Daily  . carvedilol  12.5 mg Oral BID WC  . levothyroxine  50 mcg Oral Once per day on Sun Sat   And  . levothyroxine  75 mcg Oral Once per day on Mon Tue Wed Thu Fri  . rosuvastatin  10 mg Oral Daily  . sodium  chloride flush  3 mL Intravenous Q12H   Infusions:  . sodium chloride    . sodium chloride 10 mL/hr at 09/06/19 0515  . sodium chloride 1 mL/kg/hr (09/06/19 2300)  . heparin 800 Units/hr (09/06/19 1900)  . nitroGLYCERIN 3 mcg/min (09/06/19 1900)    Assessment: 78 y.o. female presenting with palpitations and irregular heart rate. Experiences intense chest pain and lightheadedness at night. PMH significant for CHF, HTN, COPD, CKD stage 3. No anticoagulation PTA.  Pharmacy consulted for heparin dosing.  Heparin level is therapeutic at 0.31, on 800 units/hr.  Hgb 10.5, plt 189. No s/sx of bleeding.    Goal of Therapy:  Heparin level 0.3-0.7 units/ml Monitor  platelets by anticoagulation protocol: Yes   Plan:  Continue heparin gtt at 800 units/hr  Daily heparin level and CBC. F/u after cath  Antonietta Jewel, PharmD, [place] Pharmacist  Phone: [phone]  Please check AMION for all [place] phone numbers After 10:00 PM, call [place] [phone]  09/07/2019 7:26 AM   ADDENDUM Underwent cardiac cath today finding disease in LAD and non-obstructive stenosis in LCx/RCA - will medically manage. Discontinue heparin post-cath.  Antonietta Jewel, PharmD, [place] Clinical Pharmacist

## 2019-09-07 NOTE — TOC Benefit Eligibility Note (Signed)
Transition of Care [facility]) Benefit Eligibility Note    Patient Details  Name: Shelly [name] MRN: [id number] Date of Birth: Oct 30, 1941   Medication/Dose: BIDIL  20-37.5 MG BID  Covered?: Yes  Tier: 3 Drug  Prescription Coverage Preferred Pharmacy: [facility]   90 DAY SUPPLY FOR M/O$131.00  Spoke with Person/Company/Phone Number:: KYLE  @  [employer] # [phone]  Co-Pay: $ 47.00  Prior Approval: No  Deductible: Unmet       [place] Phone Number: 09/07/2019, 2:28 PM

## 2019-09-07 NOTE — Progress Notes (Addendum)
Progress Note  Patient Name: Shelly [name] Date of Encounter: 09/07/2019  Primary Cardiologist: Liam Rogers    Subjective   Denies chest pain. Kidney function improved today. Had cath today with minimal contrast. Severe diffuse disease noted in LAD.  No current chest pain.  Inpatient Medications    Scheduled Meds: . aspirin EC  81 mg Oral Daily  . buPROPion  150 mg Oral Daily  . carvedilol  12.5 mg Oral BID WC  . levothyroxine  50 mcg Oral Once per day on Sun Sat   And  . levothyroxine  75 mcg Oral Once per day on Mon Tue Wed Thu Fri  . rosuvastatin  10 mg Oral Daily  . sodium chloride flush  3 mL Intravenous Q12H   Continuous Infusions: . sodium chloride 10 mL/hr at 09/06/19 0515  . nitroGLYCERIN Stopped (09/07/19 0825)   PRN Meds: acetaminophen, ondansetron (ZOFRAN) IV   Vital Signs    Vitals:   09/06/19 2308 09/07/19 0412 09/07/19 0811 09/07/19 1034  BP: (!) 141/64 140/69  (!) 141/66  Pulse: 65 66  63  Resp: 16 11  20   Temp: 98.6 F (37 C) 97.8 F (36.6 C)  (!) 97.3 F (36.3 C)  TempSrc: Oral Oral  Oral  SpO2: 98% 100% 98% 96%  Weight:      Height:        Intake/Output Summary (Last 24 hours) at 09/07/2019 1049 Last data filed at 09/07/2019 1000 Gross per 24 hour  Intake 2027.5 ml  Output 1125 ml  Net 902.5 ml   Last 3 Weights 09/06/2019 09/05/2019 09/04/2019  Weight (lbs) 105 lb 6.1 oz 109 lb 5.6 oz 108 lb 0.4 oz  Weight (kg) 47.8 kg 49.6 kg 49 kg      Telemetry    Normal sinus rhythm- Personally Reviewed  ECG    Dramatic precordial T wave inversions with sinus rhythm noted on EKG performed on 10/06/2019.- Personally Reviewed  Physical Exam  Slender GEN: No acute distress.   Neck: No JVD Cardiac: RRR, no murmurs, rubs, or gallops.  Vascular: Right radial access site is unremarkable. Respiratory: Clear to auscultation bilaterally. GI: Soft, nontender, non-distended  MS: No edema; No deformity. Neuro:  Nonfocal  Psych: Normal affect    Labs    High Sensitivity Troponin:   Recent Labs  Lab 09/03/19 1637 09/03/19 1837  TROPONINIHS 32* 36*      Chemistry Recent Labs  Lab 09/06/19 0313 09/06/19 1522 09/07/19 0245  NA 138 140 139  K 4.2 4.7 4.8  CL 104 108 112*  CO2 24 25 19*  GLUCOSE 103* 106* 105*  BUN 45* 43* 43*  CREATININE 2.35* 2.26* 1.85*  CALCIUM 9.1 8.8* 8.6*  GFRNONAA 19* 20* 25*  GFRAA 22* 23* 29*  ANIONGAP 10 7 8      Hematology Recent Labs  Lab 09/05/19 0011 09/06/19 0313 09/07/19 0245  WBC 6.9 7.6 6.2  RBC 4.19 3.98 3.61*  HGB 12.0 11.3* 10.5*  HCT 37.9 35.8* 34.0*  MCV 90.5 89.9 94.2  MCH 28.6 28.4 29.1  MCHC 31.7 31.6 30.9  RDW 12.3 12.3 12.4  PLT 238 215 189    BNP Recent Labs  Lab 09/03/19 1637  BNP 1,032.6*     DDimer No results for input(s): DDIMER in the last 168 hours.   Radiology    No results found.  Cardiac Studies   Coronary angiography 09/07/2019: Diagnostic Dominance: Right    Patient Profile     78 y.o.  female DOE, edema and tachycardia. New onset systolic CHF with EF 12-24% by Echo. Marked ST- T wave changes on Ecg.   Cardiac cath on 09/07/2019 demonstrates severe diffuse disease throughout the entire LAD.  LAD is dominant and wraps around the left ventricular apex.  Assessment & Plan    1. NSTEACS: LV contractility pattern suggest Takotsubo and coronary angiography is demonstrated angiographically significant diffuse LAD disease.  Medical therapy is only treatment option based upon the appearance of the LAD.  If there is rapid recovery of LV systolic function, Stress cardiomyopathy is a consideration.  On the other hand however, the LV could be stunned from underlying CAD with occlusion and re-perfusion. 2. Acute systolic heart failure: Will optimize guideline directed therapy for acute systolic heart failure, likely minus RAAS inhibition due to kidney disease.  BiDil will be substituted. Consider MRA depending on kidney function. 3. Acute on chronic  kidney disease stage IV: Closely monitor kidney function post contrast exposure.  Minimal contrast was used. 4. CAD with diffuse involvement of LAD: Aggressive lipid-lowering/dual antiplatelet therapy/nitrates and beta-blocker therapy as needed for symptom control  For questions or updates, please contact [place] Please consult [email or web address] for contact info under        Signed, Sinclair Grooms, MD  09/07/2019, 10:49 AM

## 2019-09-07 NOTE — Interval H&P Note (Signed)
History and Physical Interval Note:  09/07/2019 6:35 AM  Shelly [name]  has presented today for surgery, with the diagnosis of Unstable Angina.  The various methods of treatment have been discussed with the patient and family. After consideration of risks, benefits and other options for treatment, the patient has consented to  Procedure(s): LEFT HEART CATH AND CORONARY ANGIOGRAPHY (N/A) as a surgical intervention.  The patient's history has been reviewed, patient examined, no change in status, stable for surgery.  I have reviewed the patient's chart and labs.  Questions were answered to the patient's satisfaction.     [name]

## 2019-09-08 DIAGNOSIS — I2542 Coronary artery dissection: Secondary | ICD-10-CM

## 2019-09-08 DIAGNOSIS — E7801 Familial hypercholesterolemia: Secondary | ICD-10-CM

## 2019-09-08 LAB — BASIC METABOLIC PANEL
Anion gap: 8 (ref 5–15)
BUN: 35 mg/dL — ABNORMAL HIGH (ref 8–23)
CO2: 21 mmol/L — ABNORMAL LOW (ref 22–32)
Calcium: 8.8 mg/dL — ABNORMAL LOW (ref 8.9–10.3)
Chloride: 110 mmol/L (ref 98–111)
Creatinine, Ser: 1.85 mg/dL — ABNORMAL HIGH (ref 0.44–1.00)
GFR calc Af Amer: 29 mL/min — ABNORMAL LOW (ref >60)
GFR calc non Af Amer: 25 mL/min — ABNORMAL LOW (ref >60)
Glucose, Bld: 106 mg/dL — ABNORMAL HIGH (ref 70–99)
Potassium: 4.5 mmol/L (ref 3.5–5.1)
Sodium: 139 mmol/L (ref 135–145)

## 2019-09-08 LAB — CBC
HCT: 31.1 % — ABNORMAL LOW (ref 36.0–46.0)
Hemoglobin: 10 g/dL — ABNORMAL LOW (ref 12.0–15.0)
MCH: 29.4 pg (ref 26.0–34.0)
MCHC: 32.2 g/dL (ref 30.0–36.0)
MCV: 91.5 fL (ref 80.0–100.0)
Platelets: 176 10*3/uL (ref 150–400)
RBC: 3.4 MIL/uL — ABNORMAL LOW (ref 3.87–5.11)
RDW: 12.6 % (ref 11.5–15.5)
WBC: 6.7 10*3/uL (ref 4.0–10.5)
nRBC: 0 % (ref 0.0–0.2)

## 2019-09-08 MED ORDER — HYDRALAZINE HCL 25 MG PO TABS
37.5000 mg | ORAL_TABLET | Freq: Two times a day (BID) | ORAL | Status: DC
  Administered 2019-09-09: 37.5 mg via ORAL
  Filled 2019-09-08: qty 2

## 2019-09-08 MED ORDER — ALUM & MAG HYDROXIDE-SIMETH 200-200-20 MG/5ML PO SUSP
30.0000 mL | Freq: Four times a day (QID) | ORAL | Status: DC | PRN
  Administered 2019-09-08: 30 mL via ORAL
  Filled 2019-09-08: qty 30

## 2019-09-08 MED ORDER — ISOSORBIDE MONONITRATE ER 30 MG PO TB24
30.0000 mg | ORAL_TABLET | Freq: Every day | ORAL | Status: DC
  Administered 2019-09-09: 09:00:00 30 mg via ORAL
  Filled 2019-09-08: qty 1

## 2019-09-08 NOTE — Progress Notes (Addendum)
The patient has been seen in conjunction with Reino Bellis, NP. All aspects of care have been considered and discussed. The patient has been personally interviewed, examined, and all clinical data has been reviewed.   Reviewed digital images.  Cannot exclude the possibility that the diffuse nature of disease in LAD is (SCAD-spontaneous coronary artery dissection).  Could also be a diffuse native atherosclerosis.  Symptoms on presentation were sudden making longstanding high-grade LAD obstruction seen less likely.  Plan is phase 1 cardiac rehab.  Guideline directed therapy for LV systolic dysfunction.  Antiplatelet therapy.  Statin therapy.  If ambulates without difficulty, she will be a candidate for discharge.  Will need cardiology follow-up in 7 days (TOC)  Kidney function is stable, CKD stage IV.  Progress Note  Patient Name: Shelly [name] Date of Encounter: 09/08/2019  Primary Cardiologist: Nahser  Subjective   No chest pain, but did have episodes of "gas" pain last night. Relieved with maalox.   Inpatient Medications    Scheduled Meds: . aspirin EC  81 mg Oral Daily  . buPROPion  150 mg Oral Daily  . carvedilol  12.5 mg Oral BID WC  . isosorbide-hydrALAZINE  1 tablet Oral BID  . levothyroxine  50 mcg Oral Once per day on Sun Sat   And  . levothyroxine  75 mcg Oral Once per day on Mon Tue Wed Thu Fri  . rosuvastatin  20 mg Oral q1800  . sodium chloride flush  3 mL Intravenous Q12H  . sodium chloride flush  3 mL Intravenous Q12H   Continuous Infusions: . sodium chloride 10 mL/hr at 09/06/19 0515  . sodium chloride    . nitroGLYCERIN Stopped (09/07/19 0825)   PRN Meds: sodium chloride, acetaminophen, alum & mag hydroxide-simeth, ondansetron (ZOFRAN) IV, sodium chloride flush   Vital Signs    Vitals:   09/07/19 1900 09/07/19 2340 09/08/19 0351 09/08/19 0754  BP: (!) 110/52 (!) 105/49 (!) 112/54 (!) 108/42  Pulse:  63 64   Resp: 14 16 (!) 24 16  Temp:   97.6 F (36.4 C) 98 F (36.7 C)   TempSrc:  Oral Oral   SpO2:  98% 99%   Weight:      Height:        Intake/Output Summary (Last 24 hours) at 09/08/2019 0911 Last data filed at 09/07/2019 1700 Gross per 24 hour  Intake 433.29 ml  Output 800 ml  Net -366.71 ml   Last 3 Weights 09/06/2019 09/05/2019 09/04/2019  Weight (lbs) 105 lb 6.1 oz 109 lb 5.6 oz 108 lb 0.4 oz  Weight (kg) 47.8 kg 49.6 kg 49 kg      Telemetry    SR, TWI - Personally Reviewed  ECG    No new tracing - Personally Reviewed  Physical Exam  Thin older WF. GEN: No acute distress.   Neck: No JVD Cardiac: RRR, soft systolic murmur, rubs, or gallops.  Respiratory: Clear to auscultation bilaterally. GI: Soft, nontender, non-distended  MS: No edema; No deformity. Right femoral site stable.  Neuro:  Nonfocal  Psych: Normal affect   Labs    High Sensitivity Troponin:   Recent Labs  Lab 09/03/19 1637 09/03/19 1837  TROPONINIHS 32* 36*      Chemistry Recent Labs  Lab 09/06/19 1522 09/07/19 0245 09/08/19 0234  NA 140 139 139  K 4.7 4.8 4.5  CL 108 112* 110  CO2 25 19* 21*  GLUCOSE 106* 105* 106*  BUN 43* 43* 35*  CREATININE 2.26* 1.85* 1.85*  CALCIUM 8.8* 8.6* 8.8*  GFRNONAA 20* 25* 25*  GFRAA 23* 29* 29*  ANIONGAP 7 8 8      Hematology Recent Labs  Lab 09/06/19 0313 09/07/19 0245 09/08/19 0234  WBC 7.6 6.2 6.7  RBC 3.98 3.61* 3.40*  HGB 11.3* 10.5* 10.0*  HCT 35.8* 34.0* 31.1*  MCV 89.9 94.2 91.5  MCH 28.4 29.1 29.4  MCHC 31.6 30.9 32.2  RDW 12.3 12.4 12.6  PLT 215 189 176    BNP Recent Labs  Lab 09/03/19 1637  BNP 1,032.6*     DDimer No results for input(s): DDIMER in the last 168 hours.   Radiology    No results found.  Cardiac Studies   Cath: 09/07/19  Conclusion  1. Diffusely diseased LAD with moderate proximal stenosis and severe diffuse mid/distal stenosis, tortuous vessel unfavorable for PCI 2. Mild nonobstructive stenosis of the RCA and LCx 3. Normal LVEDP  4. Known severe LV dysfunction by echo assessment  Recommend: medical therapy. Remains unclear whether she has had a coronary event or Takotsubo's Syndrome. There is not a clear 'culprit lesion' by angiography but she does have significant diffuse LAD stenosis. Regardless, medical therapy is appropriate.   Diagnostic Dominance: Right    Patient Profile     78 y.o. female DOE, edema and tachycardia. New onset systolic CHF with EF 41-93% by Echo. Marked ST- T wave changes on Ecg.  Cardiac cath on 09/07/2019 demonstrates severe diffuse disease throughout the entire LAD.  LAD is dominant and wraps around the left ventricular apex.  Assessment & Plan    1. Acute systolic HF: Underwent cardiac cath yesterday noted above with significant diffuse LAD lesion, felt to be unfavorable 2/2 to tortuosity and diffuse nature. Mild nonobstructive disease in the RCA, and Lcx. 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 Recommendations for medical therapy.  -- has been tolerating Coreg, but blood pressures soft today preventing titration. Bidil added yesterday. Copay is relatively high. Will plan to switch to Imdur/hydralizine prior to discharge. No signs of volume overload remains net negative. No room for ACE/ARB/spiro with renal dysfunction.   2. A/C CKD stage IV: Cr stable at 1.85 today.   3. HL: on Crestor 20mg  daily  4. CAD: diffuse LAD lesion (uncertain if atherosclerosis or SCAD with plans for medical therapy. On ASA 81mg . Med regimen as above.  Dispo: will have cardiac rehab see today for diet and education.  For questions or updates, please contact [place] Please consult [email or web address] for contact info under        Signed, Reino Bellis, NP  09/08/2019, 9:11 AM

## 2019-09-08 NOTE — Care Management Important Message (Signed)
Important Message  Patient Details  Name: Shelly [name] MRN: [id number] Date of Birth: May 09, 1941   Medicare Important Message Given:  Yes     Memory Argue 09/08/2019, 3:19 PM

## 2019-09-08 NOTE — Progress Notes (Signed)
CARDIAC REHAB PHASE I   PRE:  Rate/Rhythm: 65 SR  BP:  Supine:   Sitting: 103/46  Standing:    SaO2: 96%RA  MODE:  Ambulation: 340 ft   POST:  Rate/Rhythm: 80  BP:  Supine:   Sitting: 104/44  Standing:    SaO2: 97%RA 1055-1153 Pt walked 340 ft with minimal asst. Tolerated well. Glad to walk. Back to recliner with call bell. No CP. Gave pt CHF and MI booklets. Discussed signs/symptoms of CHF and when to call MD. Encouraged daily weights and 2000 mg sodium restriction. Gave ex ed and discussed CRP 2. Referred to [facility] program.  Reviewed MI restrictions and NTG use. Pt voiced understanding of ed.  Pt is interested in participating in Virtual Cardiac and Pulmonary Rehab. Pt advised that Virtual Cardiac and Pulmonary Rehab is provided at no cost to the patient.  Checklist:  1. Pt has smart device  ie smartphone and/or ipad for downloading an app  Yes 2. Reliable internet/wifi service    Yes 3. Understands how to use their smartphone and navigate within an app.  Yes   Pt verbalized understanding and is in agreement.    Graylon Good, RN BSN  09/08/2019 11:45 AM

## 2019-09-08 NOTE — Progress Notes (Signed)
NCM spoke with patient and informed her of the co pay amt for the bidil.  She states she has spoken with the pharmacist as well.

## 2019-09-09 LAB — BASIC METABOLIC PANEL
Anion gap: 10 (ref 5–15)
BUN: 37 mg/dL — ABNORMAL HIGH (ref 8–23)
CO2: 19 mmol/L — ABNORMAL LOW (ref 22–32)
Calcium: 8.9 mg/dL (ref 8.9–10.3)
Chloride: 107 mmol/L (ref 98–111)
Creatinine, Ser: 2.23 mg/dL — ABNORMAL HIGH (ref 0.44–1.00)
GFR calc Af Amer: 23 mL/min — ABNORMAL LOW (ref >60)
GFR calc non Af Amer: 20 mL/min — ABNORMAL LOW (ref >60)
Glucose, Bld: 109 mg/dL — ABNORMAL HIGH (ref 70–99)
Potassium: 4.7 mmol/L (ref 3.5–5.1)
Sodium: 136 mmol/L (ref 135–145)

## 2019-09-09 MED ORDER — ISOSORBIDE MONONITRATE ER 30 MG PO TB24: 30.0000 mg | ORAL_TABLET | Freq: Every day | ORAL | 0 refills | Status: DC

## 2019-09-09 MED ORDER — ROSUVASTATIN CALCIUM 20 MG PO TABS: 20.0000 mg | ORAL_TABLET | Freq: Every day | ORAL | 0 refills | Status: DC

## 2019-09-09 MED ORDER — HYDRALAZINE HCL 25 MG PO TABS: 37.5000 mg | ORAL_TABLET | Freq: Two times a day (BID) | ORAL | 0 refills | Status: DC

## 2019-09-09 MED ORDER — CARVEDILOL 12.5 MG PO TABS: 12.5000 mg | ORAL_TABLET | Freq: Two times a day (BID) | ORAL | 0 refills | Status: DC

## 2019-09-09 MED FILL — hydrALAZINE HCL 25 MG TABS: 25 | 30 days supply | Qty: 90 | Fill #0

## 2019-09-09 MED FILL — ISOSORBIDE MN ER 30 MG TAB: 30 | 30 days supply | Qty: 30 | Fill #0

## 2019-09-09 MED FILL — ROSUVASTATIN CALCIUM 20 MG: 20 | 30 days supply | Qty: 30 | Fill #0

## 2019-09-09 MED FILL — CARVEDILOL 12.5 MG TABLET: 12.5 | 30 days supply | Qty: 60 | Fill #0

## 2019-09-09 NOTE — Progress Notes (Signed)
   She ambulated well yesterday.  No recurrence of vague discomfort that she was having yesterday.  Stage IV chronic kidney disease, now stable compared with 08/17/2019 this is probably her new baseline.  Needs follow-up with Dr. Acie Fredrickson, with TOC in 7 to 10 days.

## 2019-09-09 NOTE — Discharge Summary (Addendum)
The patient has been seen in conjunction with Shelly Bellis, NP. All aspects of care have been considered and discussed. The patient has been personally interviewed, examined, and all clinical data has been reviewed.   Plan is for discharge.  Please see note from earlier this morning.  Discharge Summary    Patient ID: Shelly [name],  MRN: [id number], DOB/AGE: [birth year] 78 y.o.  Admit date: 09/03/2019 Discharge date: 09/09/2019  Primary Care Provider: Shon Baton Primary Cardiologist: Mertie Moores, MD  Discharge Diagnoses    Active Problems:   Unstable angina [facility])   Acute on chronic combined systolic and diastolic CHF (congestive heart failure) (HCC)   Allergies Allergies  Allergen Reactions   Morphine And Related Nausea And Vomiting   Yellow Dyes (Non-Tartrazine) Itching, Rash and Other (See Comments)    SEVERE RASH THAT COVERED THE BACK (lasted 3 months)   Amoxicillin Diarrhea and Other (See Comments)    Fever spiked at close to 105 F Has patient had a PCN reaction causing immediate rash, facial/tongue/throat swelling, SOB or lightheadedness with hypotension: Yes Has patient had a PCN reaction causing severe rash involving mucus membranes or skin necrosis: Yes Has patient had a PCN reaction that required hospitalization Yes Has patient had a PCN reaction occurring within the last 10 years: No If all of the above answers are "NO", then may proceed with Cephalosporin use.     Eggs Or Egg-Derived Products Swelling and Other (See Comments)    Eyes, lips, and mouth became swollen- no shortness of breath, however   Penicillins Diarrhea and Other (See Comments)    Febrile and diarrhea Has patient had a PCN reaction causing immediate rash, facial/tongue/throat swelling, SOB or lightheadedness with hypotension: Yes Has patient had a PCN reaction causing severe rash involving mucus membranes or skin necrosis: Yes Has patient had a PCN reaction that required  hospitalization Yes Has patient had a PCN reaction occurring within the last 10 years: No If all of the above answers are "NO", then may proceed with Cephalosporin use.    Pollen Extract Other (See Comments)    Itchy eyes, runny nose and congestion   Lamictal [Lamotrigine] Rash    Diagnostic Studies/Procedures    Cath: 09/07/19  1. Diffusely diseased LAD with moderate proximal stenosis and severe diffuse mid/distal stenosis, tortuous vessel unfavorable for PCI 2. Mild nonobstructive stenosis of the RCA and LCx 3. Normal LVEDP 4. Known severe LV dysfunction by echo assessment  Recommend: medical therapy. Remains unclear whether she has had a coronary event or Takotsubo's Syndrome. There is not a clear 'culprit lesion' by angiography but she does have significant diffuse LAD stenosis. Regardless, medical therapy is appropriate.  Diagnostic Dominance: Right   _____________   History of Present Illness     Shelly [name] is a 78 y.o. female with a hx of CHF. She was seen in the [facility] ER 9/10 and was scheduled for an appt with the DOD the following day. Echo on Sept. 4 revealed EF of 30-35%. Grade 1 diastolic dysfunction. Moderate pulmonary HTN with PA pressure of 56 mmHg. Mild - mod MR  We were asked to see her by Dr. Virgina Jock for further evaluation of sudden onset of rapid HR. [place] office visit. Woke up , could not breath felt weak. Called EMS. EMS did an ECG but HR had slowed by that time . Came back to , and went to urgent care. ECG  showed marked TWI. Felt ok, therefore was not admitted.  Echo was ordered and showed EF of 30-35%. , moderate hypokinesis of anterior wall, PA pressure of 56 mmHg. She was started on Coreg 3. 125 BID  Reporting having had some tightness in her chest for most of the summer. Had been fatigued. Given her symptoms and clinical findings she was sent to the ED for admission.   Hospital Course     1. Acute  systolic HF: Underwent cardiac cath 09/07/19 noted above with significant diffuse LAD lesion, felt to be unfavorable 2/2 to tortuosity and diffuse nature. Mild nonobstructive disease in the RCA, and Lcx. 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 Recommendations for medical therapy.  -- tolerated Coreg which was titrated to 12.5mg  BID. Bidil added but copay was relatively high. Therefore was switched to Imdur/hydralizine prior to discharge. No signs of volume overload and remained net negative. No room for ACE/ARB/spiro with renal dysfunction.   2. A/C CKD stage IV: She was hydrated prior to cath. Cr between 1.8>2. Cr 2.3 on the day of discharge which appeared to be her new baseline. BMET at follow up appt.   3. HL: on Crestor 20mg  daily. LDL 75  4. CAD: diffuse LAD lesion (uncertain if atherosclerosis or SCAD with plans for medical therapy. On ASA 81mg , statin, BB  General: Thin older W female appearing in no acute distress. Head: Normocephalic, atraumatic.  Neck: Supple without bruits, no JVD. Lungs:  Resp regular and unlabored, CTA. Heart: RRR, S1, S2, + systolic murmur; no rub. Abdomen: Soft, non-tender, non-distended with normoactive bowel sounds. No hepatomegaly. No rebound/guarding. No obvious abdominal masses. Extremities: No clubbing, cyanosis, edema. Distal pedal pulses are 2+ bilaterally. Right femoral cath site stable without bruising or hematoma Neuro: Alert and oriented X 3. Moves all extremities spontaneously. Psych: Normal affect.  Harle Battiest was seen by Dr. Tamala Julian and determined stable for discharge home. Follow up in the office has been arranged. Medications are listed below.   _____________  Discharge Vitals Blood pressure (!) 145/67, pulse 73, temperature 98.5 F (36.9 C), temperature source Oral, resp. rate 15, height 5\' 1"  (1.549 m), weight 47.8 kg, SpO2 98 %.  Filed Weights   09/04/19 0000 09/05/19 0400 09/06/19 0505  Weight: 49 kg 49.6 kg 47.8 kg     Labs & Radiologic Studies    CBC Recent Labs    09/07/19 0245 09/08/19 0234  WBC 6.2 6.7  HGB 10.5* 10.0*  HCT 34.0* 31.1*  MCV 94.2 91.5  PLT 189 595   Basic Metabolic Panel Recent Labs    09/08/19 0234 09/09/19 0240  NA 139 136  K 4.5 4.7  CL 110 107  CO2 21* 19*  GLUCOSE 106* 109*  BUN 35* 37*  CREATININE 1.85* 2.23*  CALCIUM 8.8* 8.9   Liver Function Tests No results for input(s): AST, ALT, ALKPHOS, BILITOT, PROT, ALBUMIN in the last 72 hours. No results for input(s): LIPASE, AMYLASE in the last 72 hours. Cardiac Enzymes No results for input(s): CKTOTAL, CKMB, CKMBINDEX, TROPONINI in the last 72 hours. BNP Invalid input(s): POCBNP D-Dimer No results for input(s): DDIMER in the last 72 hours. Hemoglobin A1C No results for input(s): HGBA1C in the last 72 hours. Fasting Lipid Panel No results for input(s): CHOL, HDL, LDLCALC, TRIG, CHOLHDL, LDLDIRECT in the last 72 hours. Thyroid Function Tests No results for input(s): TSH, T4TOTAL, T3FREE, THYROIDAB in the last 72 hours.  Invalid input(s): FREET3 _____________  Dg Chest 2 View  Result  Date: 09/03/2019 CLINICAL DATA:  Acute chest pain. EXAM: CHEST - 2 VIEW COMPARISON:  09/02/2019 and prior radiographs FINDINGS: UPPER limits normal heart size again noted. There is no evidence of focal airspace disease, pulmonary edema, suspicious pulmonary nodule/mass, pleural effusion, or pneumothorax. LEFT breast surgical changes again noted. No acute bony abnormalities are identified. IMPRESSION: No active cardiopulmonary disease. Electronically Signed   By: Margarette Canada M.D.   On: 09/03/2019 18:24   Dg Chest Portable 1 View  Result Date: 09/02/2019 CLINICAL DATA:  Cough. EXAM: PORTABLE CHEST 1 VIEW COMPARISON:  04/09/2010 FINDINGS: Prominent but normal range heart size for technique. Negative aortic contours. Interstitial coarsening compared to prior with a few Kerley lines suggested on the right. No pleural effusion or air  bronchogram. Left lumpectomy changes. IMPRESSION: Mild interstitial coarsening compared to prior which could be congestive or bronchitic. Electronically Signed   By: Monte Fantasia M.D.   On: 09/02/2019 04:21   Disposition   Pt is being discharged home today in good condition.  Follow-up Plans & Appointments    Follow-up Information    Daune Perch, NP Follow up on 09/17/2019.   Specialty: Cardiology Why: at 10:45am for your follow up appt.  Contact information: [address] [address] [phone]          Discharge Instructions    Amb Referral to Cardiac Rehabilitation   Complete by: As directed    Diagnosis: NSTEMI   After initial evaluation and assessments completed: Virtual Based Care may be provided alone or in conjunction with Phase 2 Cardiac Rehab based on patient barriers.: Yes   Call MD for:  redness, tenderness, or signs of infection (pain, swelling, redness, odor or green/yellow discharge around incision site)   Complete by: As directed    Diet - low sodium heart healthy   Complete by: As directed    Discharge instructions   Complete by: As directed    Groin Site Care Refer to this sheet in the next few weeks. These instructions provide you with information on caring for yourself after your procedure. Your caregiver may also give you more specific instructions. Your treatment has been planned according to current medical practices, but problems sometimes occur. Call your caregiver if you have any problems or questions after your procedure. HOME CARE INSTRUCTIONS You may shower 24 hours after the procedure. Remove the bandage (dressing) and gently wash the site with plain soap and water. Gently pat the site dry.  Do not apply powder or lotion to the site.  Do not sit in a bathtub, swimming pool, or whirlpool for 5 to 7 days.  No bending, squatting, or lifting anything over 10 pounds (4.5 kg) as directed by your caregiver.  Inspect the site  at least twice daily.  Do not drive home if you are discharged the same day of the procedure. Have someone else drive you.  You may drive 24 hours after the procedure unless otherwise instructed by your caregiver.  What to expect: Any bruising will usually fade within 1 to 2 weeks.  Blood that collects in the tissue (hematoma) may be painful to the touch. It should usually decrease in size and tenderness within 1 to 2 weeks.  SEEK IMMEDIATE MEDICAL CARE IF: You have unusual pain at the groin site or down the affected leg.  You have redness, warmth, swelling, or pain at the groin site.  You have drainage (other than a small amount of blood on the dressing).  You have chills.  You have a fever or persistent symptoms for more than 72 hours.  You have a fever and your symptoms suddenly get worse.  Your leg becomes pale, cool, tingly, or numb.  You have heavy bleeding from the site. Hold pressure on the site.  For patients with congestive heart failure, we give them these special instructions:  1. Follow a low-salt diet and watch your fluid intake. In general, you should not be taking in more than 2 liters of fluid per day (no more than 8 glasses per day). Some patients are restricted to less than 1.5 liters of fluid per day (no more than 6 glasses per day). This includes sources of water in foods like soup, coffee, tea, milk, etc. 2. Weigh yourself on the same scale at same time of day and keep a log. 3. Call your doctor: (Anytime you feel any of the following symptoms)  - 3-4 pound weight gain in 1-2 days or 2 pounds overnight  - Shortness of breath, with or without a dry hacking cough  - Swelling in the hands, feet or stomach  - If you have to sleep on extra pillows at night in order to breathe   IT IS IMPORTANT TO LET YOUR DOCTOR KNOW EARLY ON IF YOU ARE HAVING SYMPTOMS SO WE CAN HELP YOU!   Increase activity slowly   Complete by: As directed       Discharge Medications      Medication List    STOP taking these medications   furosemide 20 MG tablet Commonly known as: LASIX     TAKE these medications   aspirin EC 81 MG tablet Take 81 mg by mouth daily.   Biotin 1 MG Caps Take 1 mg by mouth daily.   buPROPion 150 MG 24 hr tablet Commonly known as: WELLBUTRIN XL Take 150 mg by mouth every morning.   carvedilol 12.5 MG tablet Commonly known as: COREG Take 1 tablet (12.5 mg total) by mouth 2 (two) times daily with a meal. What changed:   medication strength  how much to take   cetirizine 10 MG tablet Commonly known as: ZYRTEC Take 10 mg by mouth daily.   hydrALAZINE 25 MG tablet Commonly known as: APRESOLINE Take 1.5 tablets (37.5 mg total) by mouth 2 (two) times daily.   isosorbide mononitrate 30 MG 24 hr tablet Commonly known as: IMDUR Take 1 tablet (30 mg total) by mouth daily.   levothyroxine 75 MCG tablet Commonly known as: SYNTHROID Take 50-75 mcg by mouth See admin instructions. Take 50 mcg by mouth in the morning before breakfast on Sun/Sat and 75 mcg on Mon/Tues/Wed/Thurs/Fri   PreserVision/Lutein Caps Take 1 capsule by mouth every morning.   rosuvastatin 20 MG tablet Commonly known as: CRESTOR Take 1 tablet (20 mg total) by mouth daily at 6 PM. What changed:   medication strength  how much to take  when to take this   Vitamin D-3 25 MCG (1000 UT) Caps Take 1,000 Units by mouth daily.       Acute coronary syndrome (MI, NSTEMI, STEMI, etc) this admission?:  No.  The elevated Troponin was due to the acute medical illness or demand ischemia.     Outstanding Labs/Studies   BMET at follow up appt.   Duration of Discharge Encounter   Greater than 30 minutes including physician time.  Signed, Shelly Bellis NP-C 09/09/2019, 11:14 AM

## 2019-09-09 NOTE — Progress Notes (Signed)
CARDIAC REHAB PHASE I   PRE:  Rate/Rhythm: 72 SR  BP:  Supine:   Sitting: 114/52  Standing:    SaO2: 96%RA  MODE:  Ambulation: 400 ft   POST:  Rate/Rhythm: 82 SR  BP:  Supine:   Sitting: 132/60  Standing:    SaO2: 97%RA 1005-1025 Pt walked 400 ft on RA with hand held asst and tolerated well.  Very excited about CRP 2 and wants to attend.   Graylon Good, RN BSN  09/09/2019 10:20 AM

## 2019-09-09 NOTE — Plan of Care (Signed)
Progressing

## 2019-09-10 ENCOUNTER — Telehealth (HOSPITAL_COMMUNITY): Payer: Self-pay

## 2019-09-10 NOTE — Telephone Encounter (Signed)
Attempted to call patient in regards to Cardiac Rehab - LM on VM 

## 2019-09-10 NOTE — Telephone Encounter (Signed)
Pt returned CR phone call, patient expressed interest. Explained scheduling process and went over insurance, patient verbalized understanding. Will contact patient for scheduling once f/u has been completed. 

## 2019-09-10 NOTE — Telephone Encounter (Signed)
Pt insurance is active and benefits verified through [facility]. Co-pay $20.00, DED $0.00/$0.00 met, out of pocket $3,600.00/$287.99 met, co-insurance 0%. No pre-authorization required. Passport, 09/10/2019 @ 207PM, DXA#[id number]  Will contact patient to see if she is interested in the Cardiac Rehab Program. If interested, patient will need to complete follow up appt. Once completed, patient will be contacted for scheduling upon review by the RN Navigator.

## 2019-09-15 ENCOUNTER — Telehealth: Payer: Self-pay | Admitting: Cardiovascular Disease

## 2019-09-15 NOTE — Telephone Encounter (Signed)
Called patient back about her message. Patient stated she thinks she had an anxiety attack last night. Patient stated around 4:00 am this morning she got up to go to the bathroom and felt weak with her heart racing. Patient stated her BP was 176/97 and HR 97, so she called 911. EMS came out and evaluated patient. Patient stated EMS said everything look normal on EKG and her BP and HR had come down. Patient decided it must have been a panic attack, but called the office this morning to let Dr. Acie Fredrickson know. Patient also complained about back pain that radiates down her right leg. Patient stated she has some issues with her back. Patient also has history of right leg peripheral artery disease, Dr. Oneida Alar is her vascular doctor and is due for follow up next month. Patient had recent heart cath and has f/u this Friday with Pecolia Ades NP. Encouraged patient to keep her appt on Friday. Informed patient to call the office if her BP and HR continue to be elevated. Patient stated she was feeling fine now. Encouraged patient to call her PCP about Anxiety and issues with right leg pain/back pain. Encouraged patient to call her vascular doctor and make her follow up appointment. Patient verbalized understanding. Will route to Dr. Acie Fredrickson for any further advisement.

## 2019-09-15 NOTE — Telephone Encounter (Signed)
New Message  Patient c/o Palpitations:  High priority if patient c/o lightheadedness, shortness of breath, or chest pain  1) How long have you had palpitations/irregular HR/ Afib? Are you having the symptoms now? Last night, not having any symptoms now  2) Are you currently experiencing lightheadedness, SOB or CP? No  3) Do you have a history of afib (atrial fibrillation) or irregular heart rhythm? Yes  4) Have you checked your BP or HR? (document readings if available): N/A  5) Are you experiencing any other symptoms? Had the rapid/irregular heartbeat and patient's husband immediately called 911 and EMS came out and done an assessment.

## 2019-09-15 NOTE — Telephone Encounter (Signed)
She should keep her appt with Pecolia Ades this Friday  No additional recs

## 2019-09-17 ENCOUNTER — Ambulatory Visit (INDEPENDENT_AMBULATORY_CARE_PROVIDER_SITE_OTHER): Payer: Medicare Other | Admitting: Cardiology

## 2019-09-17 ENCOUNTER — Other Ambulatory Visit: Payer: Self-pay

## 2019-09-17 ENCOUNTER — Encounter: Payer: Self-pay | Admitting: Cardiology

## 2019-09-17 VITALS — BP 126/58 | HR 74 | Ht 61.0 in | Wt 110.0 lb

## 2019-09-17 DIAGNOSIS — E039 Hypothyroidism, unspecified: Secondary | ICD-10-CM

## 2019-09-17 DIAGNOSIS — I2510 Atherosclerotic heart disease of native coronary artery without angina pectoris: Secondary | ICD-10-CM

## 2019-09-17 DIAGNOSIS — N179 Acute kidney failure, unspecified: Secondary | ICD-10-CM | POA: Diagnosis not present

## 2019-09-17 DIAGNOSIS — I5181 Takotsubo syndrome: Secondary | ICD-10-CM | POA: Diagnosis not present

## 2019-09-17 DIAGNOSIS — I5043 Acute on chronic combined systolic (congestive) and diastolic (congestive) heart failure: Secondary | ICD-10-CM | POA: Diagnosis not present

## 2019-09-17 DIAGNOSIS — N189 Chronic kidney disease, unspecified: Secondary | ICD-10-CM

## 2019-09-17 DIAGNOSIS — I10 Essential (primary) hypertension: Secondary | ICD-10-CM

## 2019-09-17 DIAGNOSIS — E785 Hyperlipidemia, unspecified: Secondary | ICD-10-CM

## 2019-09-17 LAB — BASIC METABOLIC PANEL
BUN/Creatinine Ratio: 21 (ref 12–28)
BUN: 41 mg/dL — ABNORMAL HIGH (ref 8–27)
CO2: 20 mmol/L (ref 20–29)
Calcium: 9.3 mg/dL (ref 8.7–10.3)
Chloride: 107 mmol/L — ABNORMAL HIGH (ref 96–106)
Creatinine, Ser: 1.98 mg/dL — ABNORMAL HIGH (ref 0.57–1.00)
GFR calc Af Amer: 27 mL/min/{1.73_m2} — ABNORMAL LOW (ref >59)
GFR calc non Af Amer: 23 mL/min/{1.73_m2} — ABNORMAL LOW (ref >59)
Glucose: 96 mg/dL (ref 65–99)
Potassium: 5 mmol/L (ref 3.5–5.2)
Sodium: 141 mmol/L (ref 134–144)

## 2019-09-17 MED ORDER — ISOSORBIDE MONONITRATE ER 30 MG PO TB24: 30.0000 mg | ORAL_TABLET | Freq: Every day | ORAL | 3 refills | Status: DC

## 2019-09-17 MED ORDER — HYDRALAZINE HCL 25 MG PO TABS: 37.5000 mg | ORAL_TABLET | Freq: Two times a day (BID) | ORAL | 3 refills | Status: DC

## 2019-09-17 MED ORDER — CARVEDILOL 12.5 MG PO TABS: 12.5000 mg | ORAL_TABLET | Freq: Two times a day (BID) | ORAL | 3 refills | Status: DC

## 2019-09-17 MED ORDER — ROSUVASTATIN CALCIUM 20 MG PO TABS: 20.0000 mg | ORAL_TABLET | Freq: Every day | ORAL | 3 refills | Status: DC

## 2019-09-17 NOTE — Patient Instructions (Addendum)
Medication Instructions:  Your physician recommends that you continue on your current medications as directed. Please refer to the Current Medication list given to you today.  If you need a refill on your cardiac medications before your next appointment, please call your pharmacy.   Lab work: TODAY: BMET  If you have labs (blood work) drawn today and your tests are completely normal, you will receive your results only by: Marland Kitchen MyChart Message (if you have MyChart) OR . A paper copy in the mail If you have any lab test that is abnormal or we need to change your treatment, we will call you to review the results.  Testing/Procedures: You have been referred to see a dietician (Someone will contact with you with an appointment)  Follow-Up: At [facility], you and your health needs are our priority.  As part of our continuing mission to provide you with exceptional heart care, we have created designated Provider Care Teams.  These Care Teams include your primary Cardiologist (physician) and Advanced Practice Providers (APPs -  Physician Assistants and Nurse Practitioners) who all work together to provide you with the care you need, when you need it. You will need a follow up appointment in:  3 months.  Please call our office 2 months in advance to schedule this appointment.  You may see Mertie Moores, MD or one of the following Advanced Practice Providers on your designated Care Team: Richardson Dopp, PA-C [place], [place] . Daune Perch, NP  Any Other Special Instructions Will Be Listed Below (If Applicable).   Lifestyle Modifications to Prevent and Treat Heart Disease -Recommend heart healthy/Mediterranean diet, with whole grains, fruits, vegetables, fish, lean meats, nuts, olive oil and avocado oil.  -Limit salt intake to less than 2000 mg per day.  -Recommend moderate walking, starting slowly with a few minutes and working up to 3-5 times/week for 30-50 minutes each session. Aim for at  least 150 minutes.week. Goal should be pace of 3 miles/hours, or walking 1.5 miles in 30 minutes -Recommend avoidance of tobacco products. Avoid excess alcohol. -Keep blood pressure well controlled, ideally less than 130/80.  ===================================  One of your heart tests showed weakness of the heart muscle. This may make you more susceptible to weight gain from fluid retention, which can lead to symptoms that we call heart failure. Please follow these special instructions:   1. Follow a low-salt diet - you are allowed no more than 2,000mg  of sodium per day. Watch your fluid intake. In general, you should not be taking in more than 2 liters of fluid per day (no more than 8 glasses per day). This includes sources of water in foods like soup, coffee, tea, milk, etc. 2. Weigh yourself on the same scale at same time of day and keep a log. 3. Call your doctor: (Anytime you feel any of the following symptoms)  - 3lb weight gain overnight or 5lb within a few days - Shortness of breath, with or without a dry hacking cough  - Swelling in the hands, feet or stomach  - If you have to sleep on extra pillows at night in order to breathe   IT IS IMPORTANT TO LET YOUR DOCTOR KNOW EARLY ON IF YOU ARE HAVING SYMPTOMS SO WE CAN HELP YOU!

## 2019-09-17 NOTE — Progress Notes (Signed)
Cardiology Office Note:    Date:  09/17/2019   ID:  Shelly [name], DOB June 06, 1941, MRN [id number]  PCP:  Shon Baton, MD  Cardiologist:  Mertie Moores, MD  Referring MD: Shon Baton, MD   Chief Complaint  Patient presents with   Follow-up   Congestive Heart Failure   Coronary Artery Disease   Hypertension   Chronic Kidney Disease    History of Present Illness:    Shelly [name] is a 78 y.o. female with a past medical history significant for hypertension, hypothyroidism, COPD, CKD stage III, bipolar depression and history of mitral valve prolapse.  The patient was seen at urgent care on 08/17/2019 for complaints of episodes of fast heartbeat.  EKG showed T wave inversions in the inferior lateral leads that were new.  The Patient was completely asymptomatic at the time.  The patient was advised to follow-up with PCP and consider need for possible cardiology evaluation.  An echocardiogram was done on 08/27/2019 which revealed EF of 30-35% with LV global hypokinesis and no regional wall motion abnormalities, diastolic dysfunction.  The patient was again seen in the [facility], ED on 09/02/2019 with complaints of palpitations, occurring more frequently, every day.  Coreg was initiated and patient was referred to our office for further evaluation the next day.  The patient was seen by Dr. Acie Fredrickson in the office on 09/03/2019.  He noted that the [place], woke up with difficulty breathing and felt weak.  She called EMS.  EKG showed that her heart rate had slowed by that time.  She noted that she had some tightness in her chest for most of the summer and had really not felt well.  The patient was sent to the hospital for further evaluation and treatment.  She underwent cardiac cath on 09/07/2019 that revealed significant diffuse LAD lesion, felt to be unfavorable to intervention due to tortuosity and diffuse nature.  Mild nonobstructive disease in the RCA and  left circumflex were also noted.  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  Coreg was titrated up to 12.5 mg twice daily.  BiDil was added but co-pay was relatively high.  Therefore she was switched to Imdur/hydralazine prior to discharge.  She had no signs of volume overload and remained net negative fluid balance.  There was no room for ACE/ARB/Spiro due to renal dysfunction.  Serum creatinine was 2.3 on the day of discharge which appeared to be her new baseline.  The patient is on Crestor with LDL of 75.  She is also on aspirin and beta-blocker.   Shelly [name] is here today for hospital follow up. She is quite anxious. She notes that September 15th was the anniversary of a prior severe bout of depression from 1986. She feels that the anxiety related to this may have contributed to this episode. She has also been treated for bipolar disorder for over 20 years.   Today she says that each day since the hospital she has felt slightly stronger. She has been doing simple house chores, nothing strenuous. She is walking up and down in her house. Is trying to add a minute every few days. She plans to attend cardiac rehab.  No orthopnea, PND, or chest pain. She is having occ lightheadedness, mostly after she takes her evening dose of carvedilol and hydralazine together.  I advised her to split these 2 medications apart by couple of hours.  She  started seeing a nephrologist in May 2020. Olmesartan had raised her potssium. She has appt on 10/2.   Home BPs 120's-130's/50's-60's. Occ up to 140's.   Has PAD right leg followed by Dr. Oneida Alar. Chronic edema of left lower leg, now only very mild.   She notes that a few days ago she accidentally took an extra Imdur instead of her evening dose of hydralazine.  She panicked and called 911.  She had no adverse effects.  I reassured her that this was not dangerous and she says she knows now as the EMS personnel told her the  same thing. The patient had a health navigator come to her home and help her organize her pills into a [employer].   Cardiac studies   Cath: 09/07/19  1. Diffusely diseased LAD with moderate proximal stenosis and severe diffuse mid/distal stenosis, tortuous vessel unfavorable for PCI 2. Mild nonobstructive stenosis of the RCA and LCx 3. Normal LVEDP 4. Known severe LV dysfunction by echo assessment  Recommend: medical therapy. Remains unclear whether she has had a coronary event or Takotsubo's Syndrome. There is not a clear 'culprit lesion' by angiography but she does have significant diffuse LAD stenosis. Regardless, medical therapy is appropriate.  Diagnostic Dominance: Right   Echocardiogram 08/27/2019 IMPRESSIONS   1. Moderate hypokinesis of the left ventricular anteroseptal wall, anterior wall and apical segment.  2. The left ventricle has moderate-severely reduced systolic function, with an ejection fraction of 30-35%. The cavity size was normal. Left ventricular diastolic Doppler parameters are consistent with impaired relaxation. Left ventrical global  hypokinesis without regional wall motion abnormalities.  3. The right ventricle has normal systolic function. The cavity was normal. There is no increase in right ventricular wall thickness. Right ventricular systolic pressure is moderately elevated with an estimated pressure of 56.1 mmHg.  4. Left atrial size was moderately dilated.  5. Right atrial size was moderately dilated.  6. There is mild mitral annular calcification present. Mitral valve regurgitation is mild to moderate by color flow Doppler. The MR jet is centrally-directed.  7. Tricuspid valve regurgitation is moderate.  8. The aorta is normal unless otherwise noted.  9. The inferior vena cava was dilated in size with <50% respiratory variability. 10. There is right bowing of the interatrial septum, suggestive of elevated left atrial pressure.  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   Breast cancer ([place]) 03/2010   left breast s/p lumpectomy; adjuvant radiation; on adjuvant homrnal therapy   Chronic kidney disease    stage 3   COPD (chronic obstructive pulmonary disease) (HCC)    Hay fever    History of breast cancer 09/29/2012   Hypertension    Hypothyroidism    Macular degeneration    Migraine    MVP (mitral valve prolapse)    Osteopenia    PONV (postoperative nausea and vomiting)    Right arm fracture    Sciatic pain    Scoliosis     Past Surgical History:  Procedure Laterality Date   APPENDECTOMY     BREAST BIOPSY     BREAST LUMPECTOMY     CATARACT EXTRACTION Right    COLONOSCOPY     DILATATION & CURETTAGE/HYSTEROSCOPY WITH MYOSURE N/A 05/24/2016   Procedure: Delta;  Surgeon: Azucena Fallen, MD;  Location: [facility];  Service: Gynecology;  Laterality: N/A;  400  cc deficit   DILATION AND CURETTAGE OF UTERUS     uterine polyps   EYE SURGERY Right    retina   HERNIA REPAIR     LAPAROTOMY     LEFT HEART CATH AND CORONARY ANGIOGRAPHY N/A 09/07/2019   Procedure: LEFT HEART CATH AND CORONARY ANGIOGRAPHY;  Surgeon: [name], MD;  Location: [facility];  Service: Cardiovascular;  Laterality: N/A;   right arm surgery     due to fracture   TONSILLECTOMY     TUBAL LIGATION     UNILATERAL SALPINGECTOMY Left    UPPER GI ENDOSCOPY     WISDOM TOOTH EXTRACTION      Current Medications: Current Meds  Medication Sig   aspirin EC 81 MG tablet Take 81 mg by mouth daily.   Biotin 1 MG CAPS Take 1 mg by mouth daily.    buPROPion (WELLBUTRIN XL) 150 MG 24 hr tablet Take 150 mg by mouth every morning.    carvedilol (COREG) 12.5 MG tablet Take 1 tablet (12.5 mg total) by mouth 2 (two) times daily with a meal.   cetirizine (ZYRTEC) 10 MG tablet Take 10 mg by  mouth daily.   Cholecalciferol (VITAMIN D-3) 25 MCG (1000 UT) CAPS Take 1,000 Units by mouth daily.   hydrALAZINE (APRESOLINE) 25 MG tablet Take 1.5 tablets (37.5 mg total) by mouth 2 (two) times daily.   isosorbide mononitrate (IMDUR) 30 MG 24 hr tablet Take 1 tablet (30 mg total) by mouth daily.   Multiple Vitamins-Minerals (PRESERVISION/LUTEIN) CAPS Take 1 capsule by mouth every morning.    rosuvastatin (CRESTOR) 20 MG tablet Take 1 tablet (20 mg total) by mouth daily at 6 PM.   SYNTHROID 50 MCG tablet Take 50 mcg by mouth daily before breakfast. Take one tablet (50 mcg)  by mouth Saturday and Sunday before breakfast. Take 1.5 tablets (75 mcg) M-F.   [DISCONTINUED] carvedilol (COREG) 12.5 MG tablet Take 1 tablet (12.5 mg total) by mouth 2 (two) times daily with a meal.   [DISCONTINUED] hydrALAZINE (APRESOLINE) 25 MG tablet Take 1.5 tablets (37.5 mg total) by mouth 2 (two) times daily.   [DISCONTINUED] isosorbide mononitrate (IMDUR) 30 MG 24 hr tablet Take 1 tablet (30 mg total) by mouth daily.   [DISCONTINUED] levothyroxine (SYNTHROID, LEVOTHROID) 75 MCG tablet Take 50-75 mcg by mouth See admin instructions. Take 50 mcg by mouth in the morning before breakfast on Sun/Sat and 75 mcg on Mon/Tues/Wed/Thurs/Fri   [DISCONTINUED] rosuvastatin (CRESTOR) 20 MG tablet Take 1 tablet (20 mg total) by mouth daily at 6 PM.     Allergies:   Morphine and related, Yellow dyes (non-tartrazine), Amoxicillin, Eggs or egg-derived products, Penicillins, Pollen extract, and Lamictal [lamotrigine]   Social History   Socioeconomic History   Marital status: Married    Spouse name: Not on file   Number of children: Not on file   Years of education: Not on file   Highest education level: Not on file  Occupational History   Not on file  Social Needs   Financial resource strain: Not hard at all   Food insecurity    Worry: Never true    Inability: Never true   Transportation needs    Medical:  No    Non-medical: No  Tobacco Use   Smoking status: Former Smoker    Packs/day: 0.50    Years: 25.00    Pack years: 12.50    Quit date: 08/07/1985    Years since quitting: 40.1  Smokeless tobacco: Never Used  Substance and Sexual Activity   Alcohol use: No    Alcohol/week: 1.0 standard drinks    Types: 1 Glasses of wine per week   Drug use: No   Sexual activity: Yes    Birth control/protection: Post-menopausal  Lifestyle   Physical activity    Days per week: 5 days    Minutes per session: 30 min   Stress: Only a little  Relationships   Social connections    Talks on phone: More than three times a week    Gets together: Three times a week    Attends religious service: More than 4 times per year    Active member of club or organization: No    Attends meetings of clubs or organizations: Never    Relationship status: Married  Other Topics Concern   Not on file  Social History Narrative   Not on file     Family History: The patient's family history is not on file. ROS:   Please see the history of present illness.     All other systems reviewed and are negative.   EKG:  EKG is not ordered today.    Recent Labs: 08/17/2019: TSH 0.615 09/02/2019: Magnesium 2.2 09/03/2019: B Natriuretic Peptide 1,032.6 09/08/2019: Hemoglobin 10.0; Platelets 176 09/09/2019: BUN 37; Creatinine, Ser 2.23; Potassium 4.7; Sodium 136   Recent Lipid Panel    Component Value Date/Time   CHOL 164 09/05/2019 0011   TRIG 57 09/05/2019 0011   HDL 78 09/05/2019 0011   CHOLHDL 2.1 09/05/2019 0011   VLDL 11 09/05/2019 0011   LDLCALC 75 09/05/2019 0011    Physical Exam:    VS:  BP (!) 126/58    Pulse 74    Ht 5\' 1"  (1.549 m)    Wt 110 lb (49.9 kg)    SpO2 97%    BMI 20.78 kg/m     Wt Readings from Last 6 Encounters:  09/17/19 110 lb (49.9 kg)  09/06/19 105 lb 6.1 oz (47.8 kg)  09/03/19 110 lb 6.4 oz (50.1 kg)  09/02/19 109 lb 12.6 oz (49.8 kg)  10/01/18 109 lb 11.2 oz (49.8 kg)    06/26/18 110 lb (49.9 kg)     Physical Exam  Constitutional: She is oriented to person, place, and time. She appears well-developed and well-nourished. No distress.  HENT:  Head: Normocephalic and atraumatic.  Neck: Normal range of motion. Neck supple. No JVD present.  Cardiovascular: Normal rate, regular rhythm, normal heart sounds and intact distal pulses. Exam reveals no gallop and no friction rub.  No murmur heard. Pulmonary/Chest: Effort normal and breath sounds normal. No respiratory distress. She has no wheezes. She has no rales.  Abdominal: Soft. Bowel sounds are normal.  Musculoskeletal: Normal range of motion.        General: No edema.  Neurological: She is alert and oriented to person, place, and time.  Skin: Skin is warm and dry.  Psychiatric: She has a normal mood and affect. Her behavior is normal. Judgment and thought content normal.  Vitals reviewed.    ASSESSMENT:    1. Acute on chronic combined systolic and diastolic CHF (congestive heart failure) ([place])   2. Takotsubo cardiomyopathy   3. Coronary artery disease involving native coronary artery of native heart without angina pectoris   4. Acute kidney injury superimposed on chronic kidney disease ([place])   5. Hyperlipidemia, unspecified hyperlipidemia type   6. Essential (primary) hypertension   7. Hypothyroidism, unspecified type  PLAN:    In order of problems listed above:  Acute on chronic combined systolic and diastolic heart failure/Takotsubo cardiomyopathy -Patient found to have decreased EF of 30-35% by echo on 08/27/2019 after complaints of tachypalpitations. -Catheterization done on 09/07/2019 noted significant diffuse LAD disease, felt unfavorable for intervention due to tortuosity and diffuse nature.  She had mild nonobstructive disease in the right coronary and left circumflex.  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 -Patient continues on  carvedilol.  BiDil was too expensive.  Unable to start ACE/arm/Spyro due to renal dysfunction.  The patient was switched to Imdur/hydralazine. -No signs of volume overload.  I would consider increasing her hydralazine but the patient had some complaints of mild lightheadedness with her evening dose.  She will take her hydralazine separated from her carvedilol in the evening.  CAD -Cardiac cath results as above. -Patient continues on beta-blocker, aspirin, statin. -Currently without anginal symptoms.  Acute on chronic kidney disease stage IV -It appears that prior baseline creatinine was about 1.6 -Followed by nephrology.  -Serum creatinine was up to 2.3 on the day of discharge from the hospital -We will check metabolic panel today.  Hyperlipidemia -On Crestor 20 mg daily -Lipid panel on 09/05/2019 showed LDL of 75 -Continue current therapy  Hypertension -Patient is on carvedilol and Imdur/hydralazine for heart failure  -Blood pressure currently well controlled.  Hypothyroidism -TSH was 0.615 on 08/17/2019.  On levothyroxine per PCP.  Medication Adjustments/Labs and Tests Ordered: Current medicines are reviewed at length with the patient today.  Concerns regarding medicines are outlined above. Labs and tests ordered and medication changes are outlined in the patient instructions below:  Patient Instructions  Medication Instructions:  Your physician recommends that you continue on your current medications as directed. Please refer to the Current Medication list given to you today.  If you need a refill on your cardiac medications before your next appointment, please call your pharmacy.   Lab work: TODAY: BMET  If you have labs (blood work) drawn today and your tests are completely normal, you will receive your results only by:  [employer] (if you have MyChart) OR  A paper copy in the mail If you have any lab test that is abnormal or we need to change your treatment, we will  call you to review the results.  Testing/Procedures: You have been referred to see a dietician (Someone will contact with you with an appointment)  Follow-Up: At [facility], you and your health needs are our priority.  As part of our continuing mission to provide you with exceptional heart care, we have created designated Provider Care Teams.  These Care Teams include your primary Cardiologist (physician) and Advanced Practice Providers (APPs -  Physician Assistants and Nurse Practitioners) who all work together to provide you with the care you need, when you need it. You will need a follow up appointment in:  3 months.  Please call our office 2 months in advance to schedule this appointment.  You may see Mertie Moores, MD or one of the following Advanced Practice Providers on your designated Care Team: Richardson Dopp, PA-C [place], [place]  Daune Perch, NP  Any Other Special Instructions Will Be Listed Below (If Applicable).   Lifestyle Modifications to Prevent and Treat Heart Disease -Recommend heart healthy/Mediterranean diet, with whole grains, fruits, vegetables, fish, lean meats, nuts, olive oil and avocado oil.  -Limit salt intake to less than 2000 mg per day.  -Recommend moderate  walking, starting slowly with a few minutes and working up to 3-5 times/week for 30-50 minutes each session. Aim for at least 150 minutes.week. Goal should be pace of 3 miles/hours, or walking 1.5 miles in 30 minutes -Recommend avoidance of tobacco products. Avoid excess alcohol. -Keep blood pressure well controlled, ideally less than 130/80.  ===================================  One of your heart tests showed weakness of the heart muscle. This may make you more susceptible to weight gain from fluid retention, which can lead to symptoms that we call heart failure. Please follow these special instructions:   1. Follow a low-salt diet - you are allowed no more than 2,000mg  of sodium per day. Watch your  fluid intake. In general, you should not be taking in more than 2 liters of fluid per day (no more than 8 glasses per day). This includes sources of water in foods like soup, coffee, tea, milk, etc. 2. Weigh yourself on the same scale at same time of day and keep a log. 3. Call your doctor: (Anytime you feel any of the following symptoms)  - 3lb weight gain overnight or 5lb within a few days - Shortness of breath, with or without a dry hacking cough  - Swelling in the hands, feet or stomach  - If you have to sleep on extra pillows at night in order to breathe   IT IS IMPORTANT TO LET YOUR DOCTOR KNOW EARLY ON IF YOU ARE HAVING SYMPTOMS SO WE CAN HELP YOU!    Signed, Daune Perch, NP  09/17/2019 5:33 PM    [facility]

## 2019-09-30 ENCOUNTER — Encounter (HOSPITAL_COMMUNITY): Payer: Self-pay

## 2019-10-04 ENCOUNTER — Telehealth: Payer: Self-pay | Admitting: Cardiology

## 2019-10-04 NOTE — Telephone Encounter (Signed)
Spoke with pt and pt has noted swelling to left lower leg Per pt swelling is not as bad in am but as day progresses leg increases in size.Pt also notes numbness to both feet Per pt watching salt intake and weight is stable Per pt kidney MD told pt to take Lasix but did not give any directions Kidney values are elevated and have been for quite sometime.Pt also called Dr Ruta Hinds office this am  as does have PVD disease in right leg according to last arterial ultrasound from Oct pt also wears compression stocking to right leg .Pt has appt with Dr Oneida Alar APP in a little over a week .I called and spoke with DR Artis Flock staff to see if will call pt back today as pt is anxious.Wll forward message to Pecolia Ades NP for review and recommendations.

## 2019-10-04 NOTE — Telephone Encounter (Signed)
New Message:   Pt c/o swelling: STAT is pt has developed SOB within 24 hours  1) How much weight have you gained and in what time span? *no weight gain  2) If swelling, where is the swelling located? Left foot and lower left leg- pt says she have PVD in her right leg  3) Are you currently taking a fluid pill? No- Kidney doctor suggested that she start taking Lasix  4) Are you currently SOB? no  5) Do you have a log of your daily weights (if so, list)? yes  6) Have you gained 3 pounds in a day or 5 pounds in a week? no  7) Have you traveled recently? no

## 2019-10-09 NOTE — Telephone Encounter (Signed)
I would have her continue to limit salt intake and elevated her leg some during the day. Agree with compression stocking and follow up with [facility].

## 2019-10-12 ENCOUNTER — Telehealth (HOSPITAL_COMMUNITY): Payer: Self-pay

## 2019-10-12 ENCOUNTER — Telehealth (HOSPITAL_COMMUNITY): Payer: Self-pay | Admitting: *Deleted

## 2019-10-12 ENCOUNTER — Other Ambulatory Visit: Payer: Self-pay

## 2019-10-12 DIAGNOSIS — I739 Peripheral vascular disease, unspecified: Secondary | ICD-10-CM

## 2019-10-12 NOTE — Telephone Encounter (Signed)
Cardiac Rehab Medication Review by a Pharmacist  Does the patient  feel that his/her medications are working for him/her?  No.   Has the patient been experiencing any side effects to the medications prescribed?  Yes. Feels lightheaded. Tries to space out medications.   Does the patient measure his/her own blood pressure or blood glucose at home?   Yes. Checking blood pressure twice daily in the morning and evening. Averages 140/80 in the morning before medication and 130/70 in evening.   Does the patient have any problems obtaining medications due to transportation or finances?   No   Understanding of regimen: excellent Understanding of indications: excellent Potential of compliance: excellent  Pharmacist comments: N/A  Cristela Felt, PharmD PGY1 Pharmacy Resident [employer]: [phone] 10/12/2019 3:40 PM

## 2019-10-12 NOTE — Telephone Encounter (Signed)

## 2019-10-13 ENCOUNTER — Ambulatory Visit (HOSPITAL_COMMUNITY)
Admission: RE | Admit: 2019-10-13 | Discharge: 2019-10-13 | Disposition: A | Discharge status: Home / Self Care | Payer: Medicare Other | Source: Ambulatory Visit | Attending: Family | Admitting: Family

## 2019-10-13 ENCOUNTER — Ambulatory Visit: Payer: Medicare Other | Admitting: Family

## 2019-10-13 ENCOUNTER — Other Ambulatory Visit: Payer: Self-pay

## 2019-10-13 DIAGNOSIS — I739 Peripheral vascular disease, unspecified: Secondary | ICD-10-CM | POA: Diagnosis present

## 2019-10-13 NOTE — Patient Instructions (Signed)
Intermittent Claudication Intermittent claudication is pain in one or both legs that occurs when walking or exercising and goes away when resting. Intermittent claudication is a symptom of peripheral arterial disease (PAD). This condition is commonly treated with rest, medicine, and healthy lifestyle changes. If medical management does not improve symptoms, surgery can be done to restore blood flow (revascularization) to the affected leg. What are the causes?  This condition is caused by buildup of fatty material (plaque) within the major arteries in the body (atherosclerosis). Plaque makes arteries stiff and narrow, which prevents enough blood from reaching the leg muscles. Pain occurs when you walk or exercise because your muscles need (but cannot get) more blood when you are moving and exercising. What increases the risk? The following factors may make you more likely to develop this condition:  A family history of atherosclerosis.  A personal history of stroke or heart disease.  Older age.  Being inactive (sedentary lifestyle).  Being overweight.  Smoking cigarettes.  Having another health condition such as: ? Diabetes. ? High blood pressure. ? High cholesterol. What are the signs or symptoms? Symptoms of this condition may first develop in the lower leg, and then they may spread to the thigh, hip, buttock, or the back of the lower leg (calf) over time. Symptoms may include:  Aches or pains.  Cramps.  A feeling of tightness, weakness, or heaviness.  A wound on the lower leg or foot that heals poorly or does not heal. How is this diagnosed? This condition may be diagnosed based on:  Your symptoms.  Your medical history.  Tests, such as: ? Blood tests. ? Arterial duplex ultrasound. This test uses images of blood vessels and surrounding organs to evaluate blood flow within arteries. ? Angiogram. In this procedure, dye is injected into arteries and then X-rays are taken.  ? Magnetic resonance angiogram (MRA). In this procedure, strong magnets and radio waves are used instead of X-rays to create images of blood vessels and blood flow. ? CT angiogram (CTA). In this procedure, a large X-ray machine called a CT scanner takes detailed pictures of blood vessels that have been injected with dye. ? Ankle-brachial index (ABI) test. This procedure measures blood pressure in the leg during exercise and at rest. ? Exercise test. For this test, you will walk on a treadmill while tests are done (such as the ABI test) to evaluate how this condition affects your ability to walk or exercise. How is this treated? Treatment for this condition may involve treatment for the underlying cause, such as treatment for high blood pressure, high cholesterol, or diabetes. Treatment may include:  Lifestyle changes such as: ? Starting a supervised or home-based exercise program. ? Losing weight. ? Quitting smoking.  Medicines to help restore blood flow through your legs.  Blood vessel surgery (angioplasty) to restore blood flow around the blocked vessel. This is also known as endovascular therapy (EVT). This is only done if your intermittent claudication is caused by severe peripheral artery disease, a condition in which blood flow is severely or totally restricted by the narrowing of the arteries. Follow these instructions at home: Lifestyle   Maintain a healthy weight.  Eat a diet that is low in saturated fats and calories. Consider working with a diet and nutrition specialist (dietitian) to help you make healthy food choices.  Do not use any products that contain nicotine or tobacco, such as cigarettes and e-cigarettes. If you need help quitting, ask your health care provider.  If  your health care provider recommended an exercise program for you, follow it as directed. Your exercise program may involve: ? Walking 3 or more times a week. ? Walking until you have certain symptoms of  intermittent claudication. ? Resting until symptoms go away. ? Gradually increasing your walking time to about 50 minutes a day. General instructions  Work with your health care provider to manage any other health conditions you may have, including diabetes, high blood pressure, or high cholesterol.  Take over-the-counter and prescription medicines only as told by your health care provider.  Keep all follow-up visits as told by your health care provider. This is important. Contact a health care provider if:  Your pain does not go away with rest.  You have sores on your legs that do not heal or have a bad smell or pus coming from them.  Your condition gets worse or does not get better with treatment. Get help right away if:  You have chest pain.  You have difficulty breathing.  You develop arm weakness.  You have trouble speaking.  Your face begins to droop.  Your foot or leg is cold or it changes color.  Your foot or leg becomes numb. These symptoms may represent a serious problem that is an emergency. Do not wait to see if the symptoms will go away. Get medical help right away. Call your local emergency services (911 in the [place] Do not drive yourself to the hospital.  Summary  Intermittent claudication is pain in one or both legs that occurs when walking or exercising and goes away when resting.  This condition is caused by buildup of fatty material (plaque) within the major arteries in the body (atherosclerosis). Plaque makes arteries stiff and narrow, which prevents enough blood from reaching the leg muscles.  Intermittent claudication can be treated with medicine and lifestyle changes. If medical treatment fails, surgery can be done to help return blood flow to the affected area.  Make sure you work with your health care provider to manage any other health conditions you may have, including diabetes, high blood pressure, or high cholesterol. This information is not  intended to replace advice given to you by your health care provider. Make sure you discuss any questions you have with your health care provider. Document Released: 10/11/2004 Document Revised: 11/21/2017 Document Reviewed: 01/09/2017 [employer] Patient Education  2020 [employer].

## 2019-10-19 ENCOUNTER — Ambulatory Visit (HOSPITAL_COMMUNITY): Payer: Medicare Other

## 2019-10-20 ENCOUNTER — Telehealth (HOSPITAL_COMMUNITY): Payer: Self-pay | Admitting: *Deleted

## 2019-10-20 NOTE — Telephone Encounter (Signed)
PC to pt to confirm appointment and review health history.  No answer and no voicemail set up to leave message.

## 2019-10-21 ENCOUNTER — Encounter (HOSPITAL_COMMUNITY)
Admission: RE | Admit: 2019-10-21 | Discharge: 2019-10-21 | Disposition: A | Discharge status: Home / Self Care | Payer: Medicare Other | Source: Ambulatory Visit | Attending: Cardiovascular Disease | Admitting: Cardiovascular Disease

## 2019-10-21 ENCOUNTER — Other Ambulatory Visit: Payer: Self-pay

## 2019-10-21 VITALS — BP 102/56 | HR 75 | Temp 99.3°F | Ht 62.25 in | Wt 106.9 lb

## 2019-10-21 DIAGNOSIS — I214 Non-ST elevation (NSTEMI) myocardial infarction: Secondary | ICD-10-CM

## 2019-10-21 NOTE — Progress Notes (Signed)
Cardiac Individual Treatment Plan  Patient Details  Name: Shelly [name] MRN: [id number] Date of Birth: July 25, 1941 Referring Provider:     CARDIAC REHAB PHASE II ORIENTATION from 10/21/2019 in [place]  Referring Provider  Dr. Cathie Olden      Initial Encounter Date:    CARDIAC REHAB PHASE II ORIENTATION from 10/21/2019 in [place]  Date  10/21/19      Visit Diagnosis: NSTEMI (non-ST elevated myocardial infarction) [facility])  Patient's Home Medications on Admission:  Current Outpatient Medications:  .  aspirin EC 81 MG tablet, Take 81 mg by mouth daily., Disp: , Rfl:  .  Biotin 1 MG CAPS, Take 1 mg by mouth daily. , Disp: , Rfl:  .  buPROPion (WELLBUTRIN XL) 150 MG 24 hr tablet, Take 150 mg by mouth every morning. , Disp: , Rfl:  .  carvedilol (COREG) 12.5 MG tablet, Take 1 tablet (12.5 mg total) by mouth 2 (two) times daily with a meal., Disp: 180 tablet, Rfl: 3 .  cetirizine (ZYRTEC) 10 MG tablet, Take 10 mg by mouth daily., Disp: , Rfl:  .  Cholecalciferol (VITAMIN D-3) 25 MCG (1000 UT) CAPS, Take 1,000 Units by mouth daily., Disp: , Rfl:  .  furosemide (LASIX) 20 MG tablet, Take 20 mg by mouth daily., Disp: , Rfl:  .  hydrALAZINE (APRESOLINE) 25 MG tablet, Take 1.5 tablets (37.5 mg total) by mouth 2 (two) times daily., Disp: 180 tablet, Rfl: 3 .  isosorbide mononitrate (IMDUR) 30 MG 24 hr tablet, Take 1 tablet (30 mg total) by mouth daily., Disp: 90 tablet, Rfl: 3 .  Multiple Vitamins-Minerals (PRESERVISION/LUTEIN) CAPS, Take 1 capsule by mouth every morning. , Disp: , Rfl:  .  rosuvastatin (CRESTOR) 20 MG tablet, Take 1 tablet (20 mg total) by mouth daily at 6 PM., Disp: 90 tablet, Rfl: 3 .  SYNTHROID 50 MCG tablet, Take 50 mcg by mouth daily before breakfast. Take one tablet (50 mcg)  by mouth Saturday and Sunday before breakfast. Take 1.5 tablets (75 mcg) M-F., Disp: , Rfl:   Past Medical History: Past Medical History:   Diagnosis Date  . Anxiety   . Arthritis    right elbow  . 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 Breast cancer ([place]) 03/2010   left breast s/p lumpectomy; adjuvant radiation; on adjuvant homrnal therapy  . Chronic kidney disease    stage 3  . COPD (chronic obstructive pulmonary disease) ([place])   . Hay fever   . History of breast cancer 09/29/2012  . Hypertension   . Hypothyroidism   . Macular degeneration   . Migraine   . MVP (mitral valve prolapse)   . Osteopenia   . PONV (postoperative nausea and vomiting)   . Right arm fracture   . Sciatic pain   . Scoliosis     Tobacco Use: Social History   Tobacco Use  Smoking Status Former Smoker  . Packs/day: 0.50  . Years: 25.00  . Pack years: 12.50  . Quit date: 08/07/1985  . Years since quitting: 34.2  Smokeless Tobacco Never Used    Labs: Recent Review Scientist, physiological    Labs for ITP Cardiac and Pulmonary Rehab Latest Ref Rng & Units 09/05/2019   Cholestrol 0 - 200 mg/dL 164   LDLCALC 0 - 99 mg/dL 75   HDL >40 mg/dL 78   Trlycerides <150 mg/dL 57      Capillary Blood Glucose: No  results found for: GLUCAP   Exercise Target Goals: Exercise Program Goal: Individual exercise prescription set using results from initial 6 min walk test and THRR while considering  patient's activity barriers and safety.   Exercise Prescription Goal: Initial exercise prescription builds to 30-45 minutes a day of aerobic activity, 2-3 days per week.  Home exercise guidelines will be given to patient during program as part of exercise prescription that the participant will acknowledge.  Activity Barriers & Risk Stratification: Activity Barriers & Cardiac Risk Stratification - 10/21/19 1356      Activity Barriers & Cardiac Risk Stratification   Activity Barriers  Arthritis;Back Problems;Other (comment)    Comments  PAD    Cardiac Risk Stratification  High       6 Minute Walk: 6 Minute Walk    Row Name 10/21/19 1355          6 Minute Walk   Phase  Initial     Distance  1188 feet     Walk Time  6 minutes     # of Rest Breaks  0     MPH  2.25     METS  2.41     RPE  12     Perceived Dyspnea   0     VO2 Peak  8.44     Symptoms  No     Resting HR  75 bpm     Resting BP  102/56     Resting Oxygen Saturation   96 %     Exercise Oxygen Saturation  during 6 min walk  97 %     Max Ex. HR  99 bpm     Max Ex. BP  124/62     2 Minute Post BP  102/56        Oxygen Initial Assessment:   Oxygen Re-Evaluation:   Oxygen Discharge (Final Oxygen Re-Evaluation):   Initial Exercise Prescription: Initial Exercise Prescription - 10/21/19 1400      Date of Initial Exercise RX and Referring Provider   Date  10/21/19    Referring Provider  Dr. Cathie Olden    Expected Discharge Date  12/15/19      Treadmill   MPH  1.7    Grade  0    Minutes  15      NuStep   Level  2    SPM  75    Minutes  15    METs  2      Prescription Details   Frequency (times per week)  3    Duration  Progress to 30 minutes of continuous aerobic without signs/symptoms of physical distress      Intensity   THRR 40-80% of Max Heartrate  56-112    Ratings of Perceived Exertion  11-13      Progression   Progression  Continue to progress workloads to maintain intensity without signs/symptoms of physical distress.      Resistance Training   Training Prescription  Yes    Weight  2 lbs.     Reps  10-15       Perform Capillary Blood Glucose checks as needed.  Exercise Prescription Changes:   Exercise Comments:   Exercise Goals and Review: Exercise Goals    Row Name 10/21/19 1411             Exercise Goals   Increase Physical Activity  Yes       Intervention  Provide advice, education, support and counseling about physical  activity/exercise needs.;Develop an individualized exercise prescription for aerobic and resistive training based on initial evaluation findings, risk stratification, comorbidities and participant's  personal goals.       Expected Outcomes  Short Term: Attend rehab on a regular basis to increase amount of physical activity.;Long Term: Add in home exercise to make exercise part of routine and to increase amount of physical activity.;Long Term: Exercising regularly at least 3-5 days a week.       Increase Strength and Stamina  Yes       Intervention  Provide advice, education, support and counseling about physical activity/exercise needs.;Develop an individualized exercise prescription for aerobic and resistive training based on initial evaluation findings, risk stratification, comorbidities and participant's personal goals.       Expected Outcomes  Short Term: Increase workloads from initial exercise prescription for resistance, speed, and METs.;Short Term: Perform resistance training exercises routinely during rehab and add in resistance training at home;Long Term: Improve cardiorespiratory fitness, muscular endurance and strength as measured by increased METs and functional capacity (6MWT)       Able to understand and use rate of perceived exertion (RPE) scale  Yes       Intervention  Provide education and explanation on how to use RPE scale       Expected Outcomes  Short Term: Able to use RPE daily in rehab to express subjective intensity level;Long Term:  Able to use RPE to guide intensity level when exercising independently       Knowledge and understanding of Target Heart Rate Range (THRR)  Yes       Intervention  Provide education and explanation of THRR including how the numbers were predicted and where they are located for reference       Expected Outcomes  Short Term: Able to state/look up THRR;Long Term: Able to use THRR to govern intensity when exercising independently;Short Term: Able to use daily as guideline for intensity in rehab       Able to check pulse independently  Yes       Intervention  Provide education and demonstration on how to check pulse in carotid and radial  arteries.;Review the importance of being able to check your own pulse for safety during independent exercise       Expected Outcomes  Short Term: Able to explain why pulse checking is important during independent exercise;Long Term: Able to check pulse independently and accurately       Understanding of Exercise Prescription  Yes       Intervention  Provide education, explanation, and written materials on patient's individual exercise prescription       Expected Outcomes  Short Term: Able to explain program exercise prescription;Long Term: Able to explain home exercise prescription to exercise independently       Improve claudication pain toleration; Improve walking ability  Yes       Intervention  Participate in PAD/SET Rehab 2-3 days a week, walking at home as part of exercise prescription;Attend education sessions to aid in risk factor modification and understanding of disease process       Expected Outcomes  Short Term: Improve walking distance/time to onset of claudication pain;Long Term: Improve score of PAD questionnaires;Long Term: Improve walking ability and toleration to claudication          Exercise Goals Re-Evaluation :   Discharge Exercise Prescription (Final Exercise Prescription Changes):   Nutrition:  Target Goals: Understanding of nutrition guidelines, daily intake of sodium 1500mg , cholesterol 200mg , calories 30% from  fat and 7% or less from saturated fats, daily to have 5 or more servings of fruits and vegetables.  Biometrics: Pre Biometrics - 10/21/19 1357      Pre Biometrics   Height  5' 2.25" (1.581 m)    Weight  48.5 kg    Waist Circumference  28 inches    Hip Circumference  35.5 inches    Waist to Hip Ratio  0.79 %    BMI (Calculated)  19.4    Triceps Skinfold  14 mm    % Body Fat  29.8 %    Grip Strength  22 kg    Flexibility  11 in    Single Leg Stand  16.18 seconds        Nutrition Therapy Plan and Nutrition Goals:   Nutrition  Assessments:   Nutrition Goals Re-Evaluation:   Nutrition Goals Re-Evaluation:   Nutrition Goals Discharge (Final Nutrition Goals Re-Evaluation):   Psychosocial: Target Goals: Acknowledge presence or absence of significant depression and/or stress, maximize coping skills, provide positive support system. Participant is able to verbalize types and ability to use techniques and skills needed for reducing stress and depression.  Initial Review & Psychosocial Screening: Initial Psych Review & Screening - 10/21/19 1426      Initial Review   Current issues with  History of Depression;Current Anxiety/Panic;Current Stress Concerns    Source of Stress Concerns  Chronic Illness;Family;Unable to participate in former interests or hobbies    Comments  Chrystie reports some stress and axiety r/t her husband's health and her own. She has a hisotry of depression but does not express any depression currently.  She feels that she could do better with managing her stress. She enjoys art classes and reports that she feels she would have more time to participate in this hobby now that she has sold her second home.      Family Dynamics   Good Support System?  Yes   Konnor states that her husband and children are sources of support for her.     Barriers   Psychosocial barriers to participate in program  The patient should benefit from training in stress management and relaxation.      Screening Interventions   Interventions  Encouraged to exercise;To provide support and resources with identified psychosocial needs       Quality of Life Scores: Quality of Life - 10/21/19 1529      Quality of Life   Select  Quality of Life      Quality of Life Scores   Health/Function Pre  15.93 %    Socioeconomic Pre  18.17 %    Psych/Spiritual Pre  17.79 %    Family Pre  20.4 %    GLOBAL Pre  17.41 %      Scores of 19 and below usually indicate a poorer quality of life in these areas.  A difference of  2-3  points is a clinically meaningful difference.  A difference of 2-3 points in the total score of the Quality of Life Index has been associated with significant improvement in overall quality of life, self-image, physical symptoms, and general health in studies assessing change in quality of life.  PHQ-9: Recent Review Flowsheet Data    Depression screen [facility] 2/9 10/21/2019   Decreased Interest 0   Down, Depressed, Hopeless 0   PHQ - 2 Score 0     Interpretation of Total Score  Total Score Depression Severity:  1-4 = Minimal depression, 5-9 =  Mild depression, 10-14 = Moderate depression, 15-19 = Moderately severe depression, 20-27 = Severe depression   Psychosocial Evaluation and Intervention:   Psychosocial Re-Evaluation:   Psychosocial Discharge (Final Psychosocial Re-Evaluation):   Vocational Rehabilitation: Provide vocational rehab assistance to qualifying candidates.   Vocational Rehab Evaluation & Intervention: Vocational Rehab - 10/21/19 1427      Initial Vocational Rehab Evaluation & Intervention   Assessment shows need for Vocational Rehabilitation  No       Education: Education Goals: Education classes will be provided on a weekly basis, covering required topics. Participant will state understanding/return demonstration of topics presented.  Learning Barriers/Preferences: Learning Barriers/Preferences - 10/21/19 1432      Learning Barriers/Preferences   Learning Barriers  Sight    Learning Preferences  Written Material;Pictoral       Education Topics: Count Your Pulse:  -Group instruction provided by verbal instruction, demonstration, patient participation and written materials to support subject.  Instructors address importance of being able to find your pulse and how to count your pulse when at home without a heart monitor.  Patients get hands on experience counting their pulse with staff help and individually.   Heart Attack, Angina, and Risk Factor  Modification:  -Group instruction provided by verbal instruction, video, and written materials to support subject.  Instructors address signs and symptoms of angina and heart attacks.    Also discuss risk factors for heart disease and how to make changes to improve heart health risk factors.   Functional Fitness:  -Group instruction provided by verbal instruction, demonstration, patient participation, and written materials to support subject.  Instructors address safety measures for doing things around the house.  Discuss how to get up and down off the floor, how to pick things up properly, how to safely get out of a chair without assistance, and balance training.   Meditation and Mindfulness:  -Group instruction provided by verbal instruction, patient participation, and written materials to support subject.  Instructor addresses importance of mindfulness and meditation practice to help reduce stress and improve awareness.  Instructor also leads participants through a meditation exercise.    Stretching for Flexibility and Mobility:  -Group instruction provided by verbal instruction, patient participation, and written materials to support subject.  Instructors lead participants through series of stretches that are designed to increase flexibility thus improving mobility.  These stretches are additional exercise for major muscle groups that are typically performed during regular warm up and cool down.   Hands Only CPR:  -Group verbal, video, and participation provides a basic overview of AHA guidelines for community CPR. Role-play of emergencies allow participants the opportunity to practice calling for help and chest compression technique with discussion of AED use.   Hypertension: -Group verbal and written instruction that provides a basic overview of hypertension including the most recent diagnostic guidelines, risk factor reduction with self-care instructions and medication  management.    Nutrition I class: Heart Healthy Eating:  -Group instruction provided by PowerPoint slides, verbal discussion, and written materials to support subject matter. The instructor gives an explanation and review of the Therapeutic Lifestyle Changes diet recommendations, which includes a discussion on lipid goals, dietary fat, sodium, fiber, plant stanol/sterol esters, sugar, and the components of a well-balanced, healthy diet.   Nutrition II class: Lifestyle Skills:  -Group instruction provided by PowerPoint slides, verbal discussion, and written materials to support subject matter. The instructor gives an explanation and review of label reading, grocery shopping for heart health, heart healthy recipe modifications, and  ways to make healthier choices when eating out.   Diabetes Question & Answer:  -Group instruction provided by PowerPoint slides, verbal discussion, and written materials to support subject matter. The instructor gives an explanation and review of diabetes co-morbidities, pre- and post-prandial blood glucose goals, pre-exercise blood glucose goals, signs, symptoms, and treatment of hypoglycemia and hyperglycemia, and foot care basics.   Diabetes Blitz:  -Group instruction provided by PowerPoint slides, verbal discussion, and written materials to support subject matter. The instructor gives an explanation and review of the physiology behind type 1 and type 2 diabetes, diabetes medications and rational behind using different medications, pre- and post-prandial blood glucose recommendations and Hemoglobin A1c goals, diabetes diet, and exercise including blood glucose guidelines for exercising safely.    Portion Distortion:  -Group instruction provided by PowerPoint slides, verbal discussion, written materials, and food models to support subject matter. The instructor gives an explanation of serving size versus portion size, changes in portions sizes over the last 20 years,  and what consists of a serving from each food group.   Stress Management:  -Group instruction provided by verbal instruction, video, and written materials to support subject matter.  Instructors review role of stress in heart disease and how to cope with stress positively.     Exercising on Your Own:  -Group instruction provided by verbal instruction, power point, and written materials to support subject.  Instructors discuss benefits of exercise, components of exercise, frequency and intensity of exercise, and end points for exercise.  Also discuss use of nitroglycerin and activating EMS.  Review options of places to exercise outside of rehab.  Review guidelines for sex with heart disease.   Cardiac Drugs I:  -Group instruction provided by verbal instruction and written materials to support subject.  Instructor reviews cardiac drug classes: antiplatelets, anticoagulants, beta blockers, and statins.  Instructor discusses reasons, side effects, and lifestyle considerations for each drug class.   Cardiac Drugs II:  -Group instruction provided by verbal instruction and written materials to support subject.  Instructor reviews cardiac drug classes: angiotensin converting enzyme inhibitors (ACE-I), angiotensin II receptor blockers (ARBs), nitrates, and calcium channel blockers.  Instructor discusses reasons, side effects, and lifestyle considerations for each drug class.   Anatomy and Physiology of the Circulatory System:  Group verbal and written instruction and models provide basic cardiac anatomy and physiology, with the coronary electrical and arterial systems. Review of: AMI, Angina, Valve disease, Heart Failure, Peripheral Artery Disease, Cardiac Arrhythmia, Pacemakers, and the ICD.   Other Education:  -Group or individual verbal, written, or video instructions that support the educational goals of the cardiac rehab program.   Holiday Eating Survival Tips:  -Group instruction provided by  PowerPoint slides, verbal discussion, and written materials to support subject matter. The instructor gives patients tips, tricks, and techniques to help them not only survive but enjoy the holidays despite the onslaught of food that accompanies the holidays.   Knowledge Questionnaire Score: Knowledge Questionnaire Score - 10/21/19 1432      Knowledge Questionnaire Score   Pre Score  22/24       Core Components/Risk Factors/Patient Goals at Admission: Personal Goals and Risk Factors at Admission - 10/21/19 1411      Core Components/Risk Factors/Patient Goals on Admission    Weight Management  Yes;Weight Maintenance;Weight Gain    Intervention  Weight Management: Develop a combined nutrition and exercise program designed to reach desired caloric intake, while maintaining appropriate intake of nutrient and fiber, sodium and fats, and  appropriate energy expenditure required for the weight goal.;Weight Management: Provide education and appropriate resources to help participant work on and attain dietary goals.    Admit Weight  106 lb 14.8 oz (48.5 kg)    Expected Outcomes  Short Term: Continue to assess and modify interventions until short term weight is achieved;Long Term: Adherence to nutrition and physical activity/exercise program aimed toward attainment of established weight goal;Weight Maintenance: Understanding of the daily nutrition guidelines, which includes 25-35% calories from fat, 7% or less cal from saturated fats, less than 200mg  cholesterol, less than 1.5gm of sodium, & 5 or more servings of fruits and vegetables daily;Understanding recommendations for meals to include 15-35% energy as protein, 25-35% energy from fat, 35-60% energy from carbohydrates, less than 200mg  of dietary cholesterol, 20-35 gm of total fiber daily;Understanding of distribution of calorie intake throughout the day with the consumption of 4-5 meals/snacks;Weight Gain: Understanding of general recommendations for a  high calorie, high protein meal plan that promotes weight gain by distributing calorie intake throughout the day with the consumption for 4-5 meals, snacks, and/or supplements    Hypertension  Yes    Intervention  Provide education on lifestyle modifcations including regular physical activity/exercise, weight management, moderate sodium restriction and increased consumption of fresh fruit, vegetables, and low fat dairy, alcohol moderation, and smoking cessation.;Monitor prescription use compliance.    Expected Outcomes  Short Term: Continued assessment and intervention until BP is < 140/43mm HG in hypertensive participants. < 130/65mm HG in hypertensive participants with diabetes, heart failure or chronic kidney disease.;Long Term: Maintenance of blood pressure at goal levels.    Lipids  Yes    Intervention  Provide education and support for participant on nutrition & aerobic/resistive exercise along with prescribed medications to achieve LDL 70mg , HDL >40mg .    Expected Outcomes  Short Term: Participant states understanding of desired cholesterol values and is compliant with medications prescribed. Participant is following exercise prescription and nutrition guidelines.;Long Term: Cholesterol controlled with medications as prescribed, with individualized exercise RX and with personalized nutrition plan. Value goals: LDL < 70mg , HDL > 40 mg.       Core Components/Risk Factors/Patient Goals Review:    Core Components/Risk Factors/Patient Goals at Discharge (Final Review):    ITP Comments: ITP Comments    Row Name 10/21/19 1328           ITP Comments  Dr. Fransico Him, Medical Director          Comments: Patient attended orientation on 10/21/2019 to review rules and guidelines for program.  Completed 6 minute walk test, Intitial ITP, and exercise prescription.  VSS. Telemetry-SR with 1st degree block.  Asymptomatic. Safety measures and social distancing in place per CDC guidelines.

## 2019-10-22 ENCOUNTER — Encounter: Payer: Medicare Other | Attending: Cardiology | Admitting: Dietician

## 2019-10-22 DIAGNOSIS — N184 Chronic kidney disease, stage 4 (severe): Secondary | ICD-10-CM | POA: Insufficient documentation

## 2019-10-22 NOTE — Patient Instructions (Signed)
Choose cranberry juice or lemonade rather than apple juice Continue a low sodium diet Continue tor read labels  Choose low sodium  Choose foods without added potassium  Choose without added phosphorous. Consider simple meal plans  Resources: [facility]

## 2019-10-22 NOTE — Progress Notes (Signed)
  Medical Nutrition Therapy:  Appt start time: 1937 end time:  1200.   Assessment:  Primary concerns today: Patient is here today alone. She was referred for /ckd stage 4.  Hx includes MI, CH, PAD, HTN, bipolar depression, osteopenia, MVP.  States that kidney function decreased with each pregnancy.  Weight today 105 lbs stable for the past month.  UBW 110-112 lbs.  She states that she lost weight over the past year due to feeling poorly  And reduced appetite due to CKD. She has been trying to follow a low potassium and low sodium diet. She was allergic to eggs severely as a child but can now eat them.  Patient lives with her husband.  She does most of the shopping and they share cooking.  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  They have a mountain house which they are selling and states increased stress with cleaning this to sell.  Preferred Learning Style:   No preference indicated   Learning Readiness:   Ready  Change in progress   MEDICATIONS: see list   DIETARY INTAKE: Tries to follow a low sodium, low potassium diet. 24-hr recall:  B ( AM): 1 egg, 1-2 slices white toast, fruit OR 1-2 slices french toast with honey, fruit or maple syrup OR special K w/ almonds, 1% milk, berries or occasional 1/2 banana OR creamed salmon on toast Snk ( AM): none  L ( PM): leftover meat, vegetable, roll OR PB&J sandwich OR 1/2 Corelife salad, grilled chicken with balsamic vinegar and oil, bread Snk ( PM): none D ( PM): fish or chicken, yellow squash (homemade casserole with squash or other low potassium vegetable, eggs, saltines), rice or pasta or noodles Snk ( PM): occasional ice cream Beverages: water, herbal tea, flavored water, apple juice  Usual physical activity: walks 10-15 minutes with the dogs, and stationary bike periodically.  She is to start Cardiac Rehab Monday.  Estimated energy needs: 1500-1600 calories 40 g protein  Progress Towards Goal(s):  In  progress.   Nutritional Diagnosis:  NB-1.1 Food and nutrition-related knowledge deficit As related to CKD and diet.  As evidenced by diet hx and patient report.    Intervention:  Nutrition education related to CKD and nutrition.  Discussed protein, sodium, potassium, and phosphorous needs.  Discussed resources including recipe resources. Discussed meal planning and importance of eating adequate amounts to aim towards weight gain.  Discussed using healthy fats as a means to increase calories.  PLAN: Choose cranberry juice or lemonade rather than apple juice Continue a low sodium diet Continue tor read labels  Choose low sodium  Choose foods without added potassium  Choose without added phosphorous. Consider simple meal plans  Resources: Kidney.org [employer]   Teaching Method Utilized:  Visual Auditory Hands on  Handouts given during visit include:  National Kidney Diet:  Dish up a Kidney Friendly meal  Copy of the fruit and vegetable pages from the choose a meal book  Barriers to learning/adherence to lifestyle change: none  Demonstrated degree of understanding via:  Teach Back   Monitoring/Evaluation:  Dietary intake, exercise, label reading, and body weight prn.

## 2019-10-25 ENCOUNTER — Encounter (HOSPITAL_COMMUNITY)
Admission: RE | Admit: 2019-10-25 | Discharge: 2019-10-25 | Disposition: A | Discharge status: Home / Self Care | Payer: Medicare Other | Source: Ambulatory Visit | Attending: Cardiovascular Disease | Admitting: Cardiovascular Disease

## 2019-10-25 ENCOUNTER — Other Ambulatory Visit: Payer: Self-pay

## 2019-10-25 DIAGNOSIS — I214 Non-ST elevation (NSTEMI) myocardial infarction: Secondary | ICD-10-CM | POA: Insufficient documentation

## 2019-10-25 NOTE — Progress Notes (Signed)
Daily Session Note  Patient Details  Name: Shelly [name] MRN: [id number] Date of Birth: September 16, 1941 Referring Provider:     CARDIAC REHAB PHASE II ORIENTATION from 10/21/2019 in [place]  Referring Provider  Dr. Cathie Olden      Encounter Date: 10/25/2019  Check In: Session Check In - 10/25/19 1512      Check-In   Supervising physician immediately available to respond to emergencies  [facility] immediately available    Physician(s)  Dr. Tawanna Solo    Location  [facility]    Staff Present  Seward Carol, MS, ACSM CEP, Exercise Physiologist;Tyara Carol Ada, MS,ACSM CEP, Exercise Physiologist;Julya Alioto, RN, Toma Deiters, RN, BSN;Brittany Durene Fruits, BS, ACSM CEP, Exercise Physiologist    Virtual Visit  No    Medication changes reported      No    Fall or balance concerns reported     No    Tobacco Cessation  No Change    Warm-up and Cool-down  Performed on first and last piece of equipment    Resistance Training Performed  Yes    VAD Patient?  No    PAD/SET Patient?  No      Pain Assessment   Currently in Pain?  No/denies    Multiple Pain Sites  No       Capillary Blood Glucose: No results found for this or any previous visit (from the past 24 hour(s)).  Exercise Prescription Changes - 10/25/19 1600      Response to Exercise   Blood Pressure (Admit)  118/70    Blood Pressure (Exercise)  138/48    Blood Pressure (Exit)  100/40    Heart Rate (Admit)  86 bpm    Heart Rate (Exercise)  92 bpm    Heart Rate (Exit)  86 bpm    Rating of Perceived Exertion (Exercise)  13    Symptoms  Leg fatigue/weakness.    Comments  Pt moved from TM to RB due to leg fatigue and unsteadiness on TM. Tollerated RB better.    Duration  Progress to 30 minutes of  aerobic without signs/symptoms of physical distress    Intensity  THRR unchanged      Progression   Progression  Continue to progress workloads to maintain intensity without  signs/symptoms of physical distress.    Average METs  1.8      Resistance Training   Training Prescription  Yes    Weight  2 lbs.     Reps  10-15    Time  10 Minutes      Interval Training   Interval Training  No      Treadmill   MPH  --    Grade  --    Minutes  --      Recumbant Bike   Level  1    Minutes  15    METs  1.9      NuStep   Level  2    SPM  75    Minutes  15    METs  1.7       Social History   Tobacco Use  Smoking Status Former Smoker  . Packs/day: 0.50  . Years: 25.00  . Pack years: 12.50  . Quit date: 08/07/1985  . Years since quitting: 34.2  Smokeless Tobacco Never Used    Goals Met:  No report of cardiac concerns or symptoms  Goals Unmet:  Not Applicable  Comments: Danell started cardiac  rehab today. Jessica tried the treadmill but was able to stay on due to complaints of leg weakness and fatigue. Patient was moved to the recumbent bike and tolerated that better. VSS, telemetry-Sinus Rhythm  Medication list reconciled. Pt denies barriers to medicaiton compliance.  PSYCHOSOCIAL ASSESSMENT:  PHQ-0. Pt exhibits positive coping skills, hopeful outlook with supportive family. No psychosocial needs identified at this time, no psychosocial interventions necessary.    Pt enjoys reading.   Pt oriented to exercise equipment and routine.    Understanding verbalized.Barnet Pall, RN,BSN 10/26/2019 9:16 AM   Dr. Fransico Him is Medical Director for Cardiac Rehab at [facility].

## 2019-10-27 ENCOUNTER — Encounter (HOSPITAL_COMMUNITY)
Admission: RE | Admit: 2019-10-27 | Discharge: 2019-10-27 | Disposition: A | Discharge status: Home / Self Care | Payer: Medicare Other | Source: Ambulatory Visit | Attending: Cardiovascular Disease | Admitting: Cardiovascular Disease

## 2019-10-27 ENCOUNTER — Other Ambulatory Visit: Payer: Self-pay

## 2019-10-27 DIAGNOSIS — I214 Non-ST elevation (NSTEMI) myocardial infarction: Secondary | ICD-10-CM

## 2019-10-28 DIAGNOSIS — Z9189 Other specified personal risk factors, not elsewhere classified: Secondary | ICD-10-CM | POA: Insufficient documentation

## 2019-10-29 ENCOUNTER — Encounter (HOSPITAL_COMMUNITY): Payer: Medicare Other

## 2019-11-01 ENCOUNTER — Encounter (HOSPITAL_COMMUNITY)
Admission: RE | Admit: 2019-11-01 | Discharge: 2019-11-01 | Disposition: A | Discharge status: Home / Self Care | Payer: Medicare Other | Source: Ambulatory Visit | Attending: Cardiovascular Disease | Admitting: Cardiovascular Disease

## 2019-11-01 ENCOUNTER — Other Ambulatory Visit: Payer: Self-pay

## 2019-11-01 VITALS — Ht 62.25 in | Wt 106.9 lb

## 2019-11-01 DIAGNOSIS — I214 Non-ST elevation (NSTEMI) myocardial infarction: Secondary | ICD-10-CM | POA: Diagnosis not present

## 2019-11-02 NOTE — Progress Notes (Signed)
Shelly [name] 78 y.o. female Nutrition Note  Visit Diagnosis: NSTEMI (non-ST elevated myocardial infarction) [facility])   Past Medical History:  Diagnosis Date  . Anxiety   . Arthritis    right elbow  . 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 Breast cancer ([place]) 03/2010   left breast s/p lumpectomy; adjuvant radiation; on adjuvant homrnal therapy  . Chronic kidney disease    stage 3  . COPD (chronic obstructive pulmonary disease) ([place])   . Hay fever   . History of breast cancer 09/29/2012  . Hypertension   . Hypothyroidism   . Macular degeneration   . Migraine   . MVP (mitral valve prolapse)   . Osteopenia   . PONV (postoperative nausea and vomiting)   . Right arm fracture   . Sciatic pain   . Scoliosis      Medications reviewed.   Current Outpatient Medications:  .  aspirin EC 81 MG tablet, Take 81 mg by mouth daily., Disp: , Rfl:  .  Biotin 1 MG CAPS, Take 1 mg by mouth daily. , Disp: , Rfl:  .  buPROPion (WELLBUTRIN XL) 150 MG 24 hr tablet, Take 150 mg by mouth every morning. , Disp: , Rfl:  .  carvedilol (COREG) 12.5 MG tablet, Take 1 tablet (12.5 mg total) by mouth 2 (two) times daily with a meal., Disp: 180 tablet, Rfl: 3 .  cetirizine (ZYRTEC) 10 MG tablet, Take 10 mg by mouth daily., Disp: , Rfl:  .  Cholecalciferol (VITAMIN D-3) 25 MCG (1000 UT) CAPS, Take 1,000 Units by mouth daily., Disp: , Rfl:  .  furosemide (LASIX) 20 MG tablet, Take 20 mg by mouth daily., Disp: , Rfl:  .  hydrALAZINE (APRESOLINE) 25 MG tablet, Take 1.5 tablets (37.5 mg total) by mouth 2 (two) times daily., Disp: 180 tablet, Rfl: 3 .  isosorbide mononitrate (IMDUR) 30 MG 24 hr tablet, Take 1 tablet (30 mg total) by mouth daily., Disp: 90 tablet, Rfl: 3 .  Multiple Vitamins-Minerals (PRESERVISION AREDS PO), Take by mouth., Disp: , Rfl:  .  Multiple Vitamins-Minerals (PRESERVISION/LUTEIN) CAPS, Take 1 capsule by mouth every morning. , Disp: , Rfl:  .  rosuvastatin (CRESTOR) 20 MG tablet,  Take 1 tablet (20 mg total) by mouth daily at 6 PM., Disp: 90 tablet, Rfl: 3 .  SYNTHROID 50 MCG tablet, Take 50 mcg by mouth daily before breakfast. Take one tablet (50 mcg)  by mouth Saturday and Sunday before breakfast. Take 1.5 tablets (75 mcg) M-F., Disp: , Rfl:    Ht Readings from Last 1 Encounters:  10/22/19 5\' 2"  (1.575 m)     Wt Readings from Last 3 Encounters:  10/22/19 105 lb (47.6 kg)  10/21/19 106 lb 14.8 oz (48.5 kg)  09/17/19 110 lb (49.9 kg)     There is no height or weight on file to calculate BMI.   Social History   Tobacco Use  Smoking Status Former Smoker  . Packs/day: 0.50  . Years: 25.00  . Pack years: 12.50  . Quit date: 08/07/1985  . Years since quitting: 34.2  Smokeless Tobacco Never Used     Lab Results  Component Value Date   CHOL 164 09/05/2019   Lab Results  Component Value Date   HDL 78 09/05/2019   Lab Results  Component Value Date   LDLCALC 75 09/05/2019   Lab Results  Component Value Date   TRIG 57 09/05/2019   Lab Results  Component Value  Date   CHOLHDL 2.1 09/05/2019     No results found for: HGBA1C   CBG (last 3)  No results for input(s): GLUCAP in the last 72 hours.   Nutrition Note  Spoke with pt. Nutrition Plan and Nutrition Survey goals reviewed with pt. Pt is following a Heart Healthy diet.   Pt with dx of CHF. Per discussion, pt does not use canned/convenience foods often. Pt does not add salt to food. Pt does not eat out frequently.   Pt recently saw a dietitian for CKD. Pt with many resources to choose foods and a simple meal plan. Reviewed information with pt and discussed questions. Reinforced low sodium and low potassium.   Pt expressed understanding of the information reviewed.   Nutrition Diagnosis ? Food-and nutrition-related knowledge deficit related to lack of exposure to information as related to diagnosis of: ? CVD ?  CKD  Nutrition Intervention ? Pt's individual nutrition plan reviewed with  pt. ? Benefits of adopting Heart Healthy diet discussed when Medficts reviewed.   ? Pt given individualized low potassium food lists ? Continue client-centered nutrition education by RD, as part of interdisciplinary care.  Goal(s) ? Pt to identify and limit food sources of saturated fat, trans fat, refined carbohydrates and sodium ? Pt to learn to meal plan with low potassium and low sodium foods  Plan:   Will provide client-centered nutrition education as part of interdisciplinary care  Monitor and evaluate progress toward nutrition goal with team.   Michaele Offer, MS, RDN, LDN

## 2019-11-03 ENCOUNTER — Encounter (HOSPITAL_COMMUNITY)
Admission: RE | Admit: 2019-11-03 | Discharge: 2019-11-03 | Disposition: A | Discharge status: Home / Self Care | Payer: Medicare Other | Source: Ambulatory Visit | Attending: Cardiovascular Disease | Admitting: Cardiovascular Disease

## 2019-11-03 ENCOUNTER — Other Ambulatory Visit: Payer: Self-pay

## 2019-11-03 DIAGNOSIS — I214 Non-ST elevation (NSTEMI) myocardial infarction: Secondary | ICD-10-CM

## 2019-11-04 ENCOUNTER — Ambulatory Visit: Payer: Medicare Other | Admitting: Vascular Surgery

## 2019-11-04 NOTE — Progress Notes (Signed)
Cardiac Individual Treatment Plan  Patient Details  Name: Shelly [name] MRN: [id number] Date of Birth: 24-Nov-1941 Referring Provider:     CARDIAC REHAB PHASE II ORIENTATION from 10/21/2019 in [place]  Referring Provider  Dr. Cathie Olden      Initial Encounter Date:    CARDIAC REHAB PHASE II ORIENTATION from 10/21/2019 in [place]  Date  10/21/19      Visit Diagnosis: NSTEMI (non-ST elevated myocardial infarction) [facility])  Patient's Home Medications on Admission:  Current Outpatient Medications:  .  aspirin EC 81 MG tablet, Take 81 mg by mouth daily., Disp: , Rfl:  .  Biotin 1 MG CAPS, Take 1 mg by mouth daily. , Disp: , Rfl:  .  buPROPion (WELLBUTRIN XL) 150 MG 24 hr tablet, Take 150 mg by mouth every morning. , Disp: , Rfl:  .  carvedilol (COREG) 12.5 MG tablet, Take 1 tablet (12.5 mg total) by mouth 2 (two) times daily with a meal., Disp: 180 tablet, Rfl: 3 .  cetirizine (ZYRTEC) 10 MG tablet, Take 10 mg by mouth daily., Disp: , Rfl:  .  Cholecalciferol (VITAMIN D-3) 25 MCG (1000 UT) CAPS, Take 1,000 Units by mouth daily., Disp: , Rfl:  .  furosemide (LASIX) 20 MG tablet, Take 20 mg by mouth daily., Disp: , Rfl:  .  hydrALAZINE (APRESOLINE) 25 MG tablet, Take 1.5 tablets (37.5 mg total) by mouth 2 (two) times daily., Disp: 180 tablet, Rfl: 3 .  isosorbide mononitrate (IMDUR) 30 MG 24 hr tablet, Take 1 tablet (30 mg total) by mouth daily., Disp: 90 tablet, Rfl: 3 .  Multiple Vitamins-Minerals (PRESERVISION AREDS PO), Take by mouth., Disp: , Rfl:  .  Multiple Vitamins-Minerals (PRESERVISION/LUTEIN) CAPS, Take 1 capsule by mouth every morning. , Disp: , Rfl:  .  rosuvastatin (CRESTOR) 20 MG tablet, Take 1 tablet (20 mg total) by mouth daily at 6 PM., Disp: 90 tablet, Rfl: 3 .  SYNTHROID 50 MCG tablet, Take 50 mcg by mouth daily before breakfast. Take one tablet (50 mcg)  by mouth Saturday and Sunday before breakfast. Take  1.5 tablets (75 mcg) M-F., Disp: , Rfl:   Past Medical History: Past Medical History:  Diagnosis Date  . Anxiety   . Arthritis    right elbow  . 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 Breast cancer ([place]) 03/2010   left breast s/p lumpectomy; adjuvant radiation; on adjuvant homrnal therapy  . Chronic [name] disease    stage 3  . COPD (chronic obstructive pulmonary disease) ([place])   . Hay fever   . History of breast cancer 09/29/2012  . Hypertension   . Hypothyroidism   . Macular degeneration   . Migraine   . MVP (mitral valve prolapse)   . Osteopenia   . PONV (postoperative nausea and vomiting)   . Right arm fracture   . Sciatic pain   . Scoliosis     Tobacco Use: Social History   Tobacco Use  Smoking Status Former Smoker  . Packs/day: 0.50  . Years: 25.00  . Pack years: 12.50  . Quit date: 08/07/1985  . Years since quitting: 34.2  Smokeless Tobacco Never Used    Labs: Recent Chemical engineer    Labs for ITP Cardiac and Pulmonary Rehab Latest Ref Rng & Units 09/05/2019   Cholestrol 0 - 200 mg/dL 164   LDLCALC 0 - 99 mg/dL 75   HDL >40 mg/dL 78  Trlycerides <150 mg/dL 57      Capillary Blood Glucose: No results found for: GLUCAP   Exercise Target Goals: Exercise Program Goal: Individual exercise prescription set using results from initial 6 min walk test and THRR while considering  patient's activity barriers and safety.   Exercise Prescription Goal: Starting with aerobic activity 30 plus minutes a day, 3 days per week for initial exercise prescription. Provide home exercise prescription and guidelines that participant acknowledges understanding prior to discharge.  Activity Barriers & Risk Stratification: Activity Barriers & Cardiac Risk Stratification - 10/21/19 1356      Activity Barriers & Cardiac Risk Stratification   Activity Barriers  Arthritis;Back Problems;Other (comment)    Comments  PAD    Cardiac Risk Stratification  High        6 Minute Walk: 6 Minute Walk    Row Name 10/21/19 1355         6 Minute Walk   Phase  Initial     Distance  1188 feet     Walk Time  6 minutes     # of Rest Breaks  0     MPH  2.25     METS  2.41     RPE  12     Perceived Dyspnea   0     VO2 Peak  8.44     Symptoms  No     Resting HR  75 bpm     Resting BP  102/56     Resting Oxygen Saturation   96 %     Exercise Oxygen Saturation  during 6 min walk  97 %     Max Ex. HR  99 bpm     Max Ex. BP  124/62     2 Minute Post BP  102/56        Oxygen Initial Assessment:   Oxygen Re-Evaluation:   Oxygen Discharge (Final Oxygen Re-Evaluation):   Initial Exercise Prescription: Initial Exercise Prescription - 10/21/19 1400      Date of Initial Exercise RX and Referring Provider   Date  10/21/19    Referring Provider  Dr. Cathie Olden    Expected Discharge Date  12/15/19      Treadmill   MPH  1.7    Grade  0    Minutes  15      NuStep   Level  2    SPM  75    Minutes  15    METs  2      Prescription Details   Frequency (times per week)  3    Duration  Progress to 30 minutes of continuous aerobic without signs/symptoms of physical distress      Intensity   THRR 40-80% of Max Heartrate  56-112    Ratings of Perceived Exertion  11-13      Progression   Progression  Continue to progress workloads to maintain intensity without signs/symptoms of physical distress.      Resistance Training   Training Prescription  Yes    Weight  2 lbs.     Reps  10-15       Perform Capillary Blood Glucose checks as needed.  Exercise Prescription Changes: Exercise Prescription Changes    Row Name 10/25/19 1600 11/01/19 1500 11/03/19 1505         Response to Exercise   Blood Pressure (Admit)  118/70  110/60  108/50     Blood Pressure (Exercise)  138/48  150/58  118/50  Blood Pressure (Exit)  100/40  122/58  114/70     Heart Rate (Admit)  86 bpm  85 bpm  76 bpm     Heart Rate (Exercise)  92 bpm  93 bpm  97 bpm      Heart Rate (Exit)  86 bpm  82 bpm  76 bpm     Rating of Perceived Exertion (Exercise)  13  13  13      Symptoms  Leg fatigue/weakness.  none  none     Comments  Pt moved from TM to RB due to leg fatigue and unsteadiness on TM. Tollerated RB better.  Pt saw chiropractor yesterday for back pain. Cleared to exercise but with no resistance.  Pt returned tolerated low intensity exercise well without back pain.     Duration  Progress to 30 minutes of  aerobic without signs/symptoms of physical distress  Progress to 30 minutes of  aerobic without signs/symptoms of physical distress  Progress to 30 minutes of  aerobic without signs/symptoms of physical distress     Intensity  THRR unchanged  THRR unchanged  THRR unchanged       Progression   Progression  Continue to progress workloads to maintain intensity without signs/symptoms of physical distress.  Continue to progress workloads to maintain intensity without signs/symptoms of physical distress.  Continue to progress workloads to maintain intensity without signs/symptoms of physical distress.     Average METs  1.8  1.3  2       Resistance Training   Training Prescription  Yes  No  No Relaxation day, no weights.     Weight  2 lbs.   -  -     Reps  10-15  -  -     Time  10 Minutes  -  -       Interval Training   Interval Training  No  No  No       Treadmill   MPH  -  -  -     Grade  -  -  -     Minutes  -  -  -       Recumbant Bike   Level  1  -  1     Watts  -  -  9     Minutes  15  -  10     METs  1.9  -  2.57       NuStep   Level  2  1  2      SPM  75  65  46     Minutes  15  26  20      METs  1.7  1.3  1.5        Exercise Comments: Exercise Comments    Row Name 10/25/19 1613 11/01/19 1500 11/03/19 1520       Exercise Comments  Patient tolerated 1st session of exercise fair with some c/o leg weakness. Switched patient from treadmill to recumbent bike due leg weakness and unsteadiness on the TM.  Patient states she saw chiropractor  yesterday for back pain and received treatment. Pt states she was cleared to exercise but without resistance. Decreased workload on recumbent stepper to level 1.0 and no hand weights today.  Reviewed METs and goals with patient.        Exercise Goals and Review: Exercise Goals    Row Name 10/21/19 [phone]  Exercise Goals   Increase Physical Activity  Yes       Intervention  Provide advice, education, support and counseling about physical activity/exercise needs.;Develop an individualized exercise prescription for aerobic and resistive training based on initial evaluation findings, risk stratification, comorbidities and participant's personal goals.       Expected Outcomes  Short Term: Attend rehab on a regular basis to increase amount of physical activity.;Long Term: Add in home exercise to make exercise part of routine and to increase amount of physical activity.;Long Term: Exercising regularly at least 3-5 days a week.       Increase Strength and Stamina  Yes       Intervention  Provide advice, education, support and counseling about physical activity/exercise needs.;Develop an individualized exercise prescription for aerobic and resistive training based on initial evaluation findings, risk stratification, comorbidities and participant's personal goals.       Expected Outcomes  Short Term: Increase workloads from initial exercise prescription for resistance, speed, and METs.;Short Term: Perform resistance training exercises routinely during rehab and add in resistance training at home;Long Term: Improve cardiorespiratory fitness, muscular endurance and strength as measured by increased METs and functional capacity (6MWT)       Able to understand and use rate of perceived exertion (RPE) scale  Yes       Intervention  Provide education and explanation on how to use RPE scale       Expected Outcomes  Short Term: Able to use RPE daily in rehab to express subjective intensity level;Long  Term:  Able to use RPE to guide intensity level when exercising independently       Knowledge and understanding of Target Heart Rate Range (THRR)  Yes       Intervention  Provide education and explanation of THRR including how the numbers were predicted and where they are located for reference       Expected Outcomes  Short Term: Able to state/look up THRR;Long Term: Able to use THRR to govern intensity when exercising independently;Short Term: Able to use daily as guideline for intensity in rehab       Able to check pulse independently  Yes       Intervention  Provide education and demonstration on how to check pulse in carotid and radial arteries.;Review the importance of being able to check your own pulse for safety during independent exercise       Expected Outcomes  Short Term: Able to explain why pulse checking is important during independent exercise;Long Term: Able to check pulse independently and accurately       Understanding of Exercise Prescription  Yes       Intervention  Provide education, explanation, and written materials on patient's individual exercise prescription       Expected Outcomes  Short Term: Able to explain program exercise prescription;Long Term: Able to explain home exercise prescription to exercise independently       Improve claudication pain toleration; Improve walking ability  Yes       Intervention  Participate in PAD/SET Rehab 2-3 days a week, walking at home as part of exercise prescription;Attend education sessions to aid in risk factor modification and understanding of disease process       Expected Outcomes  Short Term: Improve walking distance/time to onset of claudication pain;Long Term: Improve score of PAD questionnaires;Long Term: Improve walking ability and toleration to claudication          Exercise Goals Re-Evaluation : Exercise Goals Re-Evaluation  [place] Name 10/25/19 1613 11/03/19 1520           Exercise Goal Re-Evaluation   Exercise Goals  Review  Able to understand and use rate of perceived exertion (RPE) scale;Increase Physical Activity;Increase Strength and Stamina  Able to understand and use rate of perceived exertion (RPE) scale;Increase Physical Activity;Increase Strength and Stamina      Comments  Patient able to understand and use RPE scale appropriately. Pt c/o leg weakness moved from treadmill to recumbent bike. Discussed increasing walking at home from 15 minutes to 30 minutes by adding 1-2 minutes each session until goal is achieved, and pt is amenable to this. Will hold walking on TM at cardiac rehab until legs are strong and pt more steady walking on treadmill.  Patient tolerated low intensity exercise well without back pain. Encouraged pt to increase steps/min on the Nustep and pt is amenable to this. Pt is walking some when walking her dogs. pt's goal is to increase her strength.      Expected Outcomes  Progress workloads as tolerated to help build leg strength and increase stamina.  Continue to increase workloads as tolerated.          Discharge Exercise Prescription (Final Exercise Prescription Changes): Exercise Prescription Changes - 11/03/19 1505      Response to Exercise   Blood Pressure (Admit)  108/50    Blood Pressure (Exercise)  118/50    Blood Pressure (Exit)  114/70    Heart Rate (Admit)  76 bpm    Heart Rate (Exercise)  97 bpm    Heart Rate (Exit)  76 bpm    Rating of Perceived Exertion (Exercise)  13    Symptoms  none    Comments  Pt returned tolerated low intensity exercise well without back pain.    Duration  Progress to 30 minutes of  aerobic without signs/symptoms of physical distress    Intensity  THRR unchanged      Progression   Progression  Continue to progress workloads to maintain intensity without signs/symptoms of physical distress.    Average METs  2      Resistance Training   Training Prescription  No   Relaxation day, no weights.     Interval Training   Interval Training  No       Recumbant Bike   Level  1    Watts  9    Minutes  10    METs  2.57      NuStep   Level  2    SPM  46    Minutes  20    METs  1.5       Nutrition:  Target Goals: Understanding of nutrition guidelines, daily intake of sodium 1500mg , cholesterol 200mg , calories 30% from fat and 7% or less from saturated fats, daily to have 5 or more servings of fruits and vegetables.  Biometrics: Pre Biometrics - 10/21/19 1357      Pre Biometrics   Height  5' 2.25" (1.581 m)    Weight  48.5 kg    Waist Circumference  28 inches    Hip Circumference  35.5 inches    Waist to Hip Ratio  0.79 %    BMI (Calculated)  19.4    Triceps Skinfold  14 mm    % Body Fat  29.8 %    Grip Strength  22 kg    Flexibility  11 in    Single Leg Stand  16.18 seconds  Nutrition Therapy Plan and Nutrition Goals: Nutrition Therapy & Goals - 11/02/19 0738      Nutrition Therapy   Diet  Heart Healthy/CKD      Personal Nutrition Goals   Nutrition Goal  Pt to identify and limit food sources of saturated fat, trans fat, refined carbohydrates and sodium    Personal Goal #2  Pt to learn to meal plan with low potassium and low sodium foods      Intervention Plan   Intervention  Prescribe, educate and counsel regarding individualized specific dietary modifications aiming towards targeted core components such as weight, hypertension, lipid management, diabetes, heart failure and other comorbidities.;Nutrition handout(s) given to patient.    Expected Outcomes  Long Term Goal: Adherence to prescribed nutrition plan.;Short Term Goal: A plan [name] been developed with personal nutrition goals set during dietitian appointment.       Nutrition Assessments: Nutrition Assessments - 11/02/19 0746      MEDFICTS Scores   Pre Score  26       Nutrition Goals Re-Evaluation: Nutrition Goals Re-Evaluation    Row Name 11/02/19 0745             Goals   Current Weight  105 lb (47.6 kg)       Nutrition Goal  Pt  to identify and limit food sources of saturated fat, trans fat, refined carbohydrates and sodium         Personal Goal #2 Re-Evaluation   Personal Goal #2  Pt to learn to meal plan with low potassium and low sodium foods          Nutrition Goals Discharge (Final Nutrition Goals Re-Evaluation): Nutrition Goals Re-Evaluation - 11/02/19 0745      Goals   Current Weight  105 lb (47.6 kg)    Nutrition Goal  Pt to identify and limit food sources of saturated fat, trans fat, refined carbohydrates and sodium      Personal Goal #2 Re-Evaluation   Personal Goal #2  Pt to learn to meal plan with low potassium and low sodium foods       Psychosocial: Target Goals: Acknowledge presence or absence of significant depression and/or stress, maximize coping skills, provide positive support system. Participant is able to verbalize types and ability to use techniques and skills needed for reducing stress and depression.  Initial Review & Psychosocial Screening: Initial Psych Review & Screening - 10/21/19 1426      Initial Review   Current issues with  History of Depression;Current Anxiety/Panic;Current Stress Concerns    Source of Stress Concerns  Chronic Illness;Family;Unable to participate in former interests or hobbies    Comments  Shelly [name] reports some stress and axiety r/t her husband's health and her own. She [name] a hisotry of depression but does not express any depression currently.  She feels that she could do better with managing her stress. She enjoys art classes and reports that she feels she would have more time to participate in this hobby now that she [name] sold her second home.      Family Dynamics   Good Support System?  Yes   Shelly [name] states that her husband and children are sources of support for her.     Barriers   Psychosocial barriers to participate in program  The patient should benefit from training in stress management and relaxation.      Screening Interventions   Interventions   Encouraged to exercise;To provide support and resources with identified psychosocial needs  Quality of Life Scores: Quality of Life - 10/21/19 1529      Quality of Life   Select  Quality of Life      Quality of Life Scores   Health/Function Pre  15.93 %    Socioeconomic Pre  18.17 %    Psych/Spiritual Pre  17.79 %    Family Pre  20.4 %    GLOBAL Pre  17.41 %      Scores of 19 and below usually indicate a poorer quality of life in these areas.  A difference of  2-3 points is a clinically meaningful difference.  A difference of 2-3 points in the total score of the Quality of Life Index [name] been associated with significant improvement in overall quality of life, self-image, physical symptoms, and general health in studies assessing change in quality of life.  PHQ-9: Recent Review Flowsheet Data    Depression screen [facility] 2/9 10/22/2019 10/22/2019 10/21/2019   Decreased Interest 0 0 0   Down, Depressed, Hopeless 0 0 0   PHQ - 2 Score 0 0 0     Interpretation of Total Score  Total Score Depression Severity:  1-4 = Minimal depression, 5-9 = Mild depression, 10-14 = Moderate depression, 15-19 = Moderately severe depression, 20-27 = Severe depression   Psychosocial Evaluation and Intervention:   Psychosocial Re-Evaluation: Psychosocial Re-Evaluation    [place] Name 11/04/19 1039             Psychosocial Re-Evaluation   Current issues with  Current Anxiety/Panic;History of Depression;Current Stress Concerns       Comments  Shelly [name] remains concerned about her husband's health. Will continue to provide emotional support as needed.       Interventions  Stress management education;Encouraged to attend Cardiac Rehabilitation for the exercise       Continue Psychosocial Services   Follow up required by staff       Comments  Shelly [name] reports some stress and axiety r/t her husband's health and her own. She [name] a hisotry of depression but does not express any depression currently. Will continue  to offer emotional support as needed         Initial Review   Source of Stress Concerns  Family          Psychosocial Discharge (Final Psychosocial Re-Evaluation): Psychosocial Re-Evaluation - 11/04/19 1039      Psychosocial Re-Evaluation   Current issues with  Current Anxiety/Panic;History of Depression;Current Stress Concerns    Comments  Shelly [name] remains concerned about her husband's health. Will continue to provide emotional support as needed.    Interventions  Stress management education;Encouraged to attend Cardiac Rehabilitation for the exercise    Continue Psychosocial Services   Follow up required by staff    Comments  Shelly [name] reports some stress and axiety r/t her husband's health and her own. She [name] a hisotry of depression but does not express any depression currently. Will continue to offer emotional support as needed      Initial Review   Source of Stress Concerns  Family       Vocational Rehabilitation: Provide vocational rehab assistance to qualifying candidates.   Vocational Rehab Evaluation & Intervention: Vocational Rehab - 10/21/19 1427      Initial Vocational Rehab Evaluation & Intervention   Assessment shows need for Vocational Rehabilitation  No       Education: Education Goals: Education classes will be provided on a weekly basis, covering required topics. Participant will state understanding/return demonstration of topics  presented.  Learning Barriers/Preferences: Learning Barriers/Preferences - 10/21/19 1432      Learning Barriers/Preferences   Learning Barriers  Sight    Learning Preferences  Written Material;Pictoral       Education Topics: Hypertension, Hypertension Reduction -Define heart disease and high blood pressure. Discus how high blood pressure affects the body and ways to reduce high blood pressure.   Exercise and Your Heart -Discuss why it is important to exercise, the FITT principles of exercise, normal and abnormal responses to  exercise, and how to exercise safely.   Angina -Discuss definition of angina, causes of angina, treatment of angina, and how to decrease risk of having angina.   Cardiac Medications -Review what the following cardiac medications are used for, how they affect the body, and side effects that may occur when taking the medications.  Medications include Aspirin, Beta blockers, calcium channel blockers, ACE Inhibitors, angiotensin receptor blockers, diuretics, digoxin, and antihyperlipidemics.   Congestive Heart Failure -Discuss the definition of CHF, how to live with CHF, the signs and symptoms of CHF, and how keep track of weight and sodium intake.   Heart Disease and Intimacy -Discus the effect sexual activity [name] on the heart, how changes occur during intimacy as we age, and safety during sexual activity.   Smoking Cessation / COPD -Discuss different methods to quit smoking, the health benefits of quitting smoking, and the definition of COPD.   Nutrition I: Fats -Discuss the types of cholesterol, what cholesterol does to the heart, and how cholesterol levels can be controlled.   Nutrition II: Labels -Discuss the different components of food labels and how to read food label   Heart Parts/Heart Disease and PAD -Discuss the anatomy of the heart, the pathway of blood circulation through the heart, and these are affected by heart disease.   Stress I: Signs and Symptoms -Discuss the causes of stress, how stress may lead to anxiety and depression, and ways to limit stress.   Stress II: Relaxation -Discuss different types of relaxation techniques to limit stress.   Warning Signs of Stroke / TIA -Discuss definition of a stroke, what the signs and symptoms are of a stroke, and how to identify when someone is having stroke.   Knowledge Questionnaire Score: Knowledge Questionnaire Score - 10/21/19 1432      Knowledge Questionnaire Score   Pre Score  22/24       Core  Components/Risk Factors/Patient Goals at Admission: Personal Goals and Risk Factors at Admission - 10/21/19 1411      Core Components/Risk Factors/Patient Goals on Admission    Weight Management  Yes;Weight Maintenance;Weight Gain    Intervention  Weight Management: Develop a combined nutrition and exercise program designed to reach desired caloric intake, while maintaining appropriate intake of nutrient and fiber, sodium and fats, and appropriate energy expenditure required for the weight goal.;Weight Management: Provide education and appropriate resources to help participant work on and attain dietary goals.    Admit Weight  106 lb 14.8 oz (48.5 kg)    Expected Outcomes  Short Term: Continue to assess and modify interventions until short term weight is achieved;Long Term: Adherence to nutrition and physical activity/exercise program aimed toward attainment of established weight goal;Weight Maintenance: Understanding of the daily nutrition guidelines, which includes 25-35% calories from fat, 7% or less cal from saturated fats, less than 200mg  cholesterol, less than 1.5gm of sodium, & 5 or more servings of fruits and vegetables daily;Understanding recommendations for meals to include 15-35% energy as protein, 25-35% energy  from fat, 35-60% energy from carbohydrates, less than 200mg  of dietary cholesterol, 20-35 gm of total fiber daily;Understanding of distribution of calorie intake throughout the day with the consumption of 4-5 meals/snacks;Weight Gain: Understanding of general recommendations for a high calorie, high protein meal plan that promotes weight gain by distributing calorie intake throughout the day with the consumption for 4-5 meals, snacks, and/or supplements    Hypertension  Yes    Intervention  Provide education on lifestyle modifcations including regular physical activity/exercise, weight management, moderate sodium restriction and increased consumption of fresh fruit, vegetables, and low  fat dairy, alcohol moderation, and smoking cessation.;Monitor prescription use compliance.    Expected Outcomes  Short Term: Continued assessment and intervention until BP is < 140/54mm HG in hypertensive participants. < 130/15mm HG in hypertensive participants with diabetes, heart failure or chronic [name] disease.;Long Term: Maintenance of blood pressure at goal levels.    Lipids  Yes    Intervention  Provide education and support for participant on nutrition & aerobic/resistive exercise along with prescribed medications to achieve LDL 70mg , HDL >40mg .    Expected Outcomes  Short Term: Participant states understanding of desired cholesterol values and is compliant with medications prescribed. Participant is following exercise prescription and nutrition guidelines.;Long Term: Cholesterol controlled with medications as prescribed, with individualized exercise RX and with personalized nutrition plan. Value goals: LDL < 70mg , HDL > 40 mg.       Core Components/Risk Factors/Patient Goals Review:  Goals and Risk Factor Review    Row Name 11/04/19 1044             Core Components/Risk Factors/Patient Goals Review   Personal Goals Review  Weight Management/Obesity;Hypertension;Lipids       Review  Shelly [name] is off to a good start to exercise. Shelly [name]'s vital signs have been stable. Shelly [name] had some issues with chronic back pain which she sees a Special educational needs teacher.       Expected Outcomes  Shelly [name] will continue to particpate in phase 2 cardiac rehab for exercise, nutrition and lifestyle modificatons.          Core Components/Risk Factors/Patient Goals at Discharge (Final Review):  Goals and Risk Factor Review - 11/04/19 1044      Core Components/Risk Factors/Patient Goals Review   Personal Goals Review  Weight Management/Obesity;Hypertension;Lipids    Review  Shelly [name] is off to a good start to exercise. Shelly [name]'s vital signs have been stable. Shelly [name] [name] had some issues with chronic back pain which she sees a  Special educational needs teacher.    Expected Outcomes  Shelly [name] will continue to particpate in phase 2 cardiac rehab for exercise, nutrition and lifestyle modificatons.       ITP Comments: ITP Comments    Row Name 10/21/19 1328 11/04/19 1038         ITP Comments  Dr. Fransico Him, Medical Director  30 Day ITP Review. Digna is off to a good start to exercise at cardiac rehab.         Comments: See ITP comments.Barnet Pall, RN,BSN 11/04/2019 11:23 AM

## 2019-11-05 ENCOUNTER — Encounter (HOSPITAL_COMMUNITY)
Admission: RE | Admit: 2019-11-05 | Discharge: 2019-11-05 | Disposition: A | Discharge status: Home / Self Care | Payer: Medicare Other | Source: Ambulatory Visit | Attending: Cardiovascular Disease | Admitting: Cardiovascular Disease

## 2019-11-05 ENCOUNTER — Other Ambulatory Visit: Payer: Self-pay

## 2019-11-05 DIAGNOSIS — I214 Non-ST elevation (NSTEMI) myocardial infarction: Secondary | ICD-10-CM

## 2019-11-08 ENCOUNTER — Encounter (HOSPITAL_COMMUNITY): Payer: Medicare Other

## 2019-11-10 ENCOUNTER — Encounter (HOSPITAL_COMMUNITY): Payer: Medicare Other

## 2019-11-12 ENCOUNTER — Other Ambulatory Visit: Payer: Self-pay

## 2019-11-12 ENCOUNTER — Encounter (HOSPITAL_COMMUNITY)
Admission: RE | Admit: 2019-11-12 | Discharge: 2019-11-12 | Disposition: A | Discharge status: Home / Self Care | Payer: Medicare Other | Source: Ambulatory Visit | Attending: Cardiovascular Disease | Admitting: Cardiovascular Disease

## 2019-11-12 DIAGNOSIS — I214 Non-ST elevation (NSTEMI) myocardial infarction: Secondary | ICD-10-CM | POA: Diagnosis not present

## 2019-11-15 ENCOUNTER — Encounter (HOSPITAL_COMMUNITY)
Admission: RE | Admit: 2019-11-15 | Discharge: 2019-11-15 | Disposition: A | Discharge status: Home / Self Care | Payer: Medicare Other | Source: Ambulatory Visit | Attending: Cardiovascular Disease | Admitting: Cardiovascular Disease

## 2019-11-15 ENCOUNTER — Other Ambulatory Visit: Payer: Self-pay

## 2019-11-15 DIAGNOSIS — I214 Non-ST elevation (NSTEMI) myocardial infarction: Secondary | ICD-10-CM | POA: Diagnosis not present

## 2019-11-17 ENCOUNTER — Encounter (HOSPITAL_COMMUNITY)
Admission: RE | Admit: 2019-11-17 | Discharge: 2019-11-17 | Disposition: A | Discharge status: Home / Self Care | Payer: Medicare Other | Source: Ambulatory Visit | Attending: Cardiovascular Disease | Admitting: Cardiovascular Disease

## 2019-11-17 ENCOUNTER — Other Ambulatory Visit: Payer: Self-pay

## 2019-11-17 DIAGNOSIS — I214 Non-ST elevation (NSTEMI) myocardial infarction: Secondary | ICD-10-CM

## 2019-11-19 ENCOUNTER — Encounter (HOSPITAL_COMMUNITY): Payer: Medicare Other

## 2019-11-22 ENCOUNTER — Other Ambulatory Visit: Payer: Self-pay

## 2019-11-22 ENCOUNTER — Encounter (HOSPITAL_COMMUNITY)
Admission: RE | Admit: 2019-11-22 | Discharge: 2019-11-22 | Disposition: A | Discharge status: Home / Self Care | Payer: Medicare Other | Source: Ambulatory Visit | Attending: Cardiovascular Disease | Admitting: Cardiovascular Disease

## 2019-11-22 DIAGNOSIS — I214 Non-ST elevation (NSTEMI) myocardial infarction: Secondary | ICD-10-CM | POA: Diagnosis not present

## 2019-11-24 ENCOUNTER — Other Ambulatory Visit: Payer: Self-pay

## 2019-11-24 ENCOUNTER — Encounter (HOSPITAL_COMMUNITY)
Admission: RE | Admit: 2019-11-24 | Discharge: 2019-11-24 | Disposition: A | Discharge status: Home / Self Care | Payer: Medicare Other | Source: Ambulatory Visit | Attending: Cardiovascular Disease | Admitting: Cardiovascular Disease

## 2019-11-24 DIAGNOSIS — I214 Non-ST elevation (NSTEMI) myocardial infarction: Secondary | ICD-10-CM | POA: Diagnosis not present

## 2019-11-26 ENCOUNTER — Other Ambulatory Visit: Payer: Self-pay

## 2019-11-26 ENCOUNTER — Encounter (HOSPITAL_COMMUNITY)
Admission: RE | Admit: 2019-11-26 | Discharge: 2019-11-26 | Disposition: A | Discharge status: Home / Self Care | Payer: Medicare Other | Source: Ambulatory Visit | Attending: Cardiovascular Disease | Admitting: Cardiovascular Disease

## 2019-11-26 DIAGNOSIS — I214 Non-ST elevation (NSTEMI) myocardial infarction: Secondary | ICD-10-CM

## 2019-11-29 ENCOUNTER — Encounter (HOSPITAL_COMMUNITY): Payer: Medicare Other

## 2019-12-01 ENCOUNTER — Other Ambulatory Visit: Payer: Self-pay

## 2019-12-01 ENCOUNTER — Encounter (HOSPITAL_COMMUNITY)
Admission: RE | Admit: 2019-12-01 | Discharge: 2019-12-01 | Disposition: A | Discharge status: Home / Self Care | Payer: Medicare Other | Source: Ambulatory Visit | Attending: Cardiovascular Disease | Admitting: Cardiovascular Disease

## 2019-12-01 DIAGNOSIS — I214 Non-ST elevation (NSTEMI) myocardial infarction: Secondary | ICD-10-CM

## 2019-12-01 NOTE — Progress Notes (Signed)
   12/01/19 1530  Exercise Goal Re-Evaluation  Exercise Goals Review Able to understand and use rate of perceived exertion (RPE) scale;Increase Physical Activity;Increase Strength and Stamina;Able to check pulse independently;Knowledge and understanding of Target Heart Rate Range (THRR);Understanding of Exercise Prescription  Comments Reviewed home exercise guidelines with patient including THRR, RPE scale, and endpoints for exercise. Walking is limited by right leg fatigue, which patient states is a little better now. Pt states that she's walking 15 minutes most days of the week. Encouraged patient to increase walking duration or days/week to achieve 150 minutes/week, and patient is amenable to this. Patient knows how to manually count her pulse. Will increase workloads and hand weights next week. Pt's goal is to get back to walking like she used to.  Expected Outcomes Patient will increase walking duration at home in addition to exercise at cardiac rehab to help build walking tolerance.

## 2019-12-02 NOTE — Progress Notes (Signed)
Cardiac Individual Treatment Plan  Patient Details  Name: Shelly [name] MRN: [id number] Date of Birth: 12-Aug-1941 Referring Provider:     CARDIAC REHAB PHASE II ORIENTATION from 10/21/2019 in [place]  Referring Provider  Dr. Cathie Olden      Initial Encounter Date:    CARDIAC REHAB PHASE II ORIENTATION from 10/21/2019 in [place]  Date  10/21/19      Visit Diagnosis: NSTEMI (non-ST elevated myocardial infarction) [facility])  Patient's Home Medications on Admission:  Current Outpatient Medications:  .  aspirin EC 81 MG tablet, Take 81 mg by mouth daily., Disp: , Rfl:  .  Biotin 1 MG CAPS, Take 1 mg by mouth daily. , Disp: , Rfl:  .  buPROPion (WELLBUTRIN XL) 150 MG 24 hr tablet, Take 150 mg by mouth every morning. , Disp: , Rfl:  .  carvedilol (COREG) 12.5 MG tablet, Take 1 tablet (12.5 mg total) by mouth 2 (two) times daily with a meal., Disp: 180 tablet, Rfl: 3 .  cetirizine (ZYRTEC) 10 MG tablet, Take 10 mg by mouth daily., Disp: , Rfl:  .  Cholecalciferol (VITAMIN D-3) 25 MCG (1000 UT) CAPS, Take 1,000 Units by mouth daily., Disp: , Rfl:  .  furosemide (LASIX) 20 MG tablet, Take 20 mg by mouth daily., Disp: , Rfl:  .  hydrALAZINE (APRESOLINE) 25 MG tablet, Take 1.5 tablets (37.5 mg total) by mouth 2 (two) times daily., Disp: 180 tablet, Rfl: 3 .  isosorbide mononitrate (IMDUR) 30 MG 24 hr tablet, Take 1 tablet (30 mg total) by mouth daily., Disp: 90 tablet, Rfl: 3 .  Multiple Vitamins-Minerals (PRESERVISION AREDS PO), Take by mouth., Disp: , Rfl:  .  Multiple Vitamins-Minerals (PRESERVISION/LUTEIN) CAPS, Take 1 capsule by mouth every morning. , Disp: , Rfl:  .  rosuvastatin (CRESTOR) 20 MG tablet, Take 1 tablet (20 mg total) by mouth daily at 6 PM., Disp: 90 tablet, Rfl: 3 .  SYNTHROID 50 MCG tablet, Take 50 mcg by mouth daily before breakfast. Take one tablet (50 mcg)  by mouth Saturday and Sunday before breakfast. Take  1.5 tablets (75 mcg) M-F., Disp: , Rfl:   Past Medical History: Past Medical History:  Diagnosis Date  . Anxiety   . Arthritis    right elbow  . 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 Breast cancer ([place]) 03/2010   left breast s/p lumpectomy; adjuvant radiation; on adjuvant homrnal therapy  . Chronic kidney disease    stage 3  . COPD (chronic obstructive pulmonary disease) ([name])   . Hay fever   . History of breast cancer 09/29/2012  . Hypertension   . Hypothyroidism   . Macular degeneration   . Migraine   . MVP (mitral valve prolapse)   . Osteopenia   . PONV (postoperative nausea and vomiting)   . Right arm fracture   . Sciatic pain   . Scoliosis     Tobacco Use: Social History   Tobacco Use  Smoking Status Former Smoker  . Packs/day: 0.50  . Years: 25.00  . Pack years: 12.50  . Quit date: 08/07/1985  . Years since quitting: 34.3  Smokeless Tobacco Never Used    Labs: Recent Chemical engineer    Labs for ITP Cardiac and Pulmonary Rehab Latest Ref Rng & Units 09/05/2019   Cholestrol 0 - 200 mg/dL 164   LDLCALC 0 - 99 mg/dL 75   HDL >40 mg/dL 78  Trlycerides <150 mg/dL 57      Capillary Blood Glucose: No results found for: GLUCAP   Exercise Target Goals: Exercise Program Goal: Individual exercise prescription set using results from initial 6 min walk test and THRR while considering  patient's activity barriers and safety.   Exercise Prescription Goal: Starting with aerobic activity 30 plus minutes a day, 3 days per week for initial exercise prescription. Provide home exercise prescription and guidelines that participant acknowledges understanding prior to discharge.  Activity Barriers & Risk Stratification: Activity Barriers & Cardiac Risk Stratification - 10/21/19 1356      Activity Barriers & Cardiac Risk Stratification   Activity Barriers  Arthritis;Back Problems;Other (comment)    Comments  PAD    Cardiac Risk Stratification  High        6 Minute Walk: 6 Minute Walk    Row Name 10/21/19 1355         6 Minute Walk   Phase  Initial     Distance  1188 feet     Walk Time  6 minutes     # of Rest Breaks  0     MPH  2.25     METS  2.41     RPE  12     Perceived Dyspnea   0     VO2 Peak  8.44     Symptoms  No     Resting HR  75 bpm     Resting BP  102/56     Resting Oxygen Saturation   96 %     Exercise Oxygen Saturation  during 6 min walk  97 %     Max Ex. HR  99 bpm     Max Ex. BP  124/62     2 Minute Post BP  102/56        Oxygen Initial Assessment:   Oxygen Re-Evaluation:   Oxygen Discharge (Final Oxygen Re-Evaluation):   Initial Exercise Prescription: Initial Exercise Prescription - 10/21/19 1400      Date of Initial Exercise RX and Referring Provider   Date  10/21/19    Referring Provider  Dr. Cathie Olden    Expected Discharge Date  12/15/19      Treadmill   MPH  1.7    Grade  0    Minutes  15      NuStep   Level  2    SPM  75    Minutes  15    METs  2      Prescription Details   Frequency (times per week)  3    Duration  Progress to 30 minutes of continuous aerobic without signs/symptoms of physical distress      Intensity   THRR 40-80% of Max Heartrate  56-112    Ratings of Perceived Exertion  11-13      Progression   Progression  Continue to progress workloads to maintain intensity without signs/symptoms of physical distress.      Resistance Training   Training Prescription  Yes    Weight  2 lbs.     Reps  10-15       Perform Capillary Blood Glucose checks as needed.  Exercise Prescription Changes: Exercise Prescription Changes    Row Name 10/25/19 1600 11/01/19 1500 11/03/19 1505 11/15/19 1504 11/26/19 1504     Response to Exercise   Blood Pressure (Admit)  118/70  110/60  108/50  106/60  108/50   Blood Pressure (Exercise)  138/48  150/58  118/50  128/52  110/46   Blood Pressure (Exit)  100/40  122/58  114/70  138/52  114/70   Heart Rate (Admit)  86 bpm  85 bpm   76 bpm  80 bpm  80 bpm   Heart Rate (Exercise)  92 bpm  93 bpm  97 bpm  89 bpm  90 bpm   Heart Rate (Exit)  86 bpm  82 bpm  76 bpm  81 bpm  71 bpm   Rating of Perceived Exertion (Exercise)  13  13  13  13  11    Symptoms  Leg fatigue/weakness.  none  none  none  none   Comments  Pt moved from TM to RB due to leg fatigue and unsteadiness on TM. Tollerated RB better.  Pt saw chiropractor yesterday for back pain. Cleared to exercise but with no resistance.  Pt returned tolerated low intensity exercise well without back pain.  Oriented patient to the arm ergometer. Switched from recumbent bike to AE due to c/o leg fatigue.  -   Duration  Progress to 30 minutes of  aerobic without signs/symptoms of physical distress  Progress to 30 minutes of  aerobic without signs/symptoms of physical distress  Progress to 30 minutes of  aerobic without signs/symptoms of physical distress  Progress to 30 minutes of  aerobic without signs/symptoms of physical distress  Progress to 30 minutes of  aerobic without signs/symptoms of physical distress   Intensity  THRR unchanged  THRR unchanged  THRR unchanged  THRR unchanged  THRR unchanged     Progression   Progression  Continue to progress workloads to maintain intensity without signs/symptoms of physical distress.  Continue to progress workloads to maintain intensity without signs/symptoms of physical distress.  Continue to progress workloads to maintain intensity without signs/symptoms of physical distress.  Continue to progress workloads to maintain intensity without signs/symptoms of physical distress.  Continue to progress workloads to maintain intensity without signs/symptoms of physical distress.   Average METs  1.8  1.3  2  2   2.4     Resistance Training   Training Prescription  Yes  No  No Relaxation day, no weights.  Yes  Yes   Weight  2 lbs.   -  -  2 lbs.   2 lbs.    Reps  10-15  -  -  10-15  10-15   Time  10 Minutes  -  -  10 Minutes  10 Minutes     Interval  Training   Interval Training  No  No  No  No  No     Treadmill   MPH  -  -  -  -  -   Grade  -  -  -  -  -   Minutes  -  -  -  -  -     Recumbant Bike   Level  1  -  1  -  -   Watts  -  -  9  -  -   Minutes  15  -  10  -  -   METs  1.9  -  2.57  -  -     NuStep   Level  2  1  2  2  3    SPM  75  65  46  46  -   Minutes  15  26  20  20  15    METs  1.7  1.3  1.5  2  2.4     Arm Ergometer   Level  -  -  -  1.5  1   Minutes  -  -  -  15  15      Exercise Comments: Exercise Comments    Row Name 10/25/19 1613 11/01/19 1500 11/03/19 1520 11/17/19 1510 12/01/19 1530   Exercise Comments  Patient tolerated 1st session of exercise fair with some c/o leg weakness. Switched patient from treadmill to recumbent bike due leg weakness and unsteadiness on the TM.  Patient states she saw chiropractor yesterday for back pain and received treatment. Pt states she was cleared to exercise but without resistance. Decreased workload on recumbent stepper to level 1.0 and no hand weights today.  Reviewed METs and goals with patient.  Reviewed METs with patient.  Reviewed home exercise guidelines, METs and goals with patient.      Exercise Goals and Review: Exercise Goals    Row Name 10/21/19 1411             Exercise Goals   Increase Physical Activity  Yes       Intervention  Provide advice, education, support and counseling about physical activity/exercise needs.;Develop an individualized exercise prescription for aerobic and resistive training based on initial evaluation findings, risk stratification, comorbidities and participant's personal goals.       Expected Outcomes  Short Term: Attend rehab on a regular basis to increase amount of physical activity.;Long Term: Add in home exercise to make exercise part of routine and to increase amount of physical activity.;Long Term: Exercising regularly at least 3-5 days a week.       Increase Strength and Stamina  Yes       Intervention  Provide advice,  education, support and counseling about physical activity/exercise needs.;Develop an individualized exercise prescription for aerobic and resistive training based on initial evaluation findings, risk stratification, comorbidities and participant's personal goals.       Expected Outcomes  Short Term: Increase workloads from initial exercise prescription for resistance, speed, and METs.;Short Term: Perform resistance training exercises routinely during rehab and add in resistance training at home;Long Term: Improve cardiorespiratory fitness, muscular endurance and strength as measured by increased METs and functional capacity (6MWT)       Able to understand and use rate of perceived exertion (RPE) scale  Yes       Intervention  Provide education and explanation on how to use RPE scale       Expected Outcomes  Short Term: Able to use RPE daily in rehab to express subjective intensity level;Long Term:  Able to use RPE to guide intensity level when exercising independently       Knowledge and understanding of Target Heart Rate Range (THRR)  Yes       Intervention  Provide education and explanation of THRR including how the numbers were predicted and where they are located for reference       Expected Outcomes  Short Term: Able to state/look up THRR;Long Term: Able to use THRR to govern intensity when exercising independently;Short Term: Able to use daily as guideline for intensity in rehab       Able to check pulse independently  Yes       Intervention  Provide education and demonstration on how to check pulse in carotid and radial arteries.;Review the importance of being able to check your own pulse for safety during independent exercise       Expected Outcomes  Short Term: Able  to explain why pulse checking is important during independent exercise;Long Term: Able to check pulse independently and accurately       Understanding of Exercise Prescription  Yes       Intervention  Provide education, explanation,  and written materials on patient's individual exercise prescription       Expected Outcomes  Short Term: Able to explain program exercise prescription;Long Term: Able to explain home exercise prescription to exercise independently       Improve claudication pain toleration; Improve walking ability  Yes       Intervention  Participate in PAD/SET Rehab 2-3 days a week, walking at home as part of exercise prescription;Attend education sessions to aid in risk factor modification and understanding of disease process       Expected Outcomes  Short Term: Improve walking distance/time to onset of claudication pain;Long Term: Improve score of PAD questionnaires;Long Term: Improve walking ability and toleration to claudication          Exercise Goals Re-Evaluation : Exercise Goals Re-Evaluation    Row Name 10/25/19 1613 11/03/19 1520 12/01/19 1530         Exercise Goal Re-Evaluation   Exercise Goals Review  Able to understand and use rate of perceived exertion (RPE) scale;Increase Physical Activity;Increase Strength and Stamina  Able to understand and use rate of perceived exertion (RPE) scale;Increase Physical Activity;Increase Strength and Stamina  Able to understand and use rate of perceived exertion (RPE) scale;Increase Physical Activity;Increase Strength and Stamina;Able to check pulse independently;Knowledge and understanding of Target Heart Rate Range (THRR);Understanding of Exercise Prescription     Comments  Patient able to understand and use RPE scale appropriately. Pt c/o leg weakness moved from treadmill to recumbent bike. Discussed increasing walking at home from 15 minutes to 30 minutes by adding 1-2 minutes each session until goal is achieved, and pt is amenable to this. Will hold walking on TM at cardiac rehab until legs are strong and pt more steady walking on treadmill.  Patient tolerated low intensity exercise well without back pain. Encouraged pt to increase steps/min on the Nustep and pt  is amenable to this. Pt is walking some when walking her dogs. pt's goal is to increase her strength.  Reviewed home exercise guidelines with patient including THRR, RPE scale, and endpoints for exercise. Walking is limited by right leg fatigue, which patient states is a little better now. Pt states that she's walking 15 minutes most days of the week. Encouraged patient to increase walking duration or days/week to achieve 150 minutes/week, and patient is amenable to this. Patient knows how to manually count her pulse. Will increase workloads and hand weights next week. Pt's goal is to get back to walking like she used to.     Expected Outcomes  Progress workloads as tolerated to help build leg strength and increase stamina.  Continue to increase workloads as tolerated.  Patient will increase walking duration at home in addition to exercise at cardiac rehab to help build walking tolerance.         Discharge Exercise Prescription (Final Exercise Prescription Changes): Exercise Prescription Changes - 11/26/19 1504      Response to Exercise   Blood Pressure (Admit)  108/50    Blood Pressure (Exercise)  110/46    Blood Pressure (Exit)  114/70    Heart Rate (Admit)  80 bpm    Heart Rate (Exercise)  90 bpm    Heart Rate (Exit)  71 bpm    Rating of  Perceived Exertion (Exercise)  11    Symptoms  none    Duration  Progress to 30 minutes of  aerobic without signs/symptoms of physical distress    Intensity  THRR unchanged      Progression   Progression  Continue to progress workloads to maintain intensity without signs/symptoms of physical distress.    Average METs  2.4      Resistance Training   Training Prescription  Yes    Weight  2 lbs.     Reps  10-15    Time  10 Minutes      Interval Training   Interval Training  No      NuStep   Level  3    Minutes  15    METs  2.4      Arm Ergometer   Level  1    Minutes  15       Nutrition:  Target Goals: Understanding of nutrition  guidelines, daily intake of sodium 1500mg , cholesterol 200mg , calories 30% from fat and 7% or less from saturated fats, daily to have 5 or more servings of fruits and vegetables.  Biometrics: Pre Biometrics - 10/21/19 1357      Pre Biometrics   Height  5' 2.25" (1.581 m)    Weight  48.5 kg    Waist Circumference  28 inches    Hip Circumference  35.5 inches    Waist to Hip Ratio  0.79 %    BMI (Calculated)  19.4    Triceps Skinfold  14 mm    % Body Fat  29.8 %    Grip Strength  22 kg    Flexibility  11 in    Single Leg Stand  16.18 seconds        Nutrition Therapy Plan and Nutrition Goals: Nutrition Therapy & Goals - 11/02/19 0738      Nutrition Therapy   Diet  Heart Healthy/CKD      Personal Nutrition Goals   Nutrition Goal  Pt to identify and limit food sources of saturated fat, trans fat, refined carbohydrates and sodium    Personal Goal #2  Pt to learn to meal plan with low potassium and low sodium foods      Intervention Plan   Intervention  Prescribe, educate and counsel regarding individualized specific dietary modifications aiming towards targeted core components such as weight, hypertension, lipid management, diabetes, heart failure and other comorbidities.;Nutrition handout(s) given to patient.    Expected Outcomes  Long Term Goal: Adherence to prescribed nutrition plan.;Short Term Goal: A plan has been developed with personal nutrition goals set during dietitian appointment.       Nutrition Assessments: Nutrition Assessments - 11/02/19 0746      MEDFICTS Scores   Pre Score  26       Nutrition Goals Re-Evaluation: Nutrition Goals Re-Evaluation    Row Name 11/02/19 0745             Goals   Current Weight  105 lb (47.6 kg)       Nutrition Goal  Pt to identify and limit food sources of saturated fat, trans fat, refined carbohydrates and sodium         Personal Goal #2 Re-Evaluation   Personal Goal #2  Pt to learn to meal plan with low potassium and  low sodium foods          Nutrition Goals Discharge (Final Nutrition Goals Re-Evaluation): Nutrition Goals Re-Evaluation - 11/02/19 0745  Goals   Current Weight  105 lb (47.6 kg)    Nutrition Goal  Pt to identify and limit food sources of saturated fat, trans fat, refined carbohydrates and sodium      Personal Goal #2 Re-Evaluation   Personal Goal #2  Pt to learn to meal plan with low potassium and low sodium foods       Psychosocial: Target Goals: Acknowledge presence or absence of significant depression and/or stress, maximize coping skills, provide positive support system. Participant is able to verbalize types and ability to use techniques and skills needed for reducing stress and depression.  Initial Review & Psychosocial Screening: Initial Psych Review & Screening - 10/21/19 1426      Initial Review   Current issues with  History of Depression;Current Anxiety/Panic;Current Stress Concerns    Source of Stress Concerns  Chronic Illness;Family;Unable to participate in former interests or hobbies    Comments  Rachyl reports some stress and axiety r/t her husband's health and her own. She has a hisotry of depression but does not express any depression currently.  She feels that she could do better with managing her stress. She enjoys art classes and reports that she feels she would have more time to participate in this hobby now that she has sold her second home.      Family Dynamics   Good Support System?  Yes   Carrigan states that her husband and children are sources of support for her.     Barriers   Psychosocial barriers to participate in program  The patient should benefit from training in stress management and relaxation.      Screening Interventions   Interventions  Encouraged to exercise;To provide support and resources with identified psychosocial needs       Quality of Life Scores: Quality of Life - 10/21/19 1529      Quality of Life   Select  Quality of Life       Quality of Life Scores   Health/Function Pre  15.93 %    Socioeconomic Pre  18.17 %    Psych/Spiritual Pre  17.79 %    Family Pre  20.4 %    GLOBAL Pre  17.41 %      Scores of 19 and below usually indicate a poorer quality of life in these areas.  A difference of  2-3 points is a clinically meaningful difference.  A difference of 2-3 points in the total score of the Quality of Life Index has been associated with significant improvement in overall quality of life, self-image, physical symptoms, and general health in studies assessing change in quality of life.  PHQ-9: Recent Review Flowsheet Data    Depression screen [facility] 2/9 10/22/2019 10/22/2019 10/21/2019   Decreased Interest 0 0 0   Down, Depressed, Hopeless 0 0 0   PHQ - 2 Score 0 0 0     Interpretation of Total Score  Total Score Depression Severity:  1-4 = Minimal depression, 5-9 = Mild depression, 10-14 = Moderate depression, 15-19 = Moderately severe depression, 20-27 = Severe depression   Psychosocial Evaluation and Intervention:   Psychosocial Re-Evaluation: Psychosocial Re-Evaluation    [place] Name 11/04/19 1039 12/02/19 1548           Psychosocial Re-Evaluation   Current issues with  Current Anxiety/Panic;History of Depression;Current Stress Concerns  Current Anxiety/Panic;History of Depression;Current Stress Concerns      Comments  Gwendelyn remains concerned about her husband's health. Will continue to provide emotional support  as needed.  Justyn has not voiced any increased stressors will continue to offer support as needed      Interventions  Stress management education;Encouraged to attend Cardiac Rehabilitation for the exercise  Stress management education;Encouraged to attend Cardiac Rehabilitation for the exercise      Continue Psychosocial Services   Follow up required by staff  Follow up required by staff      Comments  Stanton Kidney reports some stress and axiety r/t her husband's health and her own. She has a hisotry of  depression but does not express any depression currently. Will continue to offer emotional support as needed  Elayna reports some stress and axiety r/t her husband's health and her own. She has a hisotry of depression but does not express any depression currently. Will continue to offer emotional support as needed        Initial Review   Source of Stress Concerns  Family  -         Psychosocial Discharge (Final Psychosocial Re-Evaluation): Psychosocial Re-Evaluation - 12/02/19 1548      Psychosocial Re-Evaluation   Current issues with  Current Anxiety/Panic;History of Depression;Current Stress Concerns    Comments  Derrick has not voiced any increased stressors will continue to offer support as needed    Interventions  Stress management education;Encouraged to attend Cardiac Rehabilitation for the exercise    Continue Psychosocial Services   Follow up required by staff    Comments  Stanton Kidney reports some stress and axiety r/t her husband's health and her own. She has a hisotry of depression but does not express any depression currently. Will continue to offer emotional support as needed       Vocational Rehabilitation: Provide vocational rehab assistance to qualifying candidates.   Vocational Rehab Evaluation & Intervention: Vocational Rehab - 10/21/19 1427      Initial Vocational Rehab Evaluation & Intervention   Assessment shows need for Vocational Rehabilitation  No       Education: Education Goals: Education classes will be provided on a weekly basis, covering required topics. Participant will state understanding/return demonstration of topics presented.  Learning Barriers/Preferences: Learning Barriers/Preferences - 10/21/19 1432      Learning Barriers/Preferences   Learning Barriers  Sight    Learning Preferences  Written Material;Pictoral       Education Topics: Hypertension, Hypertension Reduction -Define heart disease and high blood pressure. Discus how high blood pressure  affects the body and ways to reduce high blood pressure.   Exercise and Your Heart -Discuss why it is important to exercise, the FITT principles of exercise, normal and abnormal responses to exercise, and how to exercise safely.   Angina -Discuss definition of angina, causes of angina, treatment of angina, and how to decrease risk of having angina.   Cardiac Medications -Review what the following cardiac medications are used for, how they affect the body, and side effects that may occur when taking the medications.  Medications include Aspirin, Beta blockers, calcium channel blockers, ACE Inhibitors, angiotensin receptor blockers, diuretics, digoxin, and antihyperlipidemics.   Congestive Heart Failure -Discuss the definition of CHF, how to live with CHF, the signs and symptoms of CHF, and how keep track of weight and sodium intake.   Heart Disease and Intimacy -Discus the effect sexual activity has on the heart, how changes occur during intimacy as we age, and safety during sexual activity.   Smoking Cessation / COPD -Discuss different methods to quit smoking, the health benefits of quitting smoking, and the definition  of COPD.   Nutrition I: Fats -Discuss the types of cholesterol, what cholesterol does to the heart, and how cholesterol levels can be controlled.   Nutrition II: Labels -Discuss the different components of food labels and how to read food label   Heart Parts/Heart Disease and PAD -Discuss the anatomy of the heart, the pathway of blood circulation through the heart, and these are affected by heart disease.   Stress I: Signs and Symptoms -Discuss the causes of stress, how stress may lead to anxiety and depression, and ways to limit stress.   Stress II: Relaxation -Discuss different types of relaxation techniques to limit stress.   Warning Signs of Stroke / TIA -Discuss definition of a stroke, what the signs and symptoms are of a stroke, and how to identify  when someone is having stroke.   Knowledge Questionnaire Score: Knowledge Questionnaire Score - 10/21/19 1432      Knowledge Questionnaire Score   Pre Score  22/24       Core Components/Risk Factors/Patient Goals at Admission: Personal Goals and Risk Factors at Admission - 10/21/19 1411      Core Components/Risk Factors/Patient Goals on Admission    Weight Management  Yes;Weight Maintenance;Weight Gain    Intervention  Weight Management: Develop a combined nutrition and exercise program designed to reach desired caloric intake, while maintaining appropriate intake of nutrient and fiber, sodium and fats, and appropriate energy expenditure required for the weight goal.;Weight Management: Provide education and appropriate resources to help participant work on and attain dietary goals.    Admit Weight  106 lb 14.8 oz (48.5 kg)    Expected Outcomes  Short Term: Continue to assess and modify interventions until short term weight is achieved;Long Term: Adherence to nutrition and physical activity/exercise program aimed toward attainment of established weight goal;Weight Maintenance: Understanding of the daily nutrition guidelines, which includes 25-35% calories from fat, 7% or less cal from saturated fats, less than 200mg  cholesterol, less than 1.5gm of sodium, & 5 or more servings of fruits and vegetables daily;Understanding recommendations for meals to include 15-35% energy as protein, 25-35% energy from fat, 35-60% energy from carbohydrates, less than 200mg  of dietary cholesterol, 20-35 gm of total fiber daily;Understanding of distribution of calorie intake throughout the day with the consumption of 4-5 meals/snacks;Weight Gain: Understanding of general recommendations for a high calorie, high protein meal plan that promotes weight gain by distributing calorie intake throughout the day with the consumption for 4-5 meals, snacks, and/or supplements    Hypertension  Yes    Intervention  Provide  education on lifestyle modifcations including regular physical activity/exercise, weight management, moderate sodium restriction and increased consumption of fresh fruit, vegetables, and low fat dairy, alcohol moderation, and smoking cessation.;Monitor prescription use compliance.    Expected Outcomes  Short Term: Continued assessment and intervention until BP is < 140/56mm HG in hypertensive participants. < 130/23mm HG in hypertensive participants with diabetes, heart failure or chronic kidney disease.;Long Term: Maintenance of blood pressure at goal levels.    Lipids  Yes    Intervention  Provide education and support for participant on nutrition & aerobic/resistive exercise along with prescribed medications to achieve LDL 70mg , HDL >40mg .    Expected Outcomes  Short Term: Participant states understanding of desired cholesterol values and is compliant with medications prescribed. Participant is following exercise prescription and nutrition guidelines.;Long Term: Cholesterol controlled with medications as prescribed, with individualized exercise RX and with personalized nutrition plan. Value goals: LDL < 70mg , HDL > 40 mg.  Core Components/Risk Factors/Patient Goals Review:  Goals and Risk Factor Review    Row Name 11/04/19 1044 12/02/19 1550           Core Components/Risk Factors/Patient Goals Review   Personal Goals Review  Weight Management/Obesity;Hypertension;Lipids  Weight Management/Obesity;Hypertension;Lipids      Review  Romanda is off to a good start to exercise. Prajna's vital signs have been stable. Briseyda has had some issues with chronic back pain which she sees a Special educational needs teacher.  Leonna does well with exercise. Isebella's vital signs have been stable.      Expected Outcomes  Farzana will continue to particpate in phase 2 cardiac rehab for exercise, nutrition and lifestyle modificatons.  Genever will continue to particpate in phase 2 cardiac rehab for exercise, nutrition and lifestyle modificatons.          Core Components/Risk Factors/Patient Goals at Discharge (Final Review):  Goals and Risk Factor Review - 12/02/19 1550      Core Components/Risk Factors/Patient Goals Review   Personal Goals Review  Weight Management/Obesity;Hypertension;Lipids    Review  Baani does well with exercise. Yuktha's vital signs have been stable.    Expected Outcomes  Keshanna will continue to particpate in phase 2 cardiac rehab for exercise, nutrition and lifestyle modificatons.       ITP Comments: ITP Comments    Row Name 10/21/19 1328 11/04/19 1038 12/02/19 1547       ITP Comments  Dr. Fransico Him, Medical Director  30 Day ITP Review. Jochebed is off to a good start to exercise at cardiac rehab.  30 Day ITP Review. Oletta is doing well with exercise. Clista's attendance has been fair.        Comments: See ITP comments.Barnet Pall, RN,BSN 12/02/2019 3:55 PM

## 2019-12-03 ENCOUNTER — Encounter (HOSPITAL_COMMUNITY)
Admission: RE | Admit: 2019-12-03 | Discharge: 2019-12-03 | Disposition: A | Discharge status: Home / Self Care | Payer: Medicare Other | Source: Ambulatory Visit | Attending: Cardiovascular Disease | Admitting: Cardiovascular Disease

## 2019-12-03 ENCOUNTER — Other Ambulatory Visit: Payer: Self-pay

## 2019-12-03 DIAGNOSIS — I214 Non-ST elevation (NSTEMI) myocardial infarction: Secondary | ICD-10-CM | POA: Diagnosis not present

## 2019-12-03 NOTE — Progress Notes (Signed)
QUALITY OF LIFE SCORE REVIEW  Pt completed Quality of Life survey as a participant in Cardiac Rehab. Scores 21.0 or below are considered low. Pt score very low in several areas Overall 17.41, Health and Function 15.93, socioeconomic 18.17, physiological and spiritual 17.79, family 20.20. Patient quality of life slightly altered by physical constraints which limits ability to perform as prior to recent cardiac illness. Shelly [name] said that initially when she was dissatisfied with her health due to her cardiac condition and husband's health. Shelly [name] reports that she is feeling better since she has been participating in phase 2 cardiac rehab. Patient see's Dr Toy Care at [facility]. Patient denies being depressed but has a history of depression  .  Offered emotional support and reassurance.  Will continue to monitor and intervene as necessary.  Will forward quality of life questionnaire to Dr Lyda Jester office for review.Barnet Pall, RN,BSN 12/03/2019 4:36 PM

## 2019-12-06 ENCOUNTER — Encounter (HOSPITAL_COMMUNITY)
Admission: RE | Admit: 2019-12-06 | Discharge: 2019-12-06 | Disposition: A | Discharge status: Home / Self Care | Payer: Medicare Other | Source: Ambulatory Visit | Attending: Cardiovascular Disease | Admitting: Cardiovascular Disease

## 2019-12-06 ENCOUNTER — Other Ambulatory Visit: Payer: Self-pay

## 2019-12-06 DIAGNOSIS — I214 Non-ST elevation (NSTEMI) myocardial infarction: Secondary | ICD-10-CM | POA: Diagnosis not present

## 2019-12-08 ENCOUNTER — Other Ambulatory Visit: Payer: Self-pay

## 2019-12-08 ENCOUNTER — Encounter: Payer: Self-pay | Admitting: Cardiovascular Disease

## 2019-12-08 ENCOUNTER — Encounter (HOSPITAL_COMMUNITY): Payer: Medicare Other

## 2019-12-08 ENCOUNTER — Ambulatory Visit: Payer: Medicare Other | Admitting: Cardiovascular Disease

## 2019-12-08 VITALS — BP 124/56 | HR 70 | Ht 62.0 in | Wt 107.8 lb

## 2019-12-08 DIAGNOSIS — I252 Old myocardial infarction: Secondary | ICD-10-CM | POA: Diagnosis not present

## 2019-12-08 DIAGNOSIS — I2510 Atherosclerotic heart disease of native coronary artery without angina pectoris: Secondary | ICD-10-CM

## 2019-12-08 DIAGNOSIS — I5022 Chronic systolic (congestive) heart failure: Secondary | ICD-10-CM

## 2019-12-08 NOTE — Patient Instructions (Signed)
Medication Instructions:  Your physician recommends that you continue on your current medications as directed. Please refer to the Current Medication list given to you today.  *If you need a refill on your cardiac medications before your next appointment, please call your pharmacy*   Lab Work: None Ordered    Testing/Procedures: Your physician has requested that you have an echocardiogram in 6 months prior to your office visit. Echocardiography is a painless test that uses sound waves to create images of your heart. It provides your doctor with information about the size and shape of your heart and how well your heart's chambers and valves are working. This procedure takes approximately one hour. There are no restrictions for this procedure.    Follow-Up: At [facility], you and your health needs are our priority.  As part of our continuing mission to provide you with exceptional heart care, we have created designated Provider Care Teams.  These Care Teams include your primary Cardiologist (physician) and Advanced Practice Providers (APPs -  Physician Assistants and Nurse Practitioners) who all work together to provide you with the care you need, when you need it.  Your next appointment:   6 month(s)  The format for your next appointment:   Either In Person or Virtual  Provider:   Richardson Dopp, PA-C, Robbie Lis, PA-C or Daune Perch, NP

## 2019-12-08 NOTE — Progress Notes (Signed)
Cardiology Office Note:    Date:  12/08/2019   ID:  [name], DOB [birth year], MRN [id number]  PCP:  Shon Baton, MD  Cardiologist:  Elverna Caffee  Electrophysiologist:  None   Referring MD: Shon Baton, MD   Chief Complaint  Patient presents with  . Congestive Heart Failure  . Coronary Artery Disease    History of Present Illness:    Shelly [name] is a 77 y.o. female with a hx of CHF. She was seen in the [facility] ER yesterday  am and was scheduled for an appt with the DOD the following day .  Echo on Sept. 4 revealed EF of 30-35%.   Grade 1 diastolic dysfunction.  Moderate pulmonary HTN with PA pressure of 56 mmHg. Mild - mod MR   We were asked to see her by Dr. Virgina Jock for further evaluation of sudden onset of rapid HR .   [place]  Woke up , could not breath  Felt weak.   Called EMS.   EMS did an ECG but HR had slowed by that time .   Came back to [name] to urgent care. .  ECG showed marked TWI ,  Felt fine,  Did not get admitted.   Echo was ordered and showed EF of 30-35%.  , moderate hypokinesis of anterior wall  PA pressure of 56 mmHg.  Was stared on Coreg 3. 125 BID   Has had some tightness in her chest for most of this summer. Really has not felt well Has been getting tired early . Has tightness in her chest with walking .   Would typically resolve when she stopped to rest.    She is had several episodes at night where she has very fast and rapid heart rate irregularities.  These are associated with intense chest pain and lightheadedness.  Clinically these could be consistent with ventricular tachycardia.  December 08, 2019:  Shelly [name] is seen today for follow-up visit.  When I saw her several months ago it appeared that she had had a recent anterior wall myocardial infarction and was still symptomatic.  She was in congestive heart failure.  We admitted her to the hospital and arrange for her to have a heart catheterization.   At heart cath she was found to have a diffusely diseased LAD with a moderate proximal stenosis and severe diffuse mid/distal disease.  The vessel was unfavorable for PCI.  She had mild nonobstructive disease in the right coronary artery and left circumflex artery and start on medical therapy.  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  The LV dysfunction seem to be out of proportion related to her degree of coronary artery disease.  Has improved on medical Rx.  No CP , ,   Breathing is good   Has cluadication of her right leg ( sees Dr. Ruta Hinds )   Past Medical History:  Diagnosis Date  . Anxiety   . Arthritis    right elbow  . 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 Breast cancer ([place]) 03/2010   left breast s/p lumpectomy; adjuvant radiation; on adjuvant homrnal therapy  . Chronic kidney disease    stage 3  . COPD (chronic obstructive pulmonary disease) ([place])   . Hay fever   . History of breast cancer 09/29/2012  .  Hypertension   . Hypothyroidism   . Macular degeneration   . Migraine   . MVP (mitral valve prolapse)   . Osteopenia   . PONV (postoperative nausea and vomiting)   . Right arm fracture   . Sciatic pain   . Scoliosis     Past Surgical History:  Procedure Laterality Date  . APPENDECTOMY    . BREAST BIOPSY    . BREAST LUMPECTOMY    . CATARACT EXTRACTION Right   . COLONOSCOPY    . DILATATION & CURETTAGE/HYSTEROSCOPY WITH MYOSURE N/A 05/24/2016   Procedure: DILATATION & CURETTAGE/HYSTEROSCOPY WITH MYOSURE;  Surgeon: Azucena Fallen, MD;  Location: [facility];  Service: Gynecology;  Laterality: N/A;  400 cc deficit  . DILATION AND CURETTAGE OF UTERUS     uterine polyps  . EYE SURGERY Right    retina  . HERNIA REPAIR    . LAPAROTOMY    . LEFT HEART CATH AND CORONARY ANGIOGRAPHY N/A 09/07/2019   Procedure: LEFT HEART CATH AND CORONARY  ANGIOGRAPHY;  Surgeon: [name], MD;  Location: [facility];  Service: Cardiovascular;  Laterality: N/A;  . right arm surgery     due to fracture  . TONSILLECTOMY    . TUBAL LIGATION    . UNILATERAL SALPINGECTOMY Left   . UPPER GI ENDOSCOPY    . WISDOM TOOTH EXTRACTION      Current Medications: Current Meds  Medication Sig  . aspirin EC 81 MG tablet Take 81 mg by mouth daily.  . Biotin 1 MG CAPS Take 1 mg by mouth daily.   Marland Kitchen buPROPion (WELLBUTRIN XL) 150 MG 24 hr tablet Take 150 mg by mouth every morning.   . carvedilol (COREG) 12.5 MG tablet Take 1 tablet (12.5 mg total) by mouth 2 (two) times daily with a meal.  . cetirizine (ZYRTEC) 10 MG tablet Take 10 mg by mouth daily.  . Cholecalciferol (VITAMIN D-3) 25 MCG (1000 UT) CAPS Take 1,000 Units by mouth daily.  . furosemide (LASIX) 20 MG tablet Take 20 mg by mouth every other day.   . hydrALAZINE (APRESOLINE) 25 MG tablet Take 1.5 tablets (37.5 mg total) by mouth 2 (two) times daily.  . isosorbide mononitrate (IMDUR) 30 MG 24 hr tablet Take 1 tablet (30 mg total) by mouth daily.  . Multiple Vitamins-Minerals (PRESERVISION AREDS PO) Take by mouth.  . Multiple Vitamins-Minerals (PRESERVISION/LUTEIN) CAPS Take 1 capsule by mouth every morning.   . rosuvastatin (CRESTOR) 20 MG tablet Take 1 tablet (20 mg total) by mouth daily at 6 PM.  . SYNTHROID 50 MCG tablet Take 50 mcg by mouth daily before breakfast. Take one tablet (50 mcg)  by mouth Saturday and Sunday before breakfast. Take 1.5 tablets (75 mcg) M-F.     Allergies:   Morphine and related, Yellow dyes (non-tartrazine), Amoxicillin, Eggs or egg-derived products, Penicillins, Pollen extract, and Lamictal [lamotrigine]   Social History   Socioeconomic History  . Marital status: Married    Spouse name: Not on file  . Number of children: Not on file  . Years of education: Not on file  . Highest education level: Not on file  Occupational History  . Not on file    Tobacco Use  . Smoking status: Former Smoker    Packs/day: 0.50    Years: 25.00    Pack years: 12.50    Quit date: 08/07/1985    Years since quitting: 34.3  . Smokeless tobacco: Never Used  Substance and Sexual Activity  .  Alcohol use: No    Alcohol/week: 1.0 standard drinks    Types: 1 Glasses of wine per week  . Drug use: No  . Sexual activity: Yes    Birth control/protection: Post-menopausal  Other Topics Concern  . Not on file  Social History Narrative  . Not on file   Social Determinants of Health   Financial Resource Strain: Low Risk   . Difficulty of Paying Living Expenses: Not hard at all  Food Insecurity: No Food Insecurity  . Worried About Charity fundraiser in the Last Year: Never true  . Ran Out of Food in the Last Year: Never true  Transportation Needs: No Transportation Needs  . Lack of Transportation (Medical): No  . Lack of Transportation (Non-Medical): No  Physical Activity: Sufficiently Active  . Days of Exercise per Week: 5 days  . Minutes of Exercise per Session: 30 min  Stress: No Stress Concern Present  . Feeling of Stress : Only a little  Social Connections: Slightly Isolated  . Frequency of Communication with Friends and Family: More than three times a week  . Frequency of Social Gatherings with Friends and Family: Three times a week  . Attends Religious Services: More than 4 times per year  . Active Member of Clubs or Organizations: No  . Attends Archivist Meetings: Never  . Marital Status: Married     Family History: The patient's family history is not on file.  ROS:   Please see the history of present illness.     All other systems reviewed and are negative.  EKGs/Labs/Other Studies Reviewed:    The following studies were reviewed today:   EKG:    Recent Labs: 08/17/2019: TSH 0.615 09/02/2019: Magnesium 2.2 09/03/2019: B Natriuretic Peptide 1,032.6 09/08/2019: Hemoglobin 10.0; Platelets 176 09/17/2019: BUN 41;  Creatinine, Ser 1.98; Potassium 5.0; Sodium 141  Recent Lipid Panel    Component Value Date/Time   CHOL 164 09/05/2019 0011   TRIG 57 09/05/2019 0011   HDL 78 09/05/2019 0011   CHOLHDL 2.1 09/05/2019 0011   VLDL 11 09/05/2019 0011   LDLCALC 75 09/05/2019 0011    Physical Exam:    Physical Exam: Blood pressure (!) 124/56, pulse 70, height 5\' 2"  (1.575 m), weight 107 lb 12.8 oz (48.9 kg), SpO2 99 %.  GEN:  Well nourished, well developed in no acute distress HEENT: Normal NECK: No JVD; No carotid bruits LYMPHATICS: No lymphadenopathy CARDIAC: RRR  RESPIRATORY:  Clear to auscultation without rales, wheezing or rhonchi  ABDOMEN: Soft, non-tender, non-distended MUSCULOSKELETAL:   Good femoral pulses,  Poor right foot pulses.  SKIN: Warm and dry NEUROLOGIC:  Alert and oriented x 3   ASSESSMENT:    1. Chronic systolic heart failure ([place])   2. Coronary artery disease involving native coronary artery of native heart without angina pectoris   3. Myocardial infarct, old    PLAN:    In order of problems listed above:  1.  Coronary artery disease:   Has diffuse disease of the mid- distal LAD .  She developed acute heart failure that was thought to be possibly due to Takotsubo syndrome superimposed on some underlying distal LAD disease.  She has been on good medical therapy and her symptoms have greatly improved.  We will repeat her echocardiogram in approximately 6 months and I will have her see an APP the week after the echocardiogram.  2.  Hypertension: Blood pressure is well controlled.  3. PAD :   Has  R leg cluadication .  She is followed by Dr. Ruta Hinds.  4.  Chronic kidney disease: Followed by her primary medical doctor and nephrology.   Medication Adjustments/Labs and Tests Ordered: Current medicines are reviewed at length with the patient today.  Concerns regarding medicines are outlined above.  Orders Placed This Encounter  Procedures  . ECHOCARDIOGRAM COMPLETE    No orders of the defined types were placed in this encounter.   Patient Instructions  Medication Instructions:  Your physician recommends that you continue on your current medications as directed. Please refer to the Current Medication list given to you today.  *If you need a refill on your cardiac medications before your next appointment, please call your pharmacy*   Lab Work: None Ordered    Testing/Procedures: Your physician has requested that you have an echocardiogram in 6 months prior to your office visit. Echocardiography is a painless test that uses sound waves to create images of your heart. It provides your doctor with information about the size and shape of your heart and how well your heart's chambers and valves are working. This procedure takes approximately one hour. There are no restrictions for this procedure.    Follow-Up: At [facility], you and your health needs are our priority.  As part of our continuing mission to provide you with exceptional heart care, we have created designated Provider Care Teams.  These Care Teams include your primary Cardiologist (physician) and Advanced Practice Providers (APPs -  Physician Assistants and Nurse Practitioners) who all work together to provide you with the care you need, when you need it.  Your next appointment:   6 month(s)  The format for your next appointment:   Either In Person or Virtual  Provider:   Richardson Dopp, PA-C, Robbie Lis, PA-C or Daune Perch, NP       Signed, Mertie Moores, MD  12/08/2019 5:31 PM    [place]

## 2019-12-10 ENCOUNTER — Encounter (HOSPITAL_COMMUNITY)
Admission: RE | Admit: 2019-12-10 | Discharge: 2019-12-10 | Disposition: A | Discharge status: Home / Self Care | Payer: Medicare Other | Source: Ambulatory Visit | Attending: Cardiovascular Disease | Admitting: Cardiovascular Disease

## 2019-12-10 ENCOUNTER — Other Ambulatory Visit: Payer: Self-pay

## 2019-12-10 DIAGNOSIS — I214 Non-ST elevation (NSTEMI) myocardial infarction: Secondary | ICD-10-CM | POA: Diagnosis not present

## 2019-12-13 ENCOUNTER — Other Ambulatory Visit: Payer: Self-pay

## 2019-12-13 ENCOUNTER — Encounter (HOSPITAL_COMMUNITY)
Admission: RE | Admit: 2019-12-13 | Discharge: 2019-12-13 | Disposition: A | Discharge status: Home / Self Care | Payer: Medicare Other | Source: Ambulatory Visit | Attending: Cardiovascular Disease | Admitting: Cardiovascular Disease

## 2019-12-13 DIAGNOSIS — I214 Non-ST elevation (NSTEMI) myocardial infarction: Secondary | ICD-10-CM

## 2019-12-15 ENCOUNTER — Encounter (HOSPITAL_COMMUNITY)
Admission: RE | Admit: 2019-12-15 | Discharge: 2019-12-15 | Disposition: A | Discharge status: Home / Self Care | Payer: Medicare Other | Source: Ambulatory Visit | Attending: Cardiovascular Disease | Admitting: Cardiovascular Disease

## 2019-12-15 ENCOUNTER — Other Ambulatory Visit: Payer: Self-pay

## 2019-12-15 VITALS — BP 102/58 | HR 73 | Temp 96.8°F | Ht 62.25 in | Wt 105.8 lb

## 2019-12-15 DIAGNOSIS — I214 Non-ST elevation (NSTEMI) myocardial infarction: Secondary | ICD-10-CM | POA: Diagnosis not present

## 2019-12-15 NOTE — Progress Notes (Signed)
Discharge Progress Report  Patient Details  Name: Shelly [name] MRN: [id number] Date of Birth: Nov 19, 1941 Referring Provider:     CARDIAC REHAB PHASE II ORIENTATION from 10/21/2019 in [place]  Referring Provider  Dr. Cathie Olden       Number of Visits: 17  Reason for Discharge:  Patient reached a stable level of exercise. Patient independent in their exercise. Patient has met program and personal goals.  Smoking History:  Social History   Tobacco Use  Smoking Status Former Smoker  . Packs/day: 0.50  . Years: 25.00  . Pack years: 12.50  . Quit date: 08/07/1985  . Years since quitting: 34.3  Smokeless Tobacco Never Used    Diagnosis:  NSTEMI (non-ST elevated myocardial infarction) ([place])  ADL UCSD:   Initial Exercise Prescription: Initial Exercise Prescription - 10/21/19 1400      Date of Initial Exercise RX and Referring Provider   Date  10/21/19    Referring Provider  Dr. Cathie Olden    Expected Discharge Date  12/15/19      Treadmill   MPH  1.7    Grade  0    Minutes  15      NuStep   Level  2    SPM  75    Minutes  15    METs  2      Prescription Details   Frequency (times per week)  3    Duration  Progress to 30 minutes of continuous aerobic without signs/symptoms of physical distress      Intensity   THRR 40-80% of Max Heartrate  56-112    Ratings of Perceived Exertion  11-13      Progression   Progression  Continue to progress workloads to maintain intensity without signs/symptoms of physical distress.      Resistance Training   Training Prescription  Yes    Weight  2 lbs.     Reps  10-15       Discharge Exercise Prescription (Final Exercise Prescription Changes): Exercise Prescription Changes - 12/15/19 1504      Response to Exercise   Blood Pressure (Admit)  102/58    Blood Pressure (Exercise)  126/56    Blood Pressure (Exit)  110/50    Heart Rate (Admit)  73 bpm    Heart Rate (Exercise)  88 bpm    Heart  Rate (Exit)  81 bpm    Rating of Perceived Exertion (Exercise)  13    Symptoms  none    Duration  Progress to 30 minutes of  aerobic without signs/symptoms of physical distress    Intensity  THRR unchanged      Progression   Progression  Continue to progress workloads to maintain intensity without signs/symptoms of physical distress.    Average METs  2.2      Resistance Training   Training Prescription  No   Relaxation day, no weights.     Interval Training   Interval Training  No      NuStep   Level  4    Minutes  15    METs  2.2      Arm Ergometer   Level  2.5    Minutes  15      Home Exercise Plan   Plans to continue exercise at  Home (comment)    Frequency  Add 4 additional days to program exercise sessions.   Walking, stationary bike   Initial Home Exercises Provided  12/01/19  Functional Capacity: 6 Minute Walk    Row Name 10/21/19 1355 12/10/19 1514       6 Minute Walk   Phase  Initial  Discharge    Distance  1188 feet  1330 feet    Distance % Change  --  11.95 %    Distance Feet Change  --  142 ft    Walk Time  6 minutes  6 minutes    # of Rest Breaks  0  0    MPH  2.25  2.52    METS  2.41  2.58    RPE  12  12    Perceived Dyspnea   0  0    VO2 Peak  8.44  9.02    Symptoms  No  Yes (comment)    Comments  --  Patient c/o right leg pain, whcih she rated a 3/10 on the pain scale.    Resting HR  75 bpm  75 bpm    Resting BP  102/56  118/64    Resting Oxygen Saturation   96 %  --    Exercise Oxygen Saturation  during 6 min walk  97 %  --    Max Ex. HR  99 bpm  86 bpm    Max Ex. BP  124/62  128/78    2 Minute Post BP  102/56  104/52       Psychological, QOL, Others - Outcomes: PHQ 2/9: Depression screen [facility] 2/9 10/22/2019 10/22/2019 10/21/2019  Decreased Interest 0 0 0  Down, Depressed, Hopeless 0 0 0  PHQ - 2 Score 0 0 0    Quality of Life: Quality of Life - 12/15/19 1508      Quality of Life   Select  Quality of Life      Quality  of Life Scores   Health/Function Pre  15.93 %    Health/Function Post  18 %    Health/Function % Change  12.99 %    Socioeconomic Pre  18.17 %    Socioeconomic Post  17.21 %    Socioeconomic % Change   -5.28 %    Psych/Spiritual Pre  17.79 %    Psych/Spiritual Post  11.58 %    Psych/Spiritual % Change  -34.91 %    Family Pre  20.4 %    Family Post  21.6 %    Family % Change  5.88 %    GLOBAL Pre  17.41 %    GLOBAL Post  17.21 %    GLOBAL % Change  -1.15 %       Personal Goals: Goals established at orientation with interventions provided to work toward goal. Personal Goals and Risk Factors at Admission - 10/21/19 1411      Core Components/Risk Factors/Patient Goals on Admission    Weight Management  Yes;Weight Maintenance;Weight Gain    Intervention  Weight Management: Develop a combined nutrition and exercise program designed to reach desired caloric intake, while maintaining appropriate intake of nutrient and fiber, sodium and fats, and appropriate energy expenditure required for the weight goal.;Weight Management: Provide education and appropriate resources to help participant work on and attain dietary goals.    Admit Weight  106 lb 14.8 oz (48.5 kg)    Expected Outcomes  Short Term: Continue to assess and modify interventions until short term weight is achieved;Long Term: Adherence to nutrition and physical activity/exercise program aimed toward attainment of established weight goal;Weight Maintenance: Understanding of the daily nutrition guidelines, which  includes 25-35% calories from fat, 7% or less cal from saturated fats, less than 272m cholesterol, less than 1.5gm of sodium, & 5 or more servings of fruits and vegetables daily;Understanding recommendations for meals to include 15-35% energy as protein, 25-35% energy from fat, 35-60% energy from carbohydrates, less than 2039mof dietary cholesterol, 20-35 gm of total fiber daily;Understanding of distribution of calorie intake  throughout the day with the consumption of 4-5 meals/snacks;Weight Gain: Understanding of general recommendations for a high calorie, high protein meal plan that promotes weight gain by distributing calorie intake throughout the day with the consumption for 4-5 meals, snacks, and/or supplements    Hypertension  Yes    Intervention  Provide education on lifestyle modifcations including regular physical activity/exercise, weight management, moderate sodium restriction and increased consumption of fresh fruit, vegetables, and low fat dairy, alcohol moderation, and smoking cessation.;Monitor prescription use compliance.    Expected Outcomes  Short Term: Continued assessment and intervention until BP is < 140/9065mG in hypertensive participants. < 130/74m10m in hypertensive participants with diabetes, heart failure or chronic kidney disease.;Long Term: Maintenance of blood pressure at goal levels.    Lipids  Yes    Intervention  Provide education and support for participant on nutrition & aerobic/resistive exercise along with prescribed medications to achieve LDL <70mg66mL >40mg.1mExpected Outcomes  Short Term: Participant states understanding of desired cholesterol values and is compliant with medications prescribed. Participant is following exercise prescription and nutrition guidelines.;Long Term: Cholesterol controlled with medications as prescribed, with individualized exercise RX and with personalized nutrition plan. Value goals: LDL < 70mg, 43m> 40 mg.        Personal Goals Discharge: Goals and Risk Factor Review    Row Name 11/04/19 1044 12/02/19 1550 12/21/19 0729         Core Components/Risk Factors/Patient Goals Review   Personal Goals Review  Weight Management/Obesity;Hypertension;Lipids  Weight Management/Obesity;Hypertension;Lipids  Weight Management/Obesity;Hypertension;Lipids     Review  Gracelee isCorryn to a good start to exercise. Zakaria's vital signs have been stable. 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 Vanetta's vital signs have been stable.  Patient with multiple CAD risk factors. She plans to continue to decrease her CAD risk factors by daily exercise, medication compliance, and lifestyle modifications including diet.     Expected Outcomes  Tieisha wiMeleaneontinue to particpate in phase 2 cardiac rehab for exercise, nutrition and lifestyle modificatons.  Blyss wiNevaenontinue to particpate in phase 2 cardiac rehab for exercise, nutrition and lifestyle modificatons.  Maudine wiPhenixontinue lifestyle modifications at discharge. She will continue to follow her discharge exercise plan provided to her.        Exercise Goals and Review: Exercise Goals    Row Name 10/21/19 1411             Exercise Goals   Increase Physical Activity  Yes       Intervention  Provide advice, education, support and counseling about physical activity/exercise needs.;Develop an individualized exercise prescription for aerobic and resistive training based on initial evaluation findings, risk stratification, comorbidities and participant's personal goals.       Expected Outcomes  Short Term: Attend rehab on a regular basis to increase amount of physical activity.;Long Term: Add in home exercise to make exercise part of routine and to increase amount of physical activity.;Long Term: Exercising regularly at least 3-5 days a week.  Increase Strength and Stamina  Yes       Intervention  Provide advice, education, support and counseling about physical activity/exercise needs.;Develop an individualized exercise prescription for aerobic and resistive training based on initial evaluation findings, risk stratification, comorbidities and participant's personal goals.       Expected Outcomes  Short Term: Increase workloads from initial exercise prescription for resistance, speed, and METs.;Short Term: Perform resistance training exercises routinely during  rehab and add in resistance training at home;Long Term: Improve cardiorespiratory fitness, muscular endurance and strength as measured by increased METs and functional capacity (6MWT)       Able to understand and use rate of perceived exertion (RPE) scale  Yes       Intervention  Provide education and explanation on how to use RPE scale       Expected Outcomes  Short Term: Able to use RPE daily in rehab to express subjective intensity level;Long Term:  Able to use RPE to guide intensity level when exercising independently       Knowledge and understanding of Target Heart Rate Range (THRR)  Yes       Intervention  Provide education and explanation of THRR including how the numbers were predicted and where they are located for reference       Expected Outcomes  Short Term: Able to state/look up THRR;Long Term: Able to use THRR to govern intensity when exercising independently;Short Term: Able to use daily as guideline for intensity in rehab       Able to check pulse independently  Yes       Intervention  Provide education and demonstration on how to check pulse in carotid and radial arteries.;Review the importance of being able to check your own pulse for safety during independent exercise       Expected Outcomes  Short Term: Able to explain why pulse checking is important during independent exercise;Long Term: Able to check pulse independently and accurately       Understanding of Exercise Prescription  Yes       Intervention  Provide education, explanation, and written materials on patient's individual exercise prescription       Expected Outcomes  Short Term: Able to explain program exercise prescription;Long Term: Able to explain home exercise prescription to exercise independently       Improve claudication pain toleration; Improve walking ability  Yes       Intervention  Participate in PAD/SET Rehab 2-3 days a week, walking at home as part of exercise prescription;Attend education sessions to aid  in risk factor modification and understanding of disease process       Expected Outcomes  Short Term: Improve walking distance/time to onset of claudication pain;Long Term: Improve score of PAD questionnaires;Long Term: Improve walking ability and toleration to claudication          Exercise Goals Re-Evaluation: Exercise Goals Re-Evaluation    Row Name 10/25/19 1613 11/03/19 1520 12/01/19 1530 12/15/19 1612       Exercise Goal Re-Evaluation   Exercise Goals Review  Able to understand and use rate of perceived exertion (RPE) scale;Increase Physical Activity;Increase Strength and Stamina  Able to understand and use rate of perceived exertion (RPE) scale;Increase Physical Activity;Increase Strength and Stamina  Able to understand and use rate of perceived exertion (RPE) scale;Increase Physical Activity;Increase Strength and Stamina;Able to check pulse independently;Knowledge and understanding of Target Heart Rate Range (THRR);Understanding of Exercise Prescription  Able to understand and use rate of perceived exertion (RPE) scale;Increase Physical  Activity;Increase Strength and Stamina;Able to check pulse independently;Knowledge and understanding of Target Heart Rate Range (THRR);Understanding of Exercise Prescription    Comments  Patient able to understand and use RPE scale appropriately. Pt c/o leg weakness moved from treadmill to recumbent bike. Discussed increasing walking at home from 15 minutes to 30 minutes by adding 1-2 minutes each session until goal is achieved, and pt is amenable to this. Will hold walking on TM at cardiac rehab until legs are strong and pt more steady walking on treadmill.  Patient tolerated low intensity exercise well without back pain. Encouraged pt to increase steps/min on the Nustep and pt is amenable to this. Pt is walking some when walking her dogs. pt's goal is to increase her strength.  Reviewed home exercise guidelines with patient including THRR, RPE scale, and  endpoints for exercise. Walking is limited by right leg fatigue, which patient states is a little better now. Pt states that she's walking 15 minutes most days of the week. Encouraged patient to increase walking duration or days/week to achieve 150 minutes/week, and patient is amenable to this. Patient knows how to manually count her pulse. Will increase workloads and hand weights next week. Pt's goal is to get back to walking like she used to.  Patient completed the phase 2 cardiac rehab program and has  progressed well. Functional capacity increased 12% as measured by 6MWT and strength increased 36% as measured by grip strength test, flexibilty increased 20% as measured by sit-and-reach test. Pt is walking at least 15 minutes most days of the week and has a stationary bike, which she feels she will be able to ride 30 minutes most days/week as well. Pt also has 3 lb weights at home and plans to do 1-2 days/week of resistance training.    Expected Outcomes  Progress workloads as tolerated to help build leg strength and increase stamina.  Continue to increase workloads as tolerated.  Patient will increase walking duration at home in addition to exercise at cardiac rehab to help build walking tolerance.  Patient will and/or ride her stationary bike 30 minutes most days/week and add resistance training 1-2 days week to help maintain her health and fitness gains.       Nutrition & Weight - Outcomes: Pre Biometrics - 10/21/19 1357      Pre Biometrics   Height  5' 2.25" (1.581 m)    Weight  48.5 kg    Waist Circumference  28 inches    Hip Circumference  35.5 inches    Waist to Hip Ratio  0.79 %    BMI (Calculated)  19.4    Triceps Skinfold  14 mm    % Body Fat  29.8 %    Grip Strength  22 kg    Flexibility  11 in    Single Leg Stand  16.18 seconds      Post Biometrics - 12/15/19 1504       Post  Biometrics   Height  5' 2.25" (1.581 m)    Weight  48 kg    Waist Circumference  28 inches    Hip  Circumference  35.5 inches    Waist to Hip Ratio  0.79 %    BMI (Calculated)  19.2    Triceps Skinfold  13 mm    % Body Fat  29.3 %    Grip Strength  30 kg    Flexibility  13.25 in    Single Leg Stand  10.05 seconds  Nutrition: Nutrition Therapy & Goals - 11/02/19 0738      Nutrition Therapy   Diet  Heart Healthy/CKD      Personal Nutrition Goals   Nutrition Goal  Pt to identify and limit food sources of saturated fat, trans fat, refined carbohydrates and sodium    Personal Goal #2  Pt to learn to meal plan with low potassium and low sodium foods      Intervention Plan   Intervention  Prescribe, educate and counsel regarding individualized specific dietary modifications aiming towards targeted core components such as weight, hypertension, lipid management, diabetes, heart failure and other comorbidities.;Nutrition handout(s) given to patient.    Expected Outcomes  Long Term Goal: Adherence to prescribed nutrition plan.;Short Term Goal: A plan has been developed with personal nutrition goals set during dietitian appointment.       Nutrition Discharge: Nutrition Assessments - 12/20/19 1129      MEDFICTS Scores   Pre Score  59       Education Questionnaire Score: Knowledge Questionnaire Score - 12/15/19 1509      Knowledge Questionnaire Score   Pre Score  22/24    Post Score  23/24       Pt graduated from cardiac rehab program today with completion of 14 exercise sessions in Phase II. Pt maintained good attendance and progressed nicely during his participation in rehab as evidenced by increased MET level. Medication list reconciled. Repeat  PHQ score-0.  Pt has made significant lifestyle changes and should be commended for his success. Pt feels he has achieved his goals during cardiac rehab.  Goals reviewed with patient; copy given to patient.

## 2019-12-27 DIAGNOSIS — I2510 Atherosclerotic heart disease of native coronary artery without angina pectoris: Secondary | ICD-10-CM | POA: Insufficient documentation

## 2020-01-18 ENCOUNTER — Ambulatory Visit: Payer: Medicare Other

## 2020-01-27 ENCOUNTER — Ambulatory Visit: Payer: Medicare Other | Attending: Internal Medicine

## 2020-01-27 DIAGNOSIS — Z23 Encounter for immunization: Secondary | ICD-10-CM | POA: Insufficient documentation

## 2020-01-27 NOTE — Progress Notes (Signed)
   Covid-19 Vaccination Clinic  Name:  Shelly [name]    MRN: [id number] DOB: 06-18-1941  01/27/2020  Shelly [name] was observed post Covid-19 immunization for 30 minutes based on pre-vaccination screening without incidence. She was provided with Vaccine Information Sheet and instruction to access the V-Safe system.   Shelly [name] was instructed to call 911 with any severe reactions post vaccine: Marland Kitchen Difficulty breathing  . Swelling of your face and throat  . A fast heartbeat  . A bad rash all over your body  . Dizziness and weakness    Immunizations Administered    Name Date Dose VIS Date Route   Pfizer COVID-19 Vaccine 01/27/2020  2:49 PM 0.3 mL 12/03/2019 Intramuscular   Manufacturer: [place]   Lot: [id number]   [place]: [id number]

## 2020-01-28 ENCOUNTER — Telehealth: Payer: Self-pay | Admitting: Cardiology

## 2020-01-28 MED ORDER — ATORVASTATIN CALCIUM 40 MG PO TABS: 40.0000 mg | ORAL_TABLET | Freq: Every day | ORAL | 3 refills | Status: DC

## 2020-01-28 NOTE — Telephone Encounter (Signed)
The patient is currently on rosuvastatin 20 mg daily.  With her declining renal function we would like to switch her to atorvastatin 40 mg daily which would be okay with her renal function.  I called the patient to discuss this with her and left a voicemail for her to call back.

## 2020-01-28 NOTE — Telephone Encounter (Signed)
I spoke to Shelly [name] and she agrees to switch rosuvastatin to atorvastatin 40 mg. She notes that she is having labs done today and she is going to see a nephrologist on Monday.

## 2020-01-28 NOTE — Addendum Note (Signed)
Addended by: Daune Perch D on: 01/28/2020 02:11 PM   Modules accepted: Orders

## 2020-02-08 ENCOUNTER — Ambulatory Visit: Payer: Medicare Other

## 2020-02-21 ENCOUNTER — Ambulatory Visit: Payer: Medicare Other | Attending: Internal Medicine

## 2020-02-21 DIAGNOSIS — Z23 Encounter for immunization: Secondary | ICD-10-CM | POA: Insufficient documentation

## 2020-02-21 NOTE — Progress Notes (Signed)
   Covid-19 Vaccination Clinic  Name:  Shelly [name]    MRN: [id number] DOB: 12/18/1941  02/21/2020  [name] was observed post Covid-19 immunization for 15 minutes without incidence. She was provided with Vaccine Information Sheet and instruction to access the V-Safe system.   [name] was instructed to call 911 with any severe reactions post vaccine: Marland Kitchen Difficulty breathing  . Swelling of your face and throat  . A fast heartbeat  . A bad rash all over your body  . Dizziness and weakness    Immunizations Administered    Name Date Dose VIS Date Route   Pfizer COVID-19 Vaccine 02/21/2020  5:08 PM 0.3 mL 12/03/2019 Intramuscular   Manufacturer: [place]   Lot: [id number]   [place]: [id number]

## 2020-05-23 ENCOUNTER — Ambulatory Visit (HOSPITAL_COMMUNITY): Payer: Medicare Other | Attending: Cardiology

## 2020-05-23 ENCOUNTER — Other Ambulatory Visit: Payer: Self-pay

## 2020-05-23 ENCOUNTER — Other Ambulatory Visit (HOSPITAL_COMMUNITY): Payer: Self-pay

## 2020-05-23 DIAGNOSIS — I2510 Atherosclerotic heart disease of native coronary artery without angina pectoris: Secondary | ICD-10-CM | POA: Diagnosis not present

## 2020-05-23 DIAGNOSIS — I5022 Chronic systolic (congestive) heart failure: Secondary | ICD-10-CM | POA: Diagnosis present

## 2020-05-23 DIAGNOSIS — I252 Old myocardial infarction: Secondary | ICD-10-CM

## 2020-05-23 NOTE — Discharge Instructions (Signed)

## 2020-05-24 ENCOUNTER — Ambulatory Visit (HOSPITAL_COMMUNITY)
Admission: RE | Admit: 2020-05-24 | Discharge: 2020-05-24 | Disposition: A | Discharge status: Home / Self Care | Payer: Medicare Other | Source: Ambulatory Visit | Attending: Nephrology | Admitting: Nephrology

## 2020-05-24 ENCOUNTER — Telehealth: Payer: Self-pay | Admitting: Cardiovascular Disease

## 2020-05-24 DIAGNOSIS — D631 Anemia in chronic kidney disease: Secondary | ICD-10-CM | POA: Insufficient documentation

## 2020-05-24 MED ORDER — SODIUM CHLORIDE 0.9 % IV SOLN
510.0000 mg | Freq: Once | INTRAVENOUS | Status: AC
  Administered 2020-05-24: 510 mg via INTRAVENOUS
  Filled 2020-05-24: qty 17

## 2020-05-24 NOTE — Telephone Encounter (Signed)
I spoke with patient and reviewed echo results with her.  She checks BP at home and will bring readings to office visit with Dr Liana Crocker on 6/22.  She reports a cough each morning. This is not new and is only in the AM.  She will discuss with Dr Acie Fredrickson at upcoming visit.

## 2020-05-24 NOTE — Telephone Encounter (Signed)
Patient returning Michelle's call from yesterday in regards to echo results.

## 2020-06-05 ENCOUNTER — Other Ambulatory Visit: Payer: Self-pay | Admitting: *Deleted

## 2020-06-05 MED ORDER — HYDRALAZINE HCL 25 MG PO TABS: 37.5000 mg | ORAL_TABLET | Freq: Two times a day (BID) | ORAL | 1 refills | Status: DC

## 2020-06-13 ENCOUNTER — Encounter: Payer: Self-pay | Admitting: Cardiovascular Disease

## 2020-06-13 ENCOUNTER — Ambulatory Visit (INDEPENDENT_AMBULATORY_CARE_PROVIDER_SITE_OTHER): Payer: Medicare Other | Admitting: Cardiovascular Disease

## 2020-06-13 ENCOUNTER — Other Ambulatory Visit: Payer: Self-pay

## 2020-06-13 VITALS — BP 130/64 | HR 68 | Ht 62.0 in | Wt 106.6 lb

## 2020-06-13 DIAGNOSIS — I2510 Atherosclerotic heart disease of native coronary artery without angina pectoris: Secondary | ICD-10-CM | POA: Diagnosis not present

## 2020-06-13 DIAGNOSIS — I739 Peripheral vascular disease, unspecified: Secondary | ICD-10-CM

## 2020-06-13 DIAGNOSIS — I5022 Chronic systolic (congestive) heart failure: Secondary | ICD-10-CM | POA: Diagnosis not present

## 2020-06-13 DIAGNOSIS — E785 Hyperlipidemia, unspecified: Secondary | ICD-10-CM

## 2020-06-13 DIAGNOSIS — I2109 ST elevation (STEMI) myocardial infarction involving other coronary artery of anterior wall: Secondary | ICD-10-CM

## 2020-06-13 DIAGNOSIS — E039 Hypothyroidism, unspecified: Secondary | ICD-10-CM | POA: Diagnosis not present

## 2020-06-13 DIAGNOSIS — I5181 Takotsubo syndrome: Secondary | ICD-10-CM

## 2020-06-13 NOTE — Progress Notes (Signed)
Cardiology Office Note:    Date:  06/13/2020   ID:  SHAKYRA MATTERA, DOB 15-Mar-1941, MRN [id number]  PCP:  Shon Baton, MD  Cardiologist:  Arman Loy  Electrophysiologist:  None   Referring MD: Shon Baton, MD   Chief Complaint  Patient presents with  . Coronary Artery Disease  . Congestive Heart Failure    History of Present Illness:    Shelly [name] is a 79 y.o. female with a hx of CHF. She was seen in the [facility] ER yesterday  am and was scheduled for an appt with the DOD the following day .  Echo on Sept. 4 revealed EF of 30-35%.   Grade 1 diastolic dysfunction.  Moderate pulmonary HTN with PA pressure of 56 mmHg. Mild - mod MR   We were asked to see her by Dr. Virgina Jock for further evaluation of sudden onset of rapid HR .   [place]  Woke up , could not breath  Felt weak.   Called EMS.   EMS did an ECG but HR had slowed by that time .   Came back to [name] to urgent care. .  ECG showed marked TWI ,  Felt fine,  Did not get admitted.   Echo was ordered and showed EF of 30-35%.  , moderate hypokinesis of anterior wall  PA pressure of 56 mmHg.  Was stared on Coreg 3. 125 BID   Has had some tightness in her chest for most of this summer. Really has not felt well Has been getting tired early . Has tightness in her chest with walking .   Would typically resolve when she stopped to rest.    She is had several episodes at night where she has very fast and rapid heart rate irregularities.  These are associated with intense chest pain and lightheadedness.  Clinically these could be consistent with ventricular tachycardia.  December 08, 2019:  Shelly [name] is seen today for follow-up visit.  When I saw her several months ago it appeared that she had had a recent anterior wall myocardial infarction and was still symptomatic.  She was in congestive heart failure.  We admitted her to the hospital and arrange for her to have a heart catheterization.   At heart cath she was found to have a diffusely diseased LAD with a moderate proximal stenosis and severe diffuse mid/distal disease.  The vessel was unfavorable for PCI.  She had mild nonobstructive disease in the right coronary artery and left circumflex artery and start on medical therapy.  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  The LV dysfunction seem to be out of proportion related to her degree of coronary artery disease.  Has improved on medical Rx.  No CP , ,   Breathing is good   Has cluadication of her right leg ( sees Dr. Ruta Hinds )   06/13/2020: Shelly [name] is seen for follow-up visit.  She has a history of an anterior wall myocardial infarction.  She had Takotsubo syndrome and had congestive heart failure. LVEF was originally 30 to 35%.  On medical therapy her EF has normalized and is now 55 to 60%.  Has some lingering right hip and thigh pain  Worse if she sits for a long time   Past Medical History:  Diagnosis Date  . Anxiety   .  Arthritis    right elbow  . 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 Breast cancer ([place]) 03/2010   left breast s/p lumpectomy; adjuvant radiation; on adjuvant homrnal therapy  . Chronic kidney disease    stage 3  . COPD (chronic obstructive pulmonary disease) ([place])   . Hay fever   . History of breast cancer 09/29/2012  . Hypertension   . Hypothyroidism   . Macular degeneration   . Migraine   . MVP (mitral valve prolapse)   . Osteopenia   . PONV (postoperative nausea and vomiting)   . Right arm fracture   . Sciatic pain   . Scoliosis     Past Surgical History:  Procedure Laterality Date  . APPENDECTOMY    . BREAST BIOPSY    . BREAST LUMPECTOMY    . CATARACT EXTRACTION Right   . COLONOSCOPY    . DILATATION & CURETTAGE/HYSTEROSCOPY WITH MYOSURE N/A 05/24/2016   Procedure: DILATATION &  CURETTAGE/HYSTEROSCOPY WITH MYOSURE;  Surgeon: Azucena Fallen, MD;  Location: [facility];  Service: Gynecology;  Laterality: N/A;  400 cc deficit  . DILATION AND CURETTAGE OF UTERUS     uterine polyps  . EYE SURGERY Right    retina  . HERNIA REPAIR    . LAPAROTOMY    . LEFT HEART CATH AND CORONARY ANGIOGRAPHY N/A 09/07/2019   Procedure: LEFT HEART CATH AND CORONARY ANGIOGRAPHY;  Surgeon: [name], MD;  Location: [facility];  Service: Cardiovascular;  Laterality: N/A;  . right arm surgery     due to fracture  . TONSILLECTOMY    . TUBAL LIGATION    . UNILATERAL SALPINGECTOMY Left   . UPPER GI ENDOSCOPY    . WISDOM TOOTH EXTRACTION      Current Medications: Current Meds  Medication Sig  . aspirin EC 81 MG tablet Take 81 mg by mouth daily.  Marland Kitchen atorvastatin (LIPITOR) 40 MG tablet Take 1 tablet (40 mg total) by mouth daily.  . Biotin 1 MG CAPS Take 1 mg by mouth daily.   Marland Kitchen buPROPion (WELLBUTRIN XL) 150 MG 24 hr tablet Take 150 mg by mouth every morning.   . carvedilol (COREG) 12.5 MG tablet Take 1 tablet (12.5 mg total) by mouth 2 (two) times daily with a meal.  . cetirizine (ZYRTEC) 10 MG tablet Take 10 mg by mouth daily.  . Cholecalciferol (VITAMIN D-3) 25 MCG (1000 UT) CAPS Take 1,000 Units by mouth daily.  . furosemide (LASIX) 20 MG tablet Take 20 mg by mouth every other day.   . hydrALAZINE (APRESOLINE) 25 MG tablet Take 1.5 tablets (37.5 mg total) by mouth 2 (two) times daily.  . isosorbide mononitrate (IMDUR) 30 MG 24 hr tablet Take 1 tablet (30 mg total) by mouth daily.  . Multiple Vitamins-Minerals (PRESERVISION AREDS PO) Take by mouth.  . SYNTHROID 50 MCG tablet Take 50 mcg by mouth daily before breakfast. Take one tablet (50 mcg)  by mouth Saturday and Sunday before breakfast. Take 1.5 tablets (75 mcg) M-F.  . [DISCONTINUED] Multiple Vitamins-Minerals (PRESERVISION/LUTEIN) CAPS Take 1 capsule by mouth every morning.      Allergies:   Morphine and related, Yellow dyes  (non-tartrazine), Amoxicillin, Eggs or egg-derived products, Penicillins, Pollen extract, and Lamictal [lamotrigine]   Social History   Socioeconomic History  . Marital status: Married    Spouse name: Not on file  . Number of children: Not on file  . Years of education: Not on file  .  Highest education level: Not on file  Occupational History  . Not on file  Tobacco Use  . Smoking status: Former Smoker    Packs/day: 0.50    Years: 25.00    Pack years: 12.50    Quit date: 08/07/1985    Years since quitting: 34.8  . Smokeless tobacco: Never Used  Substance and Sexual Activity  . Alcohol use: No    Alcohol/week: 1.0 standard drink    Types: 1 Glasses of wine per week  . Drug use: No  . Sexual activity: Yes    Birth control/protection: Post-menopausal  Other Topics Concern  . Not on file  Social History Narrative  . Not on file   Social Determinants of Health   Financial Resource Strain: Low Risk   . Difficulty of Paying Living Expenses: Not hard at all  Food Insecurity: No Food Insecurity  . Worried About Charity fundraiser in the Last Year: Never true  . Ran Out of Food in the Last Year: Never true  Transportation Needs: No Transportation Needs  . Lack of Transportation (Medical): No  . Lack of Transportation (Non-Medical): No  Physical Activity: Sufficiently Active  . Days of Exercise per Week: 5 days  . Minutes of Exercise per Session: 30 min  Stress: No Stress Concern Present  . Feeling of Stress : Only a little  Social Connections: Moderately Integrated  . Frequency of Communication with Friends and Family: More than three times a week  . Frequency of Social Gatherings with Friends and Family: Three times a week  . Attends Religious Services: More than 4 times per year  . Active Member of Clubs or Organizations: No  . Attends Archivist Meetings: Never  . Marital Status: Married     Family History: The patient's family history is not on  file.  ROS:   Please see the history of present illness.     All other systems reviewed and are negative.  EKGs/Labs/Other Studies Reviewed:    The following studies were reviewed today:   EKG: June 13, 2020: Normal sinus rhythm at 67.  Nonspecific ST and T wave changes.  Recent Labs: 08/17/2019: TSH 0.615 09/02/2019: Magnesium 2.2 09/03/2019: B Natriuretic Peptide 1,032.6 09/08/2019: Hemoglobin 10.0; Platelets 176 09/17/2019: BUN 41; Creatinine, Ser 1.98; Potassium 5.0; Sodium 141  Recent Lipid Panel    Component Value Date/Time   CHOL 164 09/05/2019 0011   TRIG 57 09/05/2019 0011   HDL 78 09/05/2019 0011   CHOLHDL 2.1 09/05/2019 0011   VLDL 11 09/05/2019 0011   LDLCALC 75 09/05/2019 0011    Physical Exam:    Physical Exam: Blood pressure 130/64, pulse 68, height 5\' 2"  (1.575 m), weight 106 lb 9.6 oz (48.4 kg), SpO2 99 %.  GEN:  Well nourished, well developed in no acute distress HEENT: Normal NECK: No JVD; No carotid bruits LYMPHATICS: No lymphadenopathy CARDIAC: RRR , no murmurs, rubs, gallops RESPIRATORY:  Clear to auscultation without rales, wheezing or rhonchi  ABDOMEN: Soft, non-tender, non-distended MUSCULOSKELETAL:  No edema; No deformity  SKIN: Warm and dry NEUROLOGIC:  Alert and oriented x 3    ASSESSMENT:    1. Chronic systolic heart failure ([place])   2. Coronary artery disease involving native coronary artery of native heart without angina pectoris   3. Hypothyroidism, unspecified type   4. PAD (peripheral artery disease) ([place])   5. Hyperlipidemia, unspecified hyperlipidemia type    PLAN:    In order of problems listed above:  1.  Coronary artery disease:    She has disease in her mid/distal LAD.  She is not having any episodes of angina.  2.  Takotsubo syndrome: She presented several months ago with markedly reduced LV function that seem to be out of proportion to her degree of coronary artery disease.  The echo can try to look the pattern was  consistent with Takotsubo syndrome.  Her EF is now normalized on medical therapy.  We will continue current medications.  3. PAD :   She has known peripheral arterial disease and follows up with Dr. Oneida Alar.  She is having some leg pain but this sounds more like a spinal/nerve issue than PAD.  4.  Chronic kidney disease: She will follow up with her primary medical doctor.  Take care  Medication Adjustments/Labs and Tests Ordered: Current medicines are reviewed at length with the patient today.  Concerns regarding medicines are outlined above.  Orders Placed This Encounter  Procedures  . EKG 12-Lead   No orders of the defined types were placed in this encounter.   Patient Instructions  Medication Instructions:  Your physician recommends that you continue on your current medications as directed. Please refer to the Current Medication list given to you today.  *If you need a refill on your cardiac medications before your next appointment, please call your pharmacy*   Lab Work: None Ordered If you have labs (blood work) drawn today and your tests are completely normal, you will receive your results only by: Marland Kitchen MyChart Message (if you have MyChart) OR . A paper copy in the mail If you have any lab test that is abnormal or we need to change your treatment, we will call you to review the results.   Testing/Procedures: None Ordered   Follow-Up: At [facility], you and your health needs are our priority.  As part of our continuing mission to provide you with exceptional heart care, we have created designated Provider Care Teams.  These Care Teams include your primary Cardiologist (physician) and Advanced Practice Providers (APPs -  Physician Assistants and Nurse Practitioners) who all work together to provide you with the care you need, when you need it.   Your next appointment:   7 month(s)  The format for your next appointment:   In Person  Provider:   You may see Mertie Moores, MD or one of the following Advanced Practice Providers on your designated Care Team:    Richardson Dopp, PA-C  Robbie Lis, [place]         Signed, Mertie Moores, MD  06/13/2020 3:39 PM    [place]

## 2020-06-13 NOTE — Patient Instructions (Addendum)
Medication Instructions:  Your physician recommends that you continue on your current medications as directed. Please refer to the Current Medication list given to you today.  *If you need a refill on your cardiac medications before your next appointment, please call your pharmacy*   Lab Work: None Ordered If you have labs (blood work) drawn today and your tests are completely normal, you will receive your results only by:  [place] (if you have MyChart) OR  A paper copy in the mail If you have any lab test that is abnormal or we need to change your treatment, we will call you to review the results.   Testing/Procedures: None Ordered   Follow-Up: At [facility], you and your health needs are our priority.  As part of our continuing mission to provide you with exceptional heart care, we have created designated Provider Care Teams.  These Care Teams include your primary Cardiologist (physician) and Advanced Practice Providers (APPs -  Physician Assistants and Nurse Practitioners) who all work together to provide you with the care you need, when you need it.   Your next appointment:   7 month(s)  The format for your next appointment:   In Person  Provider:   You may see Mertie Moores, MD or one of the following Advanced Practice Providers on your designated Care Team:    Richardson Dopp, PA-C  [place], [place]

## 2020-06-22 DIAGNOSIS — I2721 Secondary pulmonary arterial hypertension: Secondary | ICD-10-CM | POA: Insufficient documentation

## 2020-08-17 DIAGNOSIS — M25562 Pain in left knee: Secondary | ICD-10-CM | POA: Insufficient documentation

## 2020-09-26 ENCOUNTER — Other Ambulatory Visit: Payer: Self-pay

## 2020-09-26 DIAGNOSIS — I739 Peripheral vascular disease, unspecified: Secondary | ICD-10-CM

## 2020-09-28 ENCOUNTER — Ambulatory Visit (HOSPITAL_COMMUNITY)
Admission: RE | Admit: 2020-09-28 | Discharge: 2020-09-28 | Disposition: A | Discharge status: Home / Self Care | Payer: Medicare Other | Source: Ambulatory Visit | Attending: Vascular Surgery | Admitting: Vascular Surgery

## 2020-09-28 ENCOUNTER — Other Ambulatory Visit: Payer: Self-pay

## 2020-09-28 ENCOUNTER — Ambulatory Visit (INDEPENDENT_AMBULATORY_CARE_PROVIDER_SITE_OTHER): Payer: Medicare Other | Admitting: Physician Assistant

## 2020-09-28 VITALS — BP 133/55 | HR 60 | Temp 97.9°F | Resp 20 | Ht 62.0 in | Wt 107.4 lb

## 2020-09-28 DIAGNOSIS — I739 Peripheral vascular disease, unspecified: Secondary | ICD-10-CM

## 2020-09-28 NOTE — Progress Notes (Signed)
History of Present Illness:  Patient is a 79 y.o. year old female who presents for evaluation of claudication. She has a history of right LE claudication symptoms.  She has started a walking program.  She denise non healing wounds and rest pain.     She reports a recent left patella stress fracture and this is slowing down her physical activity.    She continues to take Crestor and ASA 81 mg daily.  Past Medical History:  Diagnosis Date  . Anxiety   . Arthritis    right elbow  . 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 Breast cancer ([place]) 03/2010   left breast s/p lumpectomy; adjuvant radiation; on adjuvant homrnal therapy  . Chronic kidney disease    stage 3  . COPD (chronic obstructive pulmonary disease) ([place])   . Hay fever   . History of breast cancer 09/29/2012  . Hypertension   . Hypothyroidism   . Macular degeneration   . Migraine   . MVP (mitral valve prolapse)   . Osteopenia   . PONV (postoperative nausea and vomiting)   . Right arm fracture   . Sciatic pain   . Scoliosis     Past Surgical History:  Procedure Laterality Date  . APPENDECTOMY    . BREAST BIOPSY    . BREAST LUMPECTOMY    . CATARACT EXTRACTION Right   . COLONOSCOPY    . DILATATION & CURETTAGE/HYSTEROSCOPY WITH MYOSURE N/A 05/24/2016   Procedure: DILATATION & CURETTAGE/HYSTEROSCOPY WITH MYOSURE;  Surgeon: Azucena Fallen, MD;  Location: [facility];  Service: Gynecology;  Laterality: N/A;  400 cc deficit  . DILATION AND CURETTAGE OF UTERUS     uterine polyps  . EYE SURGERY Right    retina  . HERNIA REPAIR    . LAPAROTOMY    . LEFT HEART CATH AND CORONARY ANGIOGRAPHY N/A 09/07/2019   Procedure: LEFT HEART CATH AND CORONARY ANGIOGRAPHY;  Surgeon: [name], MD;  Location: [facility];  Service: Cardiovascular;  Laterality: N/A;  . right arm surgery     due to fracture  . TONSILLECTOMY    . TUBAL LIGATION    . UNILATERAL SALPINGECTOMY Left   . UPPER GI ENDOSCOPY    . WISDOM TOOTH  EXTRACTION      ROS:   General:  No weight loss, Fever, chills  HEENT: No recent headaches, no nasal bleeding, no visual changes, no sore throat  Neurologic: No dizziness, blackouts, seizures. No recent symptoms of stroke or mini- stroke. No recent episodes of slurred speech, or temporary blindness.  Cardiac: No recent episodes of chest pain/pressure, no shortness of breath at rest.  No shortness of breath with exertion.  Denies history of atrial fibrillation or irregular heartbeat  Vascular: No history of rest pain in feet.  No history of claudication.  No history of non-healing ulcer, No history of DVT   Pulmonary: No home oxygen, no productive cough, no hemoptysis,  No asthma or wheezing  Musculoskeletal:  [ ]  Arthritis, [ ]  Low back pain,  [ ]  Joint pain  Hematologic:No history of hypercoagulable state.  No history of easy bleeding.  No history of anemia  Gastrointestinal: No hematochezia or melena,  No gastroesophageal reflux, no trouble swallowing  Urinary: [ ]  chronic Kidney disease, [ ]  on HD - [ ]  MWF or [ ]  TTHS, [ ]  Burning with urination, [ ]  Frequent urination, [ ]  Difficulty urinating;   Skin: No rashes  Psychological: No history of anxiety,  No history of depression  Social History Social History   Tobacco Use  . Smoking status: Former Smoker    Packs/day: 0.50    Years: 25.00    Pack years: 12.50    Quit date: 08/07/1985    Years since quitting: 35.1  . Smokeless tobacco: Never Used  Substance Use Topics  . Alcohol use: No    Alcohol/week: 1.0 standard drink    Types: 1 Glasses of wine per week  . Drug use: No    Family History No family history on file.  Allergies  Allergies  Allergen Reactions  . Morphine And Related Nausea And Vomiting  . Yellow Dyes (Non-Tartrazine) Itching, Rash and Other (See Comments)    SEVERE RASH THAT COVERED THE BACK (lasted 3 months)  . Amoxicillin Diarrhea and Other (See Comments)    Fever spiked at close to 105  F Has patient had a PCN reaction causing immediate rash, facial/tongue/throat swelling, SOB or lightheadedness with hypotension: Yes Has patient had a PCN reaction causing severe rash involving mucus membranes or skin necrosis: Yes Has patient had a PCN reaction that required hospitalization Yes Has patient had a PCN reaction occurring within the last 10 years: No If all of the above answers are "NO", then may proceed with Cephalosporin use.    . Eggs Or Egg-Derived Products Swelling and Other (See Comments)    Eyes, lips, and mouth became swollen- no shortness of breath, however  . Penicillins Diarrhea and Other (See Comments)    Febrile and diarrhea Has patient had a PCN reaction causing immediate rash, facial/tongue/throat swelling, SOB or lightheadedness with hypotension: Yes Has patient had a PCN reaction causing severe rash involving mucus membranes or skin necrosis: Yes Has patient had a PCN reaction that required hospitalization Yes Has patient had a PCN reaction occurring within the last 10 years: No If all of the above answers are "NO", then may proceed with Cephalosporin use.   . Pollen Extract Other (See Comments)    Itchy eyes, runny nose and congestion  . Lamictal [Lamotrigine] Rash     Current Outpatient Medications  Medication Sig Dispense Refill  . aspirin EC 81 MG tablet Take 81 mg by mouth daily.    Marland Kitchen buPROPion (WELLBUTRIN XL) 150 MG 24 hr tablet Take 150 mg by mouth every morning.     . carvedilol (COREG) 12.5 MG tablet Take 1 tablet (12.5 mg total) by mouth 2 (two) times daily with a meal. 180 tablet 3  . cetirizine (ZYRTEC) 10 MG tablet Take 10 mg by mouth daily.    . Cholecalciferol (VITAMIN D-3) 25 MCG (1000 UT) CAPS Take 1,000 Units by mouth daily.    . furosemide (LASIX) 20 MG tablet Take 20 mg by mouth every other day.     . hydrALAZINE (APRESOLINE) 25 MG tablet Take 1.5 tablets (37.5 mg total) by mouth 2 (two) times daily. 270 tablet 1  . isosorbide  mononitrate (IMDUR) 30 MG 24 hr tablet Take 1 tablet (30 mg total) by mouth daily. 90 tablet 3  . Multiple Vitamins-Minerals (PRESERVISION AREDS PO) Take by mouth.    . rosuvastatin (CRESTOR) 40 MG tablet Crestor 40 mg tablet  Take 1 tablet every day by oral route.    Marland Kitchen SYNTHROID 50 MCG tablet Take 50 mcg by mouth daily before breakfast. Take one tablet (50 mcg)  by mouth Saturday and Sunday before breakfast. Take 1.5 tablets (75 mcg) M-F.  No current facility-administered medications for this visit.    Physical Examination  Vitals:   09/28/20 1026  BP: (!) 133/55  Pulse: 60  Resp: 20  Temp: 97.9 F (36.6 C)  TempSrc: Temporal  SpO2: 99%  Weight: 107 lb 6.4 oz (48.7 kg)  Height: 5\' 2"  (1.575 m)    Body mass index is 19.64 kg/m.  General:  Alert and oriented, no acute distress HEENT: Normal Neck: positive bruit verses heart murmer B carotids Pulmonary: Clear to auscultation bilaterally Cardiac: Regular Rate and Rhythm with murmur Abdomen: Soft, non-tender, non-distended, no mass, no scars Skin: No rash Extremity Pulses:  2+ radial, brachial, femoral, left LE dorsalis pedis, posterior tibial pulses bilaterally Musculoskeletal: No deformity or edema  Neurologic: Upper and lower extremity motor 5/5 and symmetric  DATA:    ABI Findings:  +---------+------------------+-----+----------+--------+  Right  Rt Pressure (mmHg)IndexWaveform Comment   +---------+------------------+-----+----------+--------+  Brachial 149                      +---------+------------------+-----+----------+--------+  PTA   100        0.65 biphasic       +---------+------------------+-----+----------+--------+  DP    98        0.64 monophasic      +---------+------------------+-----+----------+--------+  Great Toe78        0.51            +---------+------------------+-----+----------+--------+    +---------+------------------+-----+---------+-------+  Left   Lt Pressure (mmHg)IndexWaveform Comment  +---------+------------------+-----+---------+-------+  Brachial 153                      +---------+------------------+-----+---------+-------+  PTA   145        0.95 triphasic      +---------+------------------+-----+---------+-------+  DP    151        0.99 triphasic      +---------+------------------+-----+---------+-------+  Great Toe139        0.91            +---------+------------------+-----+---------+-------+   +-------+-----------+-----------+------------+------------+  ABI/TBIToday's ABIToday's TBIPrevious ABIPrevious TBI  +-------+-----------+-----------+------------+------------+  Right 0.65    0.51    0.63    0.42      +-------+-----------+-----------+------------+------------+  Left  0.99    0.91    1.00    0.96      +-------+-----------+-----------+------------+------------+    Summary:  Right: Resting right ankle-brachial index indicates moderate right lower  extremity arterial disease. The right toe-brachial index is abnormal.   Left: Resting left ankle-brachial index is within normal range. No  evidence of significant left lower extremity arterial disease. The left  toe-brachial index is normal.   ASSESSMENT:  PAD with symptomatic claudication.  She has started a walking program and has benefitted with decreased claudication symptoms on a daily basis. Carotid bruit without history of symptoms of stroke or TIA.      PLAN:  F/U in 1 year for repeat ABI's.  If she developed new symptoms or returned symptoms of claudication she will call sooner.    I will order a carotid duplex for a baseline since I could not distinguish a heart murmer from a bruit on exam today.  We can call her with the result of her carotid duplex.      Continue ASA and Crestor daily.  She will resume her walking program as she tolerates once her left patella stress fracture heals.  Roxy Horseman PA-C [facility] Office: [phone]  MD in clinic  [place]

## 2020-10-02 ENCOUNTER — Other Ambulatory Visit: Payer: Self-pay

## 2020-10-02 DIAGNOSIS — R0989 Other specified symptoms and signs involving the circulatory and respiratory systems: Secondary | ICD-10-CM

## 2020-10-03 ENCOUNTER — Other Ambulatory Visit: Payer: Self-pay

## 2020-10-03 MED ORDER — ISOSORBIDE MONONITRATE ER 30 MG PO TB24: 30.0000 mg | ORAL_TABLET | Freq: Every day | ORAL | 2 refills | Status: DC

## 2020-10-05 ENCOUNTER — Ambulatory Visit (HOSPITAL_COMMUNITY)
Admission: RE | Admit: 2020-10-05 | Discharge: 2020-10-05 | Disposition: A | Discharge status: Home / Self Care | Payer: Medicare Other | Source: Ambulatory Visit | Attending: Vascular Surgery | Admitting: Vascular Surgery

## 2020-10-05 ENCOUNTER — Other Ambulatory Visit: Payer: Self-pay

## 2020-10-05 DIAGNOSIS — R0989 Other specified symptoms and signs involving the circulatory and respiratory systems: Secondary | ICD-10-CM | POA: Diagnosis not present

## 2020-10-12 ENCOUNTER — Other Ambulatory Visit: Payer: Self-pay

## 2020-10-12 MED ORDER — CARVEDILOL 12.5 MG PO TABS: 12.5000 mg | ORAL_TABLET | Freq: Two times a day (BID) | ORAL | 2 refills | Status: DC

## 2020-11-12 IMAGING — DX DG CHEST 1V PORT
1 series · 1 of 1 positions shown · non-contrast
Comparison: 04/09/2010

CLINICAL DATA: Cough.

EXAM:
PORTABLE CHEST 1 VIEW

[chest]
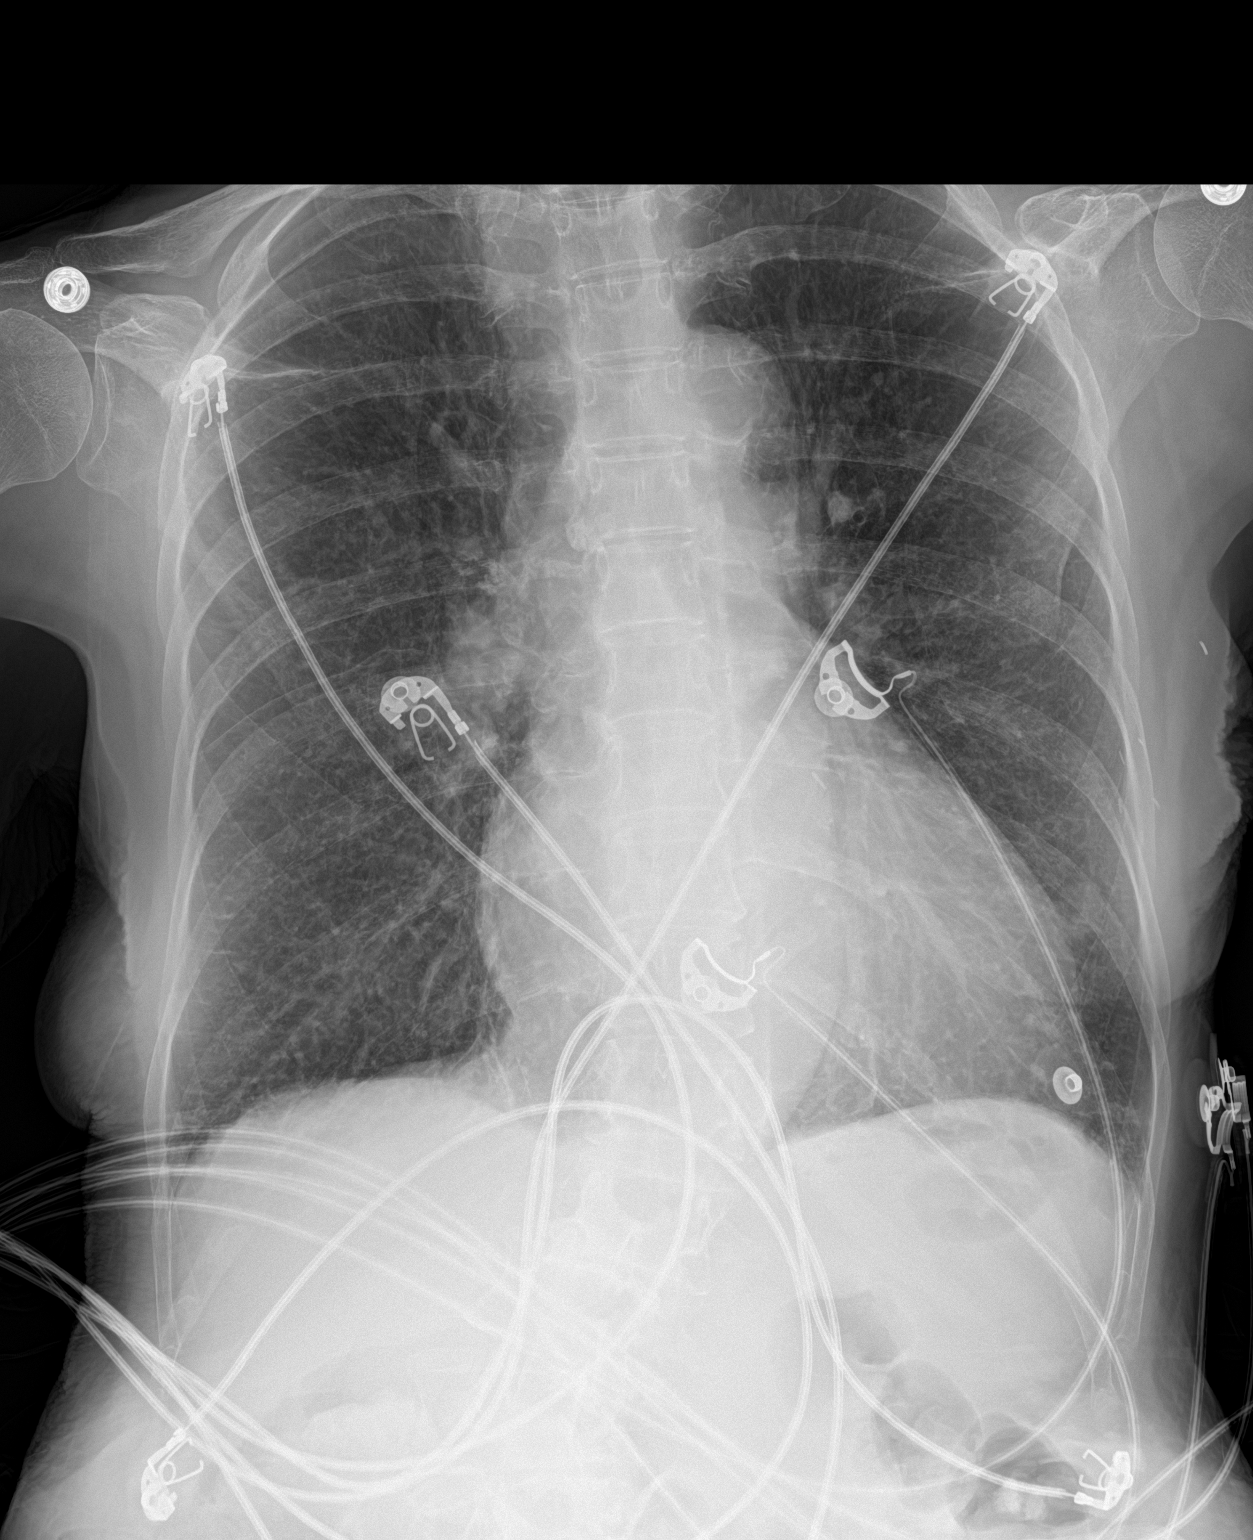

[1 of 1 positions shown; findings below may reference images not displayed]

FINDINGS: Prominent but normal range heart size for technique. Negative aortic
contours.

Interstitial coarsening compared to prior with a few Ker[place] lines
suggested on the right. No pleural effusion or air bronchogram.

Left lumpectomy changes.
IMPRESSION: Mild interstitial coarsening compared to prior which could be
congestive or bronchitic.

## 2020-11-13 IMAGING — CR DG CHEST 2V
2 series · 2 of 2 positions shown · non-contrast
Comparison: 09/02/2019 and prior radiographs

CLINICAL DATA: Acute chest pain.

EXAM:
CHEST - 2 VIEW

[chest pa]
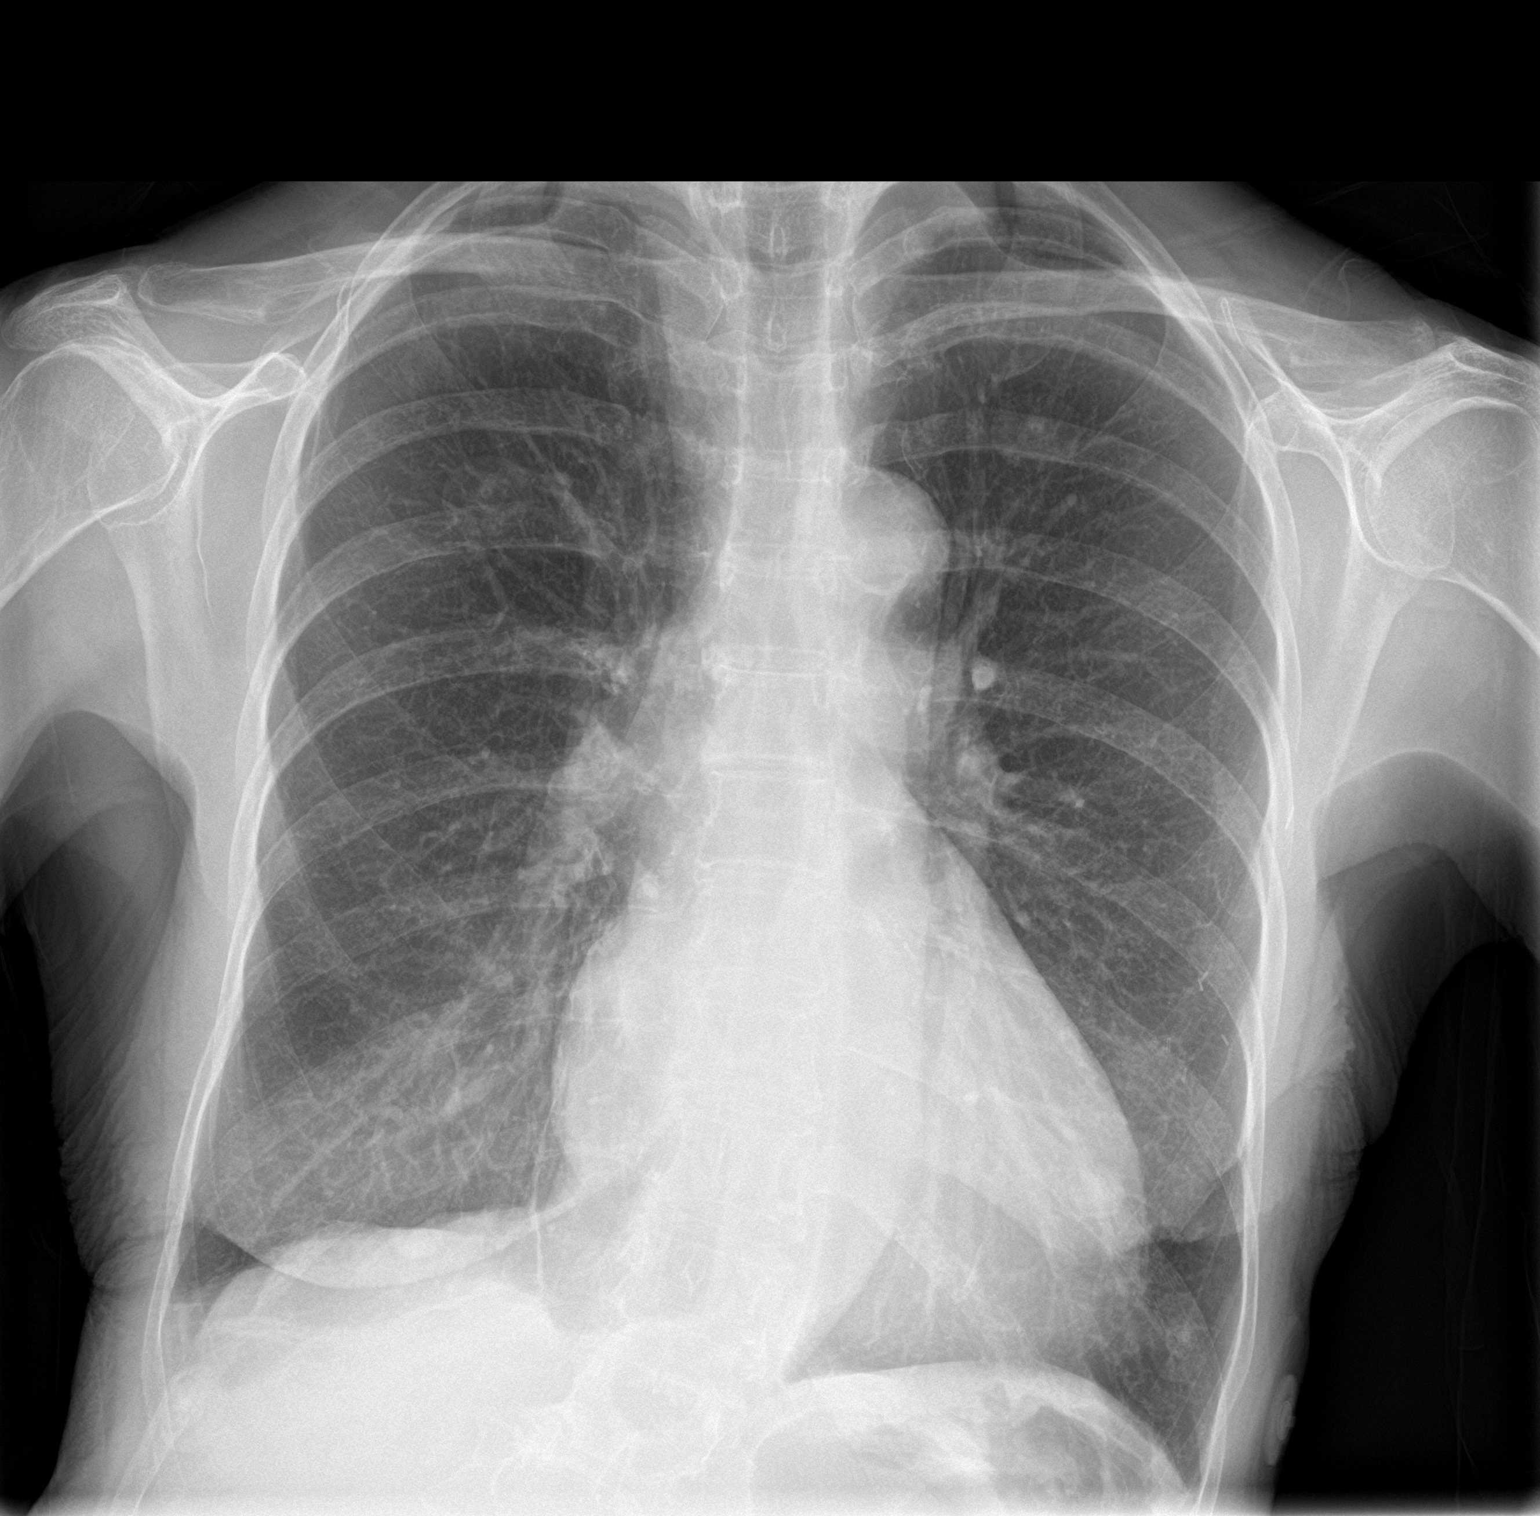

[chest lat]
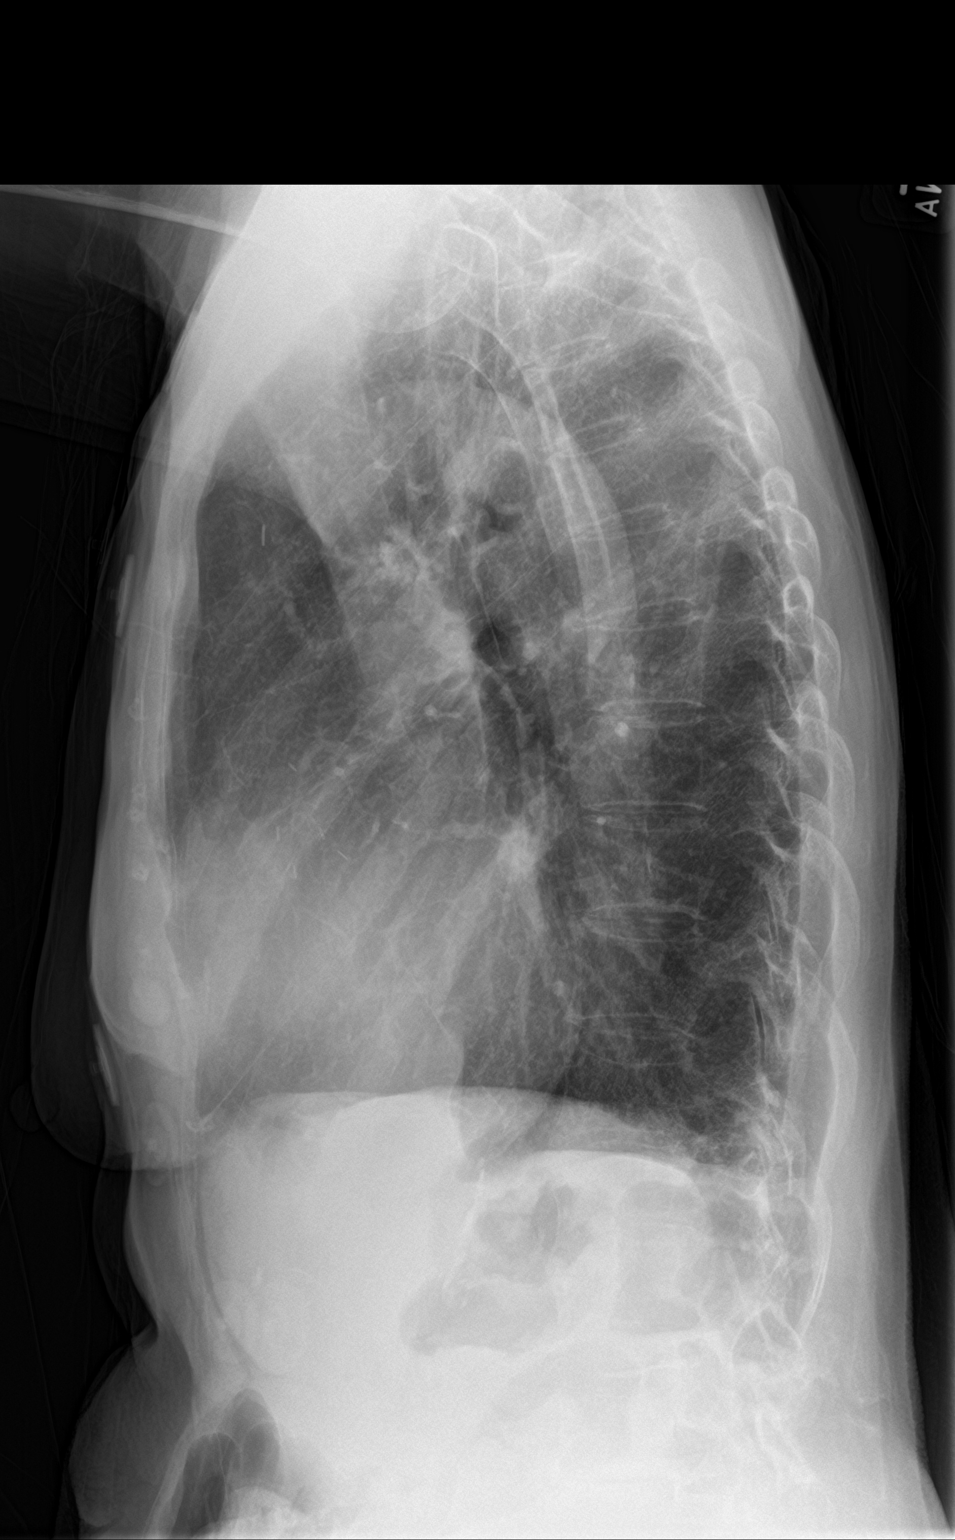

[2 of 2 positions shown; findings below may reference images not displayed]

FINDINGS: UPPER limits normal heart size again noted.

There is no evidence of focal airspace disease, pulmonary edema,
suspicious pulmonary nodule/mass, pleural effusion, or pneumothorax.

LEFT breast surgical changes again noted.

No acute bony abnormalities are identified.
IMPRESSION: No active cardiopulmonary disease.

## 2020-11-22 NOTE — Progress Notes (Signed)
   Patient was seen by me on 09/28/2020 I heard a bruit on exam and ordered a carotid duplex.  The duple was negative for ICA stenosis B.     Right Carotid Findings:  +----------+--------+--------+--------+------------------+--------+       PSV cm/sEDV cm/sStenosisPlaque DescriptionComments  +----------+--------+--------+--------+------------------+--------+  CCA Prox 62   16                      +----------+--------+--------+--------+------------------+--------+  CCA Distal67   11                      +----------+--------+--------+--------+------------------+--------+  ICA Prox 53   18   1-39%  heterogenous         +----------+--------+--------+--------+------------------+--------+  ICA Mid  121   32                tortuous  +----------+--------+--------+--------+------------------+--------+  ICA Distal141   46                      +----------+--------+--------+--------+------------------+--------+  ECA    230   12       heterogenous         +----------+--------+--------+--------+------------------+--------+   +----------+--------+-------+----------------+-------------------+       PSV cm/sEDV cmsDescribe    Arm Pressure (mmHG)  +----------+--------+-------+----------------+-------------------+  [id number]       Multiphasic, WNL            +----------+--------+-------+----------------+-------------------+   +---------+--------+--+--------+--+---------+  VertebralPSV cm/s67EDV cm/s21Antegrade  +---------+--------+--+--------+--+---------+      Left Carotid Findings:  +----------+--------+--------+--------+------------------+--------+       PSV cm/sEDV cm/sStenosisPlaque DescriptionComments    +----------+--------+--------+--------+------------------+--------+  CCA Prox 74   16                      +----------+--------+--------+--------+------------------+--------+  CCA Distal69   17                      +----------+--------+--------+--------+------------------+--------+  ICA Prox 75   23   1-39%  heterogenous         +----------+--------+--------+--------+------------------+--------+  ICA Mid  110   25                tortuous  +----------+--------+--------+--------+------------------+--------+  ICA Distal65   21                      +----------+--------+--------+--------+------------------+--------+  ECA    96   10                      +----------+--------+--------+--------+------------------+--------+   +----------+--------+--------+----------------+-------------------+       PSV cm/sEDV cm/sDescribe    Arm Pressure (mmHG)  +----------+--------+--------+----------------+-------------------+  [id number]       Multiphasic, WNL            +----------+--------+--------+----------------+-------------------+   +---------+--------+--+--------+--+---------+  VertebralPSV cm/s71EDV cm/s24Antegrade  +---------+--------+--+--------+--+---------+   Summary:  Right Carotid: Velocities in the right ICA are consistent with a 1-39%  stenosis.   Left Carotid: Velocities in the left ICA are consistent with a 1-39%  stenosis.   I called her with the results.  She has symptoms of claudication that have improved with a walking program.  She will f/u in 1 year for repeat ABI's  Roxy Horseman PA-C

## 2020-12-04 ENCOUNTER — Other Ambulatory Visit: Payer: Self-pay | Admitting: Cardiovascular Disease

## 2020-12-25 DIAGNOSIS — S82143A Displaced bicondylar fracture of unspecified tibia, initial encounter for closed fracture: Secondary | ICD-10-CM | POA: Insufficient documentation

## 2021-01-31 ENCOUNTER — Other Ambulatory Visit: Payer: Self-pay | Admitting: Cardiovascular Disease

## 2021-02-07 ENCOUNTER — Other Ambulatory Visit: Payer: Self-pay

## 2021-02-07 MED ORDER — ATORVASTATIN CALCIUM 40 MG PO TABS: 40.0000 mg | ORAL_TABLET | Freq: Every day | ORAL | 12 refills | Status: DC

## 2021-02-09 ENCOUNTER — Encounter: Payer: Self-pay | Admitting: Cardiovascular Disease

## 2021-02-09 ENCOUNTER — Other Ambulatory Visit: Payer: Self-pay

## 2021-02-09 ENCOUNTER — Ambulatory Visit: Payer: Medicare Other | Admitting: Cardiovascular Disease

## 2021-02-09 VITALS — BP 118/58 | HR 63 | Ht 62.0 in | Wt 105.0 lb

## 2021-02-09 DIAGNOSIS — I5181 Takotsubo syndrome: Secondary | ICD-10-CM

## 2021-02-09 DIAGNOSIS — I5043 Acute on chronic combined systolic (congestive) and diastolic (congestive) heart failure: Secondary | ICD-10-CM | POA: Diagnosis not present

## 2021-02-09 NOTE — Progress Notes (Signed)
Cardiology Office Note:    Date:  02/09/2021   ID:  Shelly [name], DOB 09-08-1941, MRN [id number]  PCP:  Shon Baton, MD  Cardiologist:  Karsyn Rochin  Electrophysiologist:  None   Referring MD: Shon Baton, MD   Chief Complaint  Patient presents with  . Congestive Heart Failure    History of Present Illness:    Shelly [name] is a 80 y.o. female with a hx of CHF. She was seen in the [facility] ER yesterday  am and was scheduled for an appt with the DOD the following day .  Echo on Sept. 4 revealed EF of 30-35%.   Grade 1 diastolic dysfunction.  Moderate pulmonary HTN with PA pressure of 56 mmHg. Mild - mod MR   We were asked to see her by Dr. Virgina Jock for further evaluation of sudden onset of rapid HR .   [place]  Woke up , could not breath  Felt weak.   Called EMS.   EMS did an ECG but HR had slowed by that time .   Came back to [name] to urgent care. .  ECG showed marked TWI ,  Felt fine,  Did not get admitted.   Echo was ordered and showed EF of 30-35%.  , moderate hypokinesis of anterior wall  PA pressure of 56 mmHg.  Was stared on Coreg 3. 125 BID   Has had some tightness in her chest for most of this summer. Really has not felt well Has been getting tired early . Has tightness in her chest with walking .   Would typically resolve when she stopped to rest.    She is had several episodes at night where she has very fast and rapid heart rate irregularities.  These are associated with intense chest pain and lightheadedness.  Clinically these could be consistent with ventricular tachycardia.  December 08, 2019:  Shelly [name] is seen today for follow-up visit.  When I saw her several months ago it appeared that she had had a recent anterior wall myocardial infarction and was still symptomatic.  She was in congestive heart failure.  We admitted her to the hospital and arrange for her to have a heart catheterization.  At heart cath she was found  to have a diffusely diseased LAD with a moderate proximal stenosis and severe diffuse mid/distal disease.  The vessel was unfavorable for PCI.  She had mild nonobstructive disease in the right coronary artery and left circumflex artery and start on medical therapy.  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  The LV dysfunction seem to be out of proportion related to her degree of coronary artery disease.  Has improved on medical Rx.  No CP , ,   Breathing is good   Has cluadication of her right leg ( sees Dr. Ruta Hinds )   06/13/2020: Jazzie is seen for follow-up visit.  She has a history of an anterior wall myocardial infarction.  She had Takotsubo syndrome and had congestive heart failure. LVEF was originally 30 to 35%.  On medical therapy her EF has normalized and is now 55 to 60%.  Has some lingering right hip and thigh pain  Worse if she sits for a long time   Feb. 18, 2022: Shelly [name] is seen today for follow up of her Takotsubo syndrome  LV functin has normalized  Has continue r leg pain .  Has seen Dr. Ronnald Ramp ( neurosurgery )   Past Medical History:  Diagnosis Date  . Anxiety   . Arthritis    right elbow  . 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 Breast cancer ([place]) 03/2010   left breast s/p lumpectomy; adjuvant radiation; on adjuvant homrnal therapy  . Chronic kidney disease    stage 3  . COPD (chronic obstructive pulmonary disease) ([place])   . Hay fever   . History of breast cancer 09/29/2012  . Hypertension   . Hypothyroidism   . Macular degeneration   . Migraine   . MVP (mitral valve prolapse)   . Osteopenia   . PONV (postoperative nausea and vomiting)   . Right arm fracture   . Sciatic pain   . Scoliosis     Past Surgical History:  Procedure Laterality Date  . APPENDECTOMY    . BREAST BIOPSY    . BREAST LUMPECTOMY    . CATARACT  EXTRACTION Right   . COLONOSCOPY    . DILATATION & CURETTAGE/HYSTEROSCOPY WITH MYOSURE N/A 05/24/2016   Procedure: DILATATION & CURETTAGE/HYSTEROSCOPY WITH MYOSURE;  Surgeon: Azucena Fallen, MD;  Location: [facility];  Service: Gynecology;  Laterality: N/A;  400 cc deficit  . DILATION AND CURETTAGE OF UTERUS     uterine polyps  . EYE SURGERY Right    retina  . HERNIA REPAIR    . LAPAROTOMY    . LEFT HEART CATH AND CORONARY ANGIOGRAPHY N/A 09/07/2019   Procedure: LEFT HEART CATH AND CORONARY ANGIOGRAPHY;  Surgeon: [name], MD;  Location: [place] [facility];  Service: Cardiovascular;  Laterality: N/A;  . right arm surgery     due to fracture  . TONSILLECTOMY    . TUBAL LIGATION    . UNILATERAL SALPINGECTOMY Left   . UPPER GI ENDOSCOPY    . WISDOM TOOTH EXTRACTION      Current Medications: Current Meds  Medication Sig  . aspirin EC 81 MG tablet Take 81 mg by mouth daily.  Marland Kitchen atorvastatin (LIPITOR) 40 MG tablet Take 1 tablet (40 mg total) by mouth daily.  Marland Kitchen buPROPion (WELLBUTRIN XL) 150 MG 24 hr tablet Take 150 mg by mouth every morning.   . carvedilol (COREG) 12.5 MG tablet Take 1 tablet (12.5 mg total) by mouth 2 (two) times daily with a meal.  . cetirizine (ZYRTEC) 10 MG tablet Take 10 mg by mouth daily.  . Cholecalciferol (VITAMIN D-3) 25 MCG (1000 UT) CAPS Take 1,000 Units by mouth daily.  . furosemide (LASIX) 20 MG tablet Take 20 mg by mouth 2 (two) times a week.  . hydrALAZINE (APRESOLINE) 25 MG tablet TAKE ONE AND ONE-HALF TABLET TWICE DAILY  . isosorbide mononitrate (IMDUR) 30 MG 24 hr tablet Take 1 tablet (30 mg total) by mouth daily.  . Multiple Vitamins-Minerals (PRESERVISION AREDS PO) Take by mouth.  . SYNTHROID 50 MCG tablet Take 50 mcg by mouth daily before breakfast. Take one tablet (50 mcg)  by mouth Saturday and Sunday before breakfast. Take 1.5 tablets (75 mcg) M-F.     Allergies:   Morphine and related, Yellow dyes (non-tartrazine), Amoxicillin, Eggs or egg-derived  products, Penicillins, Pollen extract, and Lamictal [lamotrigine]   Social History   Socioeconomic History  . Marital status: Married    Spouse name: Not on file  . Number of children: Not on file  . Years of education: Not on file  .  Highest education level: Not on file  Occupational History  . Not on file  Tobacco Use  . Smoking status: Former Smoker    Packs/day: 0.50    Years: 25.00    Pack years: 12.50    Quit date: 08/07/1985    Years since quitting: 35.5  . Smokeless tobacco: Never Used  Substance and Sexual Activity  . Alcohol use: No    Alcohol/week: 1.0 standard drink    Types: 1 Glasses of wine per week  . Drug use: No  . Sexual activity: Yes    Birth control/protection: Post-menopausal  Other Topics Concern  . Not on file  Social History Narrative  . Not on file   Social Determinants of Health   Financial Resource Strain: Not on file  Food Insecurity: Not on file  Transportation Needs: Not on file  Physical Activity: Not on file  Stress: Not on file  Social Connections: Not on file     Family History: The patient's family history is not on file.  ROS:   Please see the history of present illness.     All other systems reviewed and are negative.  EKGs/Labs/Other Studies Reviewed:    The following studies were reviewed today:    Recent Labs: No results found for requested labs within last 8760 hours.  Recent Lipid Panel    Component Value Date/Time   CHOL 164 09/05/2019 0011   TRIG 57 09/05/2019 0011   HDL 78 09/05/2019 0011   CHOLHDL 2.1 09/05/2019 0011   VLDL 11 09/05/2019 0011   LDLCALC 75 09/05/2019 0011    Physical Exam:    Physical Exam: Blood pressure (!) 118/58, pulse 63, height 5\' 2"  (1.575 m), weight 105 lb (47.6 kg), SpO2 98 %.  GEN:  Well nourished, well developed in no acute distress HEENT: Normal NECK: No JVD; No carotid bruits LYMPHATICS: No lymphadenopathy CARDIAC: RRR , no murmurs, rubs, gallops RESPIRATORY:  Clear  to auscultation without rales, wheezing or rhonchi  ABDOMEN: Soft, non-tender, non-distended MUSCULOSKELETAL:  No edema; No deformity  SKIN: Warm and dry NEUROLOGIC:  Alert and oriented x 3   EKG: June 13, 2020: Normal sinus rhythm at 67.  Nonspecific ST and T wave changes.  ASSESSMENT:    1. Takotsubo cardiomyopathy    PLAN:       1.  Coronary artery disease:     Minimal CAD .   No angina . Her CHF is out of proportion to her mild CAD   2.  Takotsubo syndrome:  Her LV function is normalized  Cont meds.    3. PAD :   She has known peripheral arterial disease and follows up with Dr. Oneida Alar.     4.  Chronic kidney disease:   Followed by Dr. Virgina Jock   Medication Adjustments/Labs and Tests Ordered: Current medicines are reviewed at length with the patient today.  Concerns regarding medicines are outlined above.  Orders Placed This Encounter  Procedures  . EKG 12-Lead   No orders of the defined types were placed in this encounter.   Patient Instructions  Medication Instructions:  Your physician recommends that you continue on your current medications as directed. Please refer to the Current Medication list given to you today.  *If you need a refill on your cardiac medications before your next appointment, please call your pharmacy*   Lab Work: none If you have labs (blood work) drawn today and your tests are completely normal, you will receive your results only by: Marland Kitchen  MyChart Message (if you have MyChart) OR . A paper copy in the mail If you have any lab test that is abnormal or we need to change your treatment, we will call you to review the results.   Testing/Procedures: none   Follow-Up: At [facility], you and your health needs are our priority.  As part of our continuing mission to provide you with exceptional heart care, we have created designated Provider Care Teams.  These Care Teams include your primary Cardiologist (physician) and Advanced Practice  Providers (APPs -  Physician Assistants and Nurse Practitioners) who all work together to provide you with the care you need, when you need it.  We recommend signing up for the patient portal called "MyChart".  Sign up information is provided on this After Visit Summary.  MyChart is used to connect with patients for Virtual Visits (Telemedicine).  Patients are able to view lab/test results, encounter notes, upcoming appointments, etc.  Non-urgent messages can be sent to your provider as well.   To learn more about what you can do with MyChart, go to [email or web address].    Your next appointment:   6 month(s)  The format for your next appointment:   In Person  Provider:   You will see one of the following Advanced Practice Providers on your designated Care Team:    Richardson Dopp, PA-C  Robbie Lis, [place]      Signed, Mertie Moores, MD  02/09/2021 11:57 AM    [place]

## 2021-02-09 NOTE — Patient Instructions (Signed)
Medication Instructions:  Your physician recommends that you continue on your current medications as directed. Please refer to the Current Medication list given to you today. *If you need a refill on your cardiac medications before your next appointment, please call your pharmacy*   Lab Work: none If you have labs (blood work) drawn today and your tests are completely normal, you will receive your results only by: . MyChart Message (if you have MyChart) OR . A paper copy in the mail If you have any lab test that is abnormal or we need to change your treatment, we will call you to review the results.   Testing/Procedures: none   Follow-Up: At [facility], you and your health needs are our priority.  As part of our continuing mission to provide you with exceptional heart care, we have created designated Provider Care Teams.  These Care Teams include your primary Cardiologist (physician) and Advanced Practice Providers (APPs -  Physician Assistants and Nurse Practitioners) who all work together to provide you with the care you need, when you need it.  We recommend signing up for the patient portal called "MyChart".  Sign up information is provided on this After Visit Summary.  MyChart is used to connect with patients for Virtual Visits (Telemedicine).  Patients are able to view lab/test results, encounter notes, upcoming appointments, etc.  Non-urgent messages can be sent to your provider as well.   To learn more about what you can do with MyChart, go to [email or web address].    Your next appointment:   6 month(s)  The format for your next appointment:   In Person  Provider:   You will see one of the following Advanced Practice Providers on your designated Care Team:    Scott Weaver, PA-C  Vin Bhagat, PA-C    

## 2021-05-30 ENCOUNTER — Encounter: Payer: Self-pay | Admitting: Podiatry

## 2021-05-30 ENCOUNTER — Ambulatory Visit: Payer: Medicare Other | Admitting: Podiatry

## 2021-05-30 ENCOUNTER — Other Ambulatory Visit: Payer: Self-pay

## 2021-05-30 DIAGNOSIS — L603 Nail dystrophy: Secondary | ICD-10-CM | POA: Diagnosis not present

## 2021-05-30 DIAGNOSIS — M76899 Other specified enthesopathies of unspecified lower limb, excluding foot: Secondary | ICD-10-CM | POA: Insufficient documentation

## 2021-05-30 DIAGNOSIS — M1990 Unspecified osteoarthritis, unspecified site: Secondary | ICD-10-CM | POA: Insufficient documentation

## 2021-05-30 DIAGNOSIS — M2042 Other hammer toe(s) (acquired), left foot: Secondary | ICD-10-CM | POA: Diagnosis not present

## 2021-05-30 DIAGNOSIS — R627 Adult failure to thrive: Secondary | ICD-10-CM | POA: Insufficient documentation

## 2021-05-30 DIAGNOSIS — M5416 Radiculopathy, lumbar region: Secondary | ICD-10-CM | POA: Insufficient documentation

## 2021-05-30 NOTE — Progress Notes (Signed)
This patient returns to my office for at risk foot care.  This patient requires this care by a professional since this patient will be at risk due to having PVD legs and CKD and PAD.Marland Kitchen   She says she was evaluated by vascular who diagnosed  PVD.    This patient is concerned about her big toenails both feet.  Both nails are separated from her nail bed.  She says the left hallux nail was removed over 2 years ago and has regrown only attached at her cuticle.  She is unaware why  the nail right big toe is unattached to her nail bed.  She denies drainage .  She presents to the office for evaluation of both big toenails.  This patient presents for at risk foot care today.  General Appearance  Alert, conversant and in no acute stress.  Vascular  Dorsalis pedis and posterior tibial  pulses are palpable  bilaterally.  Capillary return is within normal limits  bilaterally. Temperature is within normal limits  bilaterally.  Neurologic  Senn-Weinstein monofilament wire test within normal limits  bilaterally. Muscle power within normal limits bilaterally.  Nails Unattached nail plate both hallux nails.   bilaterally. No evidence of bacterial infection or drainage bilaterally.  Orthopedic  No limitations of motion  feet .  No crepitus or effusions noted.  No bony pathology or digital deformities noted. Hammer toe second left foot.  Skin  normotropic skin with no porokeratosis noted bilaterally.  No signs of infections or ulcers noted.    Nail Dystrophy hallux  B/l.    Consent was obtained for treatment procedures.   Mechanical debridement of hallux nails  bilaterally performed with a nail nipper.  Filed with dremel without incident. Discussed treatment of her unattached nail plate  B/L.  Padding dispensed for hammer toe second left foot.   Return office visit  4 months                    Told patient to return for periodic foot care and evaluation due to potential at risk complications.   Gardiner Barefoot DPM

## 2021-06-11 ENCOUNTER — Other Ambulatory Visit: Payer: Self-pay | Admitting: Cardiovascular Disease

## 2021-07-05 ENCOUNTER — Other Ambulatory Visit: Payer: Self-pay | Admitting: Cardiovascular Disease

## 2021-07-17 ENCOUNTER — Other Ambulatory Visit: Payer: Self-pay | Admitting: Cardiovascular Disease

## 2021-08-02 DIAGNOSIS — I219 Acute myocardial infarction, unspecified: Secondary | ICD-10-CM | POA: Insufficient documentation

## 2021-09-05 ENCOUNTER — Ambulatory Visit: Payer: Medicare Other | Admitting: Physician Assistant

## 2021-09-06 ENCOUNTER — Other Ambulatory Visit: Payer: Self-pay

## 2021-09-06 ENCOUNTER — Emergency Department (HOSPITAL_BASED_OUTPATIENT_CLINIC_OR_DEPARTMENT_OTHER): Payer: Medicare Other

## 2021-09-06 ENCOUNTER — Encounter (HOSPITAL_BASED_OUTPATIENT_CLINIC_OR_DEPARTMENT_OTHER): Payer: Self-pay | Admitting: Obstetrics and Gynecology

## 2021-09-06 ENCOUNTER — Emergency Department (HOSPITAL_BASED_OUTPATIENT_CLINIC_OR_DEPARTMENT_OTHER): Payer: Medicare Other | Admitting: Radiology

## 2021-09-06 ENCOUNTER — Emergency Department (HOSPITAL_BASED_OUTPATIENT_CLINIC_OR_DEPARTMENT_OTHER)
Admission: EM | Admit: 2021-09-06 | Discharge: 2021-09-06 | Disposition: A | Discharge status: Home / Self Care | Payer: Medicare Other | Attending: Emergency Medicine | Admitting: Emergency Medicine

## 2021-09-06 DIAGNOSIS — I2510 Atherosclerotic heart disease of native coronary artery without angina pectoris: Secondary | ICD-10-CM | POA: Diagnosis not present

## 2021-09-06 DIAGNOSIS — I5043 Acute on chronic combined systolic (congestive) and diastolic (congestive) heart failure: Secondary | ICD-10-CM | POA: Insufficient documentation

## 2021-09-06 DIAGNOSIS — I130 Hypertensive heart and chronic kidney disease with heart failure and stage 1 through stage 4 chronic kidney disease, or unspecified chronic kidney disease: Secondary | ICD-10-CM | POA: Insufficient documentation

## 2021-09-06 DIAGNOSIS — J449 Chronic obstructive pulmonary disease, unspecified: Secondary | ICD-10-CM | POA: Insufficient documentation

## 2021-09-06 DIAGNOSIS — Z7982 Long term (current) use of aspirin: Secondary | ICD-10-CM | POA: Insufficient documentation

## 2021-09-06 DIAGNOSIS — R1030 Lower abdominal pain, unspecified: Secondary | ICD-10-CM | POA: Diagnosis not present

## 2021-09-06 DIAGNOSIS — R110 Nausea: Secondary | ICD-10-CM

## 2021-09-06 DIAGNOSIS — K219 Gastro-esophageal reflux disease without esophagitis: Secondary | ICD-10-CM | POA: Diagnosis not present

## 2021-09-06 DIAGNOSIS — E860 Dehydration: Secondary | ICD-10-CM | POA: Diagnosis not present

## 2021-09-06 DIAGNOSIS — Z955 Presence of coronary angioplasty implant and graft: Secondary | ICD-10-CM | POA: Diagnosis not present

## 2021-09-06 DIAGNOSIS — E039 Hypothyroidism, unspecified: Secondary | ICD-10-CM | POA: Insufficient documentation

## 2021-09-06 DIAGNOSIS — U071 COVID-19: Secondary | ICD-10-CM | POA: Insufficient documentation

## 2021-09-06 DIAGNOSIS — J1282 Pneumonia due to coronavirus disease 2019: Secondary | ICD-10-CM | POA: Insufficient documentation

## 2021-09-06 DIAGNOSIS — Z79899 Other long term (current) drug therapy: Secondary | ICD-10-CM | POA: Insufficient documentation

## 2021-09-06 DIAGNOSIS — N183 Chronic kidney disease, stage 3 unspecified: Secondary | ICD-10-CM | POA: Insufficient documentation

## 2021-09-06 DIAGNOSIS — R42 Dizziness and giddiness: Secondary | ICD-10-CM | POA: Diagnosis present

## 2021-09-06 DIAGNOSIS — J189 Pneumonia, unspecified organism: Secondary | ICD-10-CM

## 2021-09-06 DIAGNOSIS — Z853 Personal history of malignant neoplasm of breast: Secondary | ICD-10-CM | POA: Insufficient documentation

## 2021-09-06 DIAGNOSIS — Z87891 Personal history of nicotine dependence: Secondary | ICD-10-CM | POA: Insufficient documentation

## 2021-09-06 LAB — COMPREHENSIVE METABOLIC PANEL
ALT: 40 U/L (ref 0–44)
AST: 41 U/L (ref 15–41)
Albumin: 4.2 g/dL (ref 3.5–5.0)
Alkaline Phosphatase: 53 U/L (ref 38–126)
Anion gap: 11 (ref 5–15)
BUN: 39 mg/dL — ABNORMAL HIGH (ref 8–23)
CO2: 19 mmol/L — ABNORMAL LOW (ref 22–32)
Calcium: 8.8 mg/dL — ABNORMAL LOW (ref 8.9–10.3)
Chloride: 104 mmol/L (ref 98–111)
Creatinine, Ser: 1.81 mg/dL — ABNORMAL HIGH (ref 0.44–1.00)
GFR, Estimated: 28 mL/min — ABNORMAL LOW (ref >60)
Glucose, Bld: 126 mg/dL — ABNORMAL HIGH (ref 70–99)
Potassium: 4.9 mmol/L (ref 3.5–5.1)
Sodium: 134 mmol/L — ABNORMAL LOW (ref 135–145)
Total Bilirubin: 0.4 mg/dL (ref 0.3–1.2)
Total Protein: 6.7 g/dL (ref 6.5–8.1)

## 2021-09-06 LAB — CBC
HCT: 31.1 % — ABNORMAL LOW (ref 36.0–46.0)
Hemoglobin: 9.9 g/dL — ABNORMAL LOW (ref 12.0–15.0)
MCH: 28.5 pg (ref 26.0–34.0)
MCHC: 31.8 g/dL (ref 30.0–36.0)
MCV: 89.6 fL (ref 80.0–100.0)
Platelets: 172 10*3/uL (ref 150–400)
RBC: 3.47 MIL/uL — ABNORMAL LOW (ref 3.87–5.11)
RDW: 13.4 % (ref 11.5–15.5)
WBC: 7.4 10*3/uL (ref 4.0–10.5)
nRBC: 0 % (ref 0.0–0.2)

## 2021-09-06 LAB — LIPASE, BLOOD: Lipase: 138 U/L — ABNORMAL HIGH (ref 11–51)

## 2021-09-06 MED ORDER — ONDANSETRON HCL 4 MG/2ML IJ SOLN
4.0000 mg | Freq: Once | INTRAMUSCULAR | Status: AC
  Administered 2021-09-06: 4 mg via INTRAVENOUS
  Filled 2021-09-06: qty 2

## 2021-09-06 MED ORDER — DOXYCYCLINE HYCLATE 100 MG PO CAPS: 100.0000 mg | ORAL_CAPSULE | Freq: Two times a day (BID) | ORAL | 0 refills | Status: AC

## 2021-09-06 MED ORDER — ONDANSETRON 4 MG PO TBDP: 4.0000 mg | ORAL_TABLET | Freq: Three times a day (TID) | ORAL | 0 refills | Status: DC | PRN

## 2021-09-06 MED ORDER — SODIUM CHLORIDE 0.9 % IV BOLUS
500.0000 mL | Freq: Once | INTRAVENOUS | Status: AC
  Administered 2021-09-06: 500 mL via INTRAVENOUS

## 2021-09-06 MED ORDER — ONDANSETRON 4 MG PO TBDP: 4.0000 mg | ORAL_TABLET | Freq: Once | ORAL | Status: DC | PRN

## 2021-09-06 NOTE — ED Provider Notes (Signed)
[facility] EMERGENCY DEPT Provider Note   CSN: [id number] Arrival date & time: 09/06/21  1558     History Chief Complaint  Patient presents with   Nausea   Emesis    LEXIE [name] is a 80 y.o. female with a past medical history of COPD, CKD presenting to the ED with a chief complaint of nausea, emesis and lightheadedness.  She was diagnosed with COVID on 08/19/2021.  She was prescribed the antiviral pill but only took 2 days of this before she start experiencing diarrhea and stopped.  She was doing well up until yesterday.  She noticed some worsening congestion and was prescribed Mucinex DM by her PCP.  She took 1 dose of this last night.  She woke up this morning feeling like she had some discomfort in her lower abdomen.  She thought this was due to hunger so she ate some oatmeal.  States that her symptoms got worse throughout the day and then around 3 PM when her son came and visited.  Reports several episodes of nonbloody, nonbilious emesis.  She denies any abdominal pain currently.  No chest pain.  Describes the dizziness as more of like a lightheadedness and "just not feeling well."  Also reports having a headache.  Denying any vision changes, numbness in arms or legs, shortness of breath, neck pain, injury or fall. She has had normal bowel movements recently.   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   Breast cancer ([place]) 03/2010   left breast s/p lumpectomy; adjuvant radiation; on adjuvant homrnal therapy   Chronic kidney disease    stage 3   COPD (chronic obstructive pulmonary disease) (HCC)    Hay fever    History of breast cancer 09/29/2012   Hypertension    Hypothyroidism    Macular degeneration    Migraine    MVP  (mitral valve prolapse)    Osteopenia    PONV (postoperative nausea and vomiting)    Right arm fracture    Sciatic pain    Scoliosis     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   Atherosclerotic heart disease of native coronary artery without angina pectoris 12/27/2019   Other specified personal risk factors, not elsewhere classified 10/28/2019   Acute MI, anterior wall ([place]) 09/03/2019   Acute on chronic combined systolic and diastolic CHF (congestive heart failure) ([place]) 09/03/2019   Unstable angina ([place]) 09/03/2019   Bilateral pseudophakia 27/02/5008   Chronic systolic heart failure ([place]) 08/31/2019   Anemia 06/21/2019   Localized edema 06/21/2019   Underweight 06/21/2019   Cough 10/12/2018   Peripheral vascular disease ([place]) 06/19/2018   Hyperlipidemia 06/16/2018   Frequency of micturition 05/20/2018   Scoliosis 05/15/2018   Cystitis 09/03/2017   Osteoporosis 07/03/2015   Allergic rhinitis 05/26/2015  Chronic obstructive pulmonary disease (HCC) 05/26/2015   Hearing loss 05/26/2015   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[place]) 05/26/2015   Chronic kidney disease due to hypertension 05/17/2015   Encounter for general adult medical examination without abnormal findings 05/17/2015   Proteinuria 05/17/2015   Fatigue 09/07/2013   Ventricular premature depolarization 09/07/2013   History of breast cancer 09/29/2012   Intermediate stage nonexudative age-related macular degeneration of both eyes 02/20/2012   Right epiretinal membrane 02/20/2012   Macular degeneration 03/18/2011    Varicose veins of lower extremity 03/18/2011   Low back pain 12/19/2010   Malignant neoplasm of female breast ([place]) 03/27/2010   Headache 03/12/2010   Urinary incontinence 03/12/2010   Insomnia 09/07/2009   Anxiety disorder 07/03/2009   Bipolar disorder ([place]) 07/03/2009   Gastro-esophageal reflux disease without esophagitis 07/03/2009   Hypothyroidism 07/03/2009    Past Surgical History:  Procedure Laterality Date   APPENDECTOMY     BREAST BIOPSY     BREAST LUMPECTOMY     CATARACT EXTRACTION Right    COLONOSCOPY     DILATATION & CURETTAGE/HYSTEROSCOPY WITH MYOSURE N/A 05/24/2016   Procedure: DILATATION & CURETTAGE/HYSTEROSCOPY WITH MYOSURE;  Surgeon: Azucena Fallen, MD;  Location: [facility];  Service: Gynecology;  Laterality: N/A;  400 cc deficit   DILATION AND CURETTAGE OF UTERUS     uterine polyps   EYE SURGERY Right    retina   HERNIA REPAIR     LAPAROTOMY     LEFT HEART CATH AND CORONARY ANGIOGRAPHY N/A 09/07/2019   Procedure: LEFT HEART CATH AND CORONARY ANGIOGRAPHY;  Surgeon: [name], MD;  Location: [facility];  Service: Cardiovascular;  Laterality: N/A;   right arm surgery     due to fracture   TONSILLECTOMY     TUBAL LIGATION     UNILATERAL SALPINGECTOMY Left    UPPER GI ENDOSCOPY     WISDOM TOOTH EXTRACTION       OB History   No obstetric history on file.     No family history on file.  Social History   Tobacco Use   Smoking status: Former    Packs/day: 0.50    Years: 25.00    Pack years: 12.50    Types: Cigarettes    Quit date: 08/07/1985    Years since quitting: 36.1   Smokeless tobacco: Never  Vaping Use   Vaping Use: Never used  Substance Use Topics   Alcohol use: No    Alcohol/week: 1.0 standard drink    Types: 1 Glasses of wine per week   Drug use: No    Home Medications Prior to Admission medications   Medication Sig Start Date End Date Taking? Authorizing Provider  doxycycline (VIBRAMYCIN) 100 MG capsule Take 1 capsule (100  mg total) by mouth 2 (two) times daily for 7 days. 09/06/21 09/13/21 Yes Ladan Vanderzanden, PA-C  ondansetron (ZOFRAN ODT) 4 MG disintegrating tablet Take 1 tablet (4 mg total) by mouth every 8 (eight) hours as needed for nausea or vomiting. 09/06/21  Yes Lenia Housley, PA-C  aspirin EC 81 MG tablet Take 81 mg by mouth daily.    [provider]  atorvastatin (LIPITOR) 40 MG tablet Take 1 tablet (40 mg total) by mouth daily. 02/07/21   Gabriel Earing, PA-C  Biotin 1 MG CAPS  04/22/21   [provider]  buPROPion (WELLBUTRIN XL) 150 MG 24 hr tablet Take 150 mg by mouth every morning.  06/16/18   [provider]  carvedilol (COREG) 12.5  MG tablet TAKE ONE TABLET TWICE DAILY WITH A MEAL 07/17/21   Nahser, Wonda Cheng, MD  cetirizine (ZYRTEC) 10 MG tablet Take 10 mg by mouth daily.    [provider]  Cholecalciferol (VITAMIN D-3) 25 MCG (1000 UT) CAPS Take 1,000 Units by mouth daily.    [provider]  fluocinolone (VANOS) 0.01 % cream Apply topically 2 (two) times daily. 03/28/21   [provider]  furosemide (LASIX) 20 MG tablet Take 20 mg by mouth 2 (two) times a week.    [provider]  hydrALAZINE (APRESOLINE) 25 MG tablet TAKE ONE AND ONE-HALF TABLET TWICE DAILY 06/11/21   Nahser, Wonda Cheng, MD  isosorbide mononitrate (IMDUR) 30 MG 24 hr tablet TAKE ONE TABLET EACH DAY 07/05/21   Nahser, Wonda Cheng, MD  Multiple Vitamins-Minerals (PRESERVISION AREDS PO) Take by mouth.    [provider]  SYNTHROID 50 MCG tablet Take 50 mcg by mouth daily before breakfast. Take one tablet (50 mcg)  by mouth Saturday and Sunday before breakfast. Take 1.5 tablets (75 mcg) M-F. 09/07/19   [provider]  traZODone (DESYREL) 50 MG tablet Take by mouth.    [provider]  losartan (COZAAR) 25 MG tablet Take 25 mg by mouth daily. 04/17/16 06/13/20  [provider]  olmesartan (BENICAR) 20 MG tablet  04/27/18 09/03/19  [provider]     Allergies    Morphine and related, Yellow dyes (non-tartrazine), Amlodipine besylate, Amoxicillin, Eggs or egg-derived products, Penicillins, Pollen extract, and Lamictal [lamotrigine]  Review of Systems   Review of Systems  Constitutional:  Negative for appetite change, chills and fever.  HENT:  Negative for ear pain, rhinorrhea, sneezing and sore throat.   Eyes:  Negative for photophobia and visual disturbance.  Respiratory:  Negative for cough, chest tightness, shortness of breath and wheezing.   Cardiovascular:  Negative for chest pain and palpitations.  Gastrointestinal:  Positive for nausea and vomiting. Negative for abdominal pain, blood in stool, constipation and diarrhea.  Genitourinary:  Negative for dysuria, hematuria and urgency.  Musculoskeletal:  Negative for myalgias.  Skin:  Negative for rash.  Neurological:  Positive for light-headedness and headaches. Negative for dizziness and weakness.   Physical Exam Updated Vital Signs BP 140/60   Pulse 63   Temp 97.9 F (36.6 C)   Resp 17   SpO2 93%   Physical Exam Vitals and nursing note reviewed.  Constitutional:      General: She is not in acute distress.    Appearance: She is well-developed.  HENT:     Head: Normocephalic and atraumatic.     Nose: Nose normal.  Eyes:     General: No scleral icterus.       Right eye: No discharge.        Left eye: No discharge.     Conjunctiva/sclera: Conjunctivae normal.     Pupils: Pupils are equal, round, and reactive to light.  Cardiovascular:     Rate and Rhythm: Normal rate and regular rhythm.     Heart sounds: Normal heart sounds. No murmur heard.   No friction rub. No gallop.  Pulmonary:     Effort: Pulmonary effort is normal. No respiratory distress.     Breath sounds: Normal breath sounds.  Abdominal:     General: Bowel sounds are normal. There is no distension.     Palpations: Abdomen is soft.     Tenderness: There is no abdominal tenderness. There is no  guarding.  Musculoskeletal:        General: Normal range of motion.     Cervical back: Normal range of motion and neck supple.  Skin:    General: Skin is warm and dry.     Findings: No rash.  Neurological:     Mental Status: She is alert and oriented to person, place, and time.     Cranial Nerves: No cranial nerve deficit.     Sensory: No sensory deficit.     Motor: No weakness or abnormal muscle tone.     Coordination: Coordination normal.    ED Results / Procedures / Treatments   Labs (all labs ordered are listed, but only abnormal results are displayed) Labs Reviewed  LIPASE, BLOOD - Abnormal; Notable for the following components:      Result Value   Lipase 138 (*)    All other components within normal limits  COMPREHENSIVE METABOLIC PANEL - Abnormal; Notable for the following components:   Sodium 134 (*)    CO2 19 (*)    Glucose, Bld 126 (*)    BUN 39 (*)    Creatinine, Ser 1.81 (*)    Calcium 8.8 (*)    GFR, Estimated 28 (*)    All other components within normal limits  CBC - Abnormal; Notable for the following components:   RBC 3.47 (*)    Hemoglobin 9.9 (*)    HCT 31.1 (*)    All other components within normal limits    EKG EKG Interpretation  Date/Time:  Thursday September 06 2021 18:01:50 EDT Ventricular Rate:  62 PR Interval:  212 QRS Duration: 107 QT Interval:  494 QTC Calculation: 502 R Axis:   -52 Text Interpretation: Sinus rhythm Borderline prolonged PR interval Left anterior fascicular block Nonspecific repol abnormality, diffuse leads Prolonged QT interval Confirmed by Lennice Sites ([id number]) on 09/06/2021 6:16:20 PM  Radiology CT ABDOMEN PELVIS WO CONTRAST  Result Date: 09/06/2021 CLINICAL DATA:  Nausea/vomiting, dizziness, recent COVID EXAM: CT ABDOMEN AND PELVIS WITHOUT CONTRAST TECHNIQUE: Multidetector CT imaging of the abdomen and pelvis was performed following the standard protocol without IV contrast. COMPARISON:  None. FINDINGS: Lower chest:  Mild right basilar opacity, atelectasis versus pneumonia, with trace right pleural effusion. Hepatobiliary: Unenhanced liver is unremarkable. Gallbladder is unremarkable. No intrahepatic or extrahepatic ductal dilatation. Pancreas: Within normal limits. No peripancreatic inflammatory changes. Spleen: Within normal limits. Adrenals/Urinary Tract: Adrenal glands are within normal limits. Bilateral renal cysts, including an 8 mm left lower pole hemorrhagic cyst and 3.5 cm right lower pole simple cyst. No renal calculi or hydronephrosis. Bladder is within normal limits. Stomach/Bowel: Stomach is within normal limits. No evidence of bowel obstruction. Appendix is not discretely visualized, reportedly surgically absent. No colonic wall thickening or inflammatory changes. Vascular/Lymphatic: No evidence of abdominal aortic aneurysm. Atherosclerotic calcifications of the abdominal aorta and branch vessels. No suspicious abdominopelvic lymphadenopathy. Reproductive: Uterus is within normal limits. Bilateral ovaries are within normal limits. Other: No abdominopelvic ascites. Musculoskeletal: Lumbar dextroscoliosis with degenerative changes. IMPRESSION: No CT findings to account for the patient's abdominal pain. Bilateral renal cysts, as above. Mild patchy right lower lobe opacity, atelectasis versus pneumonia. Trace right pleural effusion. Electronically Signed   By: Julian Hy M.D.   On: 09/06/2021 20:59   DG Chest 2 View  Result Date: 09/06/2021 CLINICAL DATA:  Congestion EXAM: CHEST - 2 VIEW COMPARISON:  09/03/2019, 09/02/2019 FINDINGS: Mild cardiomegaly. No focal opacity or pleural effusion. No pneumothorax. Nodular opacity at the anterior cardiophrenic sulcus. IMPRESSION: 1.  No focal airspace disease. 2. Cardiomegaly with slightly enlarged central pulmonary vessels but no edema or pleural effusion. 3. Nodular opacity at the anterior lung base, could be due to nipple shadow but nodule difficult to exclude. CT  chest may be obtained for further evaluation. Electronically Signed   By: Donavan Foil M.D.   On: 09/06/2021 19:00   CT HEAD WO CONTRAST (5MM)  Result Date: 09/06/2021 CLINICAL DATA:  Vertigo, peripheral. EXAM: CT HEAD WITHOUT CONTRAST TECHNIQUE: Contiguous axial images were obtained from the base of the skull through the vertex without intravenous contrast. COMPARISON:  None. FINDINGS: Brain: Generalized age related atrophy. No focal abnormality is seen affecting the brainstem or cerebellum. Cerebral hemispheres show chronic small-vessel ischemic changes of the white matter. No cortical or large vessel territory infarction. No mass lesion, hemorrhage, hydrocephalus or extra-axial collection. Vascular: There is atherosclerotic calcification of the major vessels at the base of the brain. Skull: Negative Sinuses/Orbits: Clear/normal Other: None IMPRESSION: No acute finding. Age related atrophy. Chronic small-vessel ischemic changes of the cerebral hemispheric white matter. Electronically Signed   By: Nelson Chimes M.D.   On: 09/06/2021 19:20    Procedures Procedures   Medications Ordered in ED Medications  ondansetron (ZOFRAN) injection 4 mg (4 mg Intravenous Given 09/06/21 1812)  sodium chloride 0.9 % bolus 500 mL (500 mLs Intravenous New Bag/Given 09/06/21 1904)    ED Course  I have reviewed the triage vital signs and the nursing notes.  Pertinent labs & imaging results that were available during my care of the patient were reviewed by me and considered in my medical decision making (see chart for details).  Clinical Course as of 09/06/21 2128  Thu Sep 06, 2021  1821 Creatinine(!): 1.81 Around baseline. [HK]  1821 Lipase(!): 138 [HK]    Clinical Course User Index [HK] Delia Heady, PA-C   MDM Rules/Calculators/A&P                           80 year old female presenting to the ED for dizziness, lightheadedness, nausea and vomiting.  Was prescribed Mucinex DM by her PCP yesterday for  ongoing congestion since her COVID infection 2 weeks ago.  She took this last night and today.  She is unsure if this is related to her medication.  She describes lightheadedness, nausea.  Had some lower abdominal pain this morning but that has improved.  Has had normal bowel movements recently.  Denies any chest pain, shortness of breath.  On exam patient without neurological deficits.  She is dry heaving.  Her abdomen is soft.  Her lungs are clear to auscultation bilaterally.  Will obtain lab work, imaging and attempt to treat symptomatically.  Lab work including CBC, CMP unremarkable.  Her creatinine is 1.8 and this is around her baseline which is consistent with her CKD.  She has an elevated lipase at 138.  Her EKG shows sinus rhythm, no ischemic changes, no STEMI.  CT of the head is negative for acute abnormality.  Chest x-ray without signs of effusion or pneumonia.  CT of the abdomen pelvis done without contrast due to her CKD shows no acute intra-abdominal pelvic findings but there is concern for right lower lobe infiltrate.  Suspect this is the cause of her ongoing cough and congestion.  Her nausea is controlled here with Zofran she was given IV fluids.  She is able to tolerate p.o. intake without difficulty.  I suspect that the elevation in lipase is  secondary to dehydration.  No evidence of pancreatitis on imaging and no complicating features noted.  Will discharge with Zofran, doxycycline for superimposed pneumonia as she is greater than 2 weeks out from her COVID diagnosis.  Patient is agreeable to this plan.  We will have her follow-up with her primary care provider.  Return precautions given   Patient is hemodynamically stable, in NAD, and able to ambulate in the ED. Evaluation does not show pathology that would require ongoing emergent intervention or inpatient treatment. I explained the diagnosis to the patient. Pain has been managed and has no complaints prior to discharge. Patient is  comfortable with above plan and is stable for discharge at this time. All questions were answered prior to disposition. Strict return precautions for returning to the ED were discussed. Encouraged follow up with PCP.   An After Visit Summary was printed and given to the patient.   Portions of this note were generated with [employer]. Dictation errors may occur despite best attempts at proofreading.  Final Clinical Impression(s) / ED Diagnoses Final diagnoses:  Dehydration  Community acquired pneumonia of right lower lobe of lung  Nausea    Rx / DC Orders ED Discharge Orders          Ordered    doxycycline (VIBRAMYCIN) 100 MG capsule  2 times daily        09/06/21 2125    ondansetron (ZOFRAN ODT) 4 MG disintegrating tablet  Every 8 hours PRN        09/06/21 2125             Delia Heady, PA-C 09/06/21 2128    Lennice Sites, DO 09/06/21 2132

## 2021-09-06 NOTE — Discharge Instructions (Signed)
Make sure you are drinking plenty of fluids. Take the nausea medicine as needed. Take the antibiotics as prescribed to help with the pneumonia that was seen on your CT scan. Follow-up with your primary care provider. Return to the ER if you start to experience worsening symptoms, persistent abdominal pain, continued vomiting, chest pain, shortness of breath

## 2021-09-06 NOTE — ED Triage Notes (Signed)
Patient reports to the ER for Nausea, emesis, and dizziness. Patient states she had COVID x2 weeks ago, took antiviral medication and got diarrhea and could not continue taking them. Patient reports she took the medication x2 days. Patient states the dizziness and nausea and emesis has been occurring for a few days now.

## 2021-09-06 NOTE — ED Notes (Signed)
Patient transported to X-ray 

## 2021-09-06 NOTE — ED Notes (Signed)
Back from CT. Pt states that the Zofran is effective

## 2021-09-06 NOTE — ED Notes (Signed)
Patient transported to CT 

## 2021-09-10 ENCOUNTER — Other Ambulatory Visit: Payer: Self-pay | Admitting: Cardiovascular Disease

## 2021-09-20 ENCOUNTER — Other Ambulatory Visit: Payer: Self-pay

## 2021-09-20 DIAGNOSIS — I739 Peripheral vascular disease, unspecified: Secondary | ICD-10-CM

## 2021-10-01 ENCOUNTER — Encounter (HOSPITAL_COMMUNITY): Payer: Medicare Other

## 2021-10-01 ENCOUNTER — Encounter (HOSPITAL_COMMUNITY): Payer: Self-pay

## 2021-10-01 ENCOUNTER — Ambulatory Visit: Payer: Medicare Other

## 2021-10-05 ENCOUNTER — Encounter (HOSPITAL_COMMUNITY): Payer: Self-pay | Admitting: Radiology

## 2021-10-10 NOTE — Progress Notes (Signed)
HISTORY AND PHYSICAL     CC:  follow up. Requesting Provider:  Shon Baton, MD  HPI: This is a 80 y.o. female who is here today for follow up for PAD.  She has hx of claudication symptoms in the right leg.   She was last seen a year ago and did not have any non healing wounds or rest pain.  She was doing a walking program and her claudication sx had improved.  A carotid bruit was heard and she had a carotid duplex & this revealed 1-39% bilateral ICA stenosis.   The pt returns today for follow up.  She states that she is doing okay.  She states that she can tell a difference with her right leg compared to the left as her muscles feel weaker.  She states that she has not been as active b/c she got a stress fracture in the left leg and had to stay off of it.  She states she then got covid and then her husband got covid and had some issues.  She then got PNA.  She is hoping to slowly improve her mobilization.  She states that she and her husband are hoping to move to the new friends home next year.  She states they have a gym as well as a swimming pool that they should be access here in the near future.  She states that she still has some swelling in the right leg and this has been present for many years.  She states that this leg was bothersome all the way back to her pregnancy.  She is wearing compression, which does help.  She states she has gone down to the mild compression and wears them daily.  She states that she does have access to a pool and this does help the swelling in her right leg.  She does have hx of heart disease and kidney disease.   She does have back issues with scoliosis.  She did have covid in September 2022, hx of heart failure with EF of 30-35%.  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   The pt is on a statin for cholesterol management.    The pt is on an aspirin.    Other AC:  none The pt is on BB  for hypertension.  The pt does not have diabetes. Tobacco hx:  former  Pt does not have family hx of AAA.  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   Breast cancer ([place]) 03/2010   left breast s/p lumpectomy; adjuvant radiation; on adjuvant homrnal therapy   Chronic kidney disease    stage 3   COPD (chronic obstructive pulmonary disease) (HCC)    Hay fever    History of breast cancer 09/29/2012   Hypertension    Hypothyroidism    Macular degeneration    Migraine    MVP (mitral valve prolapse)    Osteopenia    PONV (postoperative nausea and vomiting)    Right arm fracture    Sciatic pain    Scoliosis     Past Surgical History:  Procedure Laterality Date   APPENDECTOMY     BREAST BIOPSY     BREAST LUMPECTOMY     CATARACT EXTRACTION Right    COLONOSCOPY     DILATATION & CURETTAGE/HYSTEROSCOPY  WITH MYOSURE N/A 05/24/2016   Procedure: DILATATION & CURETTAGE/HYSTEROSCOPY WITH MYOSURE;  Surgeon: Azucena Fallen, MD;  Location: [facility];  Service: Gynecology;  Laterality: N/A;  400 cc deficit   DILATION AND CURETTAGE OF UTERUS     uterine polyps   EYE SURGERY Right    retina   HERNIA REPAIR     LAPAROTOMY     LEFT HEART CATH AND CORONARY ANGIOGRAPHY N/A 09/07/2019   Procedure: LEFT HEART CATH AND CORONARY ANGIOGRAPHY;  Surgeon: [name], MD;  Location: [facility];  Service: Cardiovascular;  Laterality: N/A;   right arm surgery     due to fracture   TONSILLECTOMY     TUBAL LIGATION     UNILATERAL SALPINGECTOMY Left    UPPER GI ENDOSCOPY     WISDOM TOOTH EXTRACTION      Allergies  Allergen Reactions   Morphine And Related Nausea And Vomiting   Yellow Dyes (Non-Tartrazine) Itching, Rash and Other (See Comments)    SEVERE RASH THAT COVERED THE BACK (lasted 3 months)   Amlodipine Besylate     Other reaction(s): Unknown   Amoxicillin Diarrhea and Other (See Comments)    Fever  spiked at close to 105 F Has patient had a PCN reaction causing immediate rash, facial/tongue/throat swelling, SOB or lightheadedness with hypotension: Yes Has patient had a PCN reaction causing severe rash involving mucus membranes or skin necrosis: Yes Has patient had a PCN reaction that required hospitalization Yes Has patient had a PCN reaction occurring within the last 10 years: No If all of the above answers are "NO", then may proceed with Cephalosporin use.     Eggs Or Egg-Derived Products Swelling and Other (See Comments)    Eyes, lips, and mouth became swollen- no shortness of breath, however   Penicillins Diarrhea and Other (See Comments)    Febrile and diarrhea Has patient had a PCN reaction causing immediate rash, facial/tongue/throat swelling, SOB or lightheadedness with hypotension: Yes Has patient had a PCN reaction causing severe rash involving mucus membranes or skin necrosis: Yes Has patient had a PCN reaction that required hospitalization Yes Has patient had a PCN reaction occurring within the last 10 years: No If all of the above answers are "NO", then may proceed with Cephalosporin use.    Pollen Extract Other (See Comments)    Itchy eyes, runny nose and congestion   Lamictal [Lamotrigine] Rash    Current Outpatient Medications  Medication Sig Dispense Refill   aspirin EC 81 MG tablet Take 81 mg by mouth daily.     atorvastatin (LIPITOR) 40 MG tablet Take 1 tablet (40 mg total) by mouth daily. 30 tablet 12   Biotin 1 MG CAPS      buPROPion (WELLBUTRIN XL) 150 MG 24 hr tablet Take 150 mg by mouth every morning.      carvedilol (COREG) 12.5 MG tablet TAKE ONE TABLET TWICE DAILY WITH A MEAL 180 tablet 2   cetirizine (ZYRTEC) 10 MG tablet Take 10 mg by mouth daily.     Cholecalciferol (VITAMIN D-3) 25 MCG (1000 UT) CAPS Take 1,000 Units by mouth daily.     fluocinolone (VANOS) 0.01 % cream Apply topically 2 (two) times daily.     furosemide (LASIX) 20 MG tablet Take  20 mg by mouth 2 (two) times a week.     hydrALAZINE (APRESOLINE) 25 MG tablet TAKE ONE AND ONE-HALF TABLET TWICE DAILY 270 tablet 0   isosorbide mononitrate (IMDUR) 30 MG 24 hr  tablet TAKE ONE TABLET EACH DAY 90 tablet 1   Multiple Vitamins-Minerals (PRESERVISION AREDS PO) Take by mouth.     ondansetron (ZOFRAN ODT) 4 MG disintegrating tablet Take 1 tablet (4 mg total) by mouth every 8 (eight) hours as needed for nausea or vomiting. 5 tablet 0   SYNTHROID 50 MCG tablet Take 50 mcg by mouth daily before breakfast. Take one tablet (50 mcg)  by mouth Saturday and Sunday before breakfast. Take 1.5 tablets (75 mcg) M-F.     traZODone (DESYREL) 50 MG tablet Take by mouth.     No current facility-administered medications for this visit.    No family history on file.  Social History   Socioeconomic History   Marital status: Married    Spouse name: Not on file   Number of children: Not on file   Years of education: Not on file   Highest education level: Not on file  Occupational History   Not on file  Tobacco Use   Smoking status: Former    Packs/day: 0.50    Years: 25.00    Pack years: 12.50    Types: Cigarettes    Quit date: 08/07/1985    Years since quitting: 36.2   Smokeless tobacco: Never  Vaping Use   Vaping Use: Never used  Substance and Sexual Activity   Alcohol use: No    Alcohol/week: 1.0 standard drink    Types: 1 Glasses of wine per week   Drug use: No   Sexual activity: Yes    Birth control/protection: Post-menopausal  Other Topics Concern   Not on file  Social History Narrative   Not on file   Social Determinants of Health   Financial Resource Strain: Not on file  Food Insecurity: Not on file  Transportation Needs: Not on file  Physical Activity: Not on file  Stress: Not on file  Social Connections: Not on file  Intimate Partner Violence: Not on file     REVIEW OF SYSTEMS:   [X]  denotes positive finding, [ ]  denotes negative finding Cardiac  Comments:   Chest pain or chest pressure:    Shortness of breath upon exertion:    Short of breath when lying flat:    Irregular heart rhythm:        Vascular    Pain in calf, thigh, or hip brought on by ambulation:    Pain in feet at night that wakes you up from your sleep:     Blood clot in your veins:    Leg swelling:         Pulmonary    Oxygen at home:    Productive cough:  x   Wheezing:         Neurologic    Sudden weakness in arms or legs:     Sudden numbness in arms or legs:     Sudden onset of difficulty speaking or slurred speech:    Temporary loss of vision in one eye:     Problems with dizziness:         Gastrointestinal    Blood in stool:     Vomited blood:         Genitourinary    Burning when urinating:     Blood in urine:        Psychiatric    Major depression:         Hematologic    Bleeding problems:    Problems with blood clotting too easily:  Skin    Rashes or ulcers:        Constitutional    Fever or chills:      PHYSICAL EXAMINATION:  Today's Vitals   10/15/21 1055  BP: 139/62  Pulse: 63  Resp: 16  Temp: 97.7 F (36.5 C)  TempSrc: Oral  SpO2: 98%  Weight: 105 lb (47.6 kg)  Height: 5\' 2"  (1.575 m)   Body mass index is 19.2 kg/m.   General:  WDWN in NAD; vital signs documented above Gait: Not observed HENT: WNL, normocephalic Pulmonary: normal non-labored breathing , without wheezing Cardiac: regular HR, without Murmur; without carotid bruits Abdomen: soft, NT, no masses; aortic pulse is not palpable Skin: without rashes Vascular Exam/Pulses:  Right Left  Radial 2+ (normal) 2+ (normal)  Femoral 2+ (normal) 2+ (normal)  Popliteal Unable to palpate Unable to palpate  DP Brisk monophasic 2+ (normal)  PT Brisk monophasic 1+ (weak)  Peroneal Brisk biphasic Brisk biphasic    Extremities: without ischemic changes, without Gangrene , without cellulitis; without open wounds;  Musculoskeletal: no muscle wasting or  atrophy  Neurologic: A&O X 3 Psychiatric:  The pt has Normal affect.   Non-Invasive Vascular Imaging:   ABI's/TBI's on 10/15/2021: Right:  0.67/0.40 (M)- Great toe pressure: 50 Left:  1.02/0.86 (T) - Great toe pressure: 108  Previous ABI's/TBI's on 09/28/2020: Right:  0.65/0.51 - Great toe pressure: 78 Left:  0.99/0.91 - Great toe pressure:  138    ASSESSMENT/PLAN:: 80 y.o. female here for follow up for PAD with hx of RLE claudication  -pt doing well and ABI are essentially unchanged.  She does not have any rest pain or claudication or non healing wounds.   -discussed with pt to continue wearing her mild compression for right leg swelling and elevate as tolerated.   -given her ABI is unchanged from previous visit but better than original visit, she will f/u as needed.  She knows to call if she has any new issues including rest pain or non healing wounds.   She does have hx of CKD with her last creatinine 1.8.  we would not do an arteriogram unless she develops non healing wounds or lifestyle limiting claudication or rest pain. -continue statin/asa   Leontine Locket, [facility] MD:   Trula Slade

## 2021-10-15 ENCOUNTER — Ambulatory Visit: Payer: Medicare Other | Admitting: Physician Assistant

## 2021-10-15 ENCOUNTER — Other Ambulatory Visit: Payer: Self-pay

## 2021-10-15 ENCOUNTER — Encounter: Payer: Self-pay | Admitting: Physician Assistant

## 2021-10-15 ENCOUNTER — Ambulatory Visit (HOSPITAL_COMMUNITY)
Admission: RE | Admit: 2021-10-15 | Discharge: 2021-10-15 | Disposition: A | Discharge status: Home / Self Care | Payer: Medicare Other | Source: Ambulatory Visit | Attending: Surgery | Admitting: Surgery

## 2021-10-15 VITALS — BP 139/62 | HR 63 | Temp 97.7°F | Resp 16 | Ht 62.0 in | Wt 105.0 lb

## 2021-10-15 DIAGNOSIS — I739 Peripheral vascular disease, unspecified: Secondary | ICD-10-CM | POA: Insufficient documentation

## 2021-10-16 ENCOUNTER — Other Ambulatory Visit (HOSPITAL_COMMUNITY): Payer: Self-pay | Admitting: *Deleted

## 2021-10-17 ENCOUNTER — Ambulatory Visit (HOSPITAL_COMMUNITY)
Admission: RE | Admit: 2021-10-17 | Discharge: 2021-10-17 | Disposition: A | Discharge status: Home / Self Care | Payer: Medicare Other | Source: Ambulatory Visit | Attending: Nephrology | Admitting: Nephrology

## 2021-10-17 ENCOUNTER — Other Ambulatory Visit: Payer: Self-pay

## 2021-10-17 DIAGNOSIS — N189 Chronic kidney disease, unspecified: Secondary | ICD-10-CM | POA: Diagnosis present

## 2021-10-17 DIAGNOSIS — D631 Anemia in chronic kidney disease: Secondary | ICD-10-CM | POA: Insufficient documentation

## 2021-10-17 MED ORDER — SODIUM CHLORIDE 0.9 % IV SOLN
510.0000 mg | Freq: Once | INTRAVENOUS | Status: AC
  Administered 2021-10-17: 510 mg via INTRAVENOUS
  Filled 2021-10-17: qty 17

## 2021-11-18 ENCOUNTER — Encounter: Payer: Self-pay | Admitting: Cardiovascular Disease

## 2021-11-18 NOTE — Progress Notes (Signed)
Cardiology Office Note:    Date:  11/19/2021   ID:  AMBYR QADRI, DOB 1941/01/27, MRN [id number]  PCP:  Shon Baton, MD  Cardiologist:  Kimball Appleby  Electrophysiologist:  None   Referring MD: Shon Baton, MD   Chief Complaint  Patient presents with   Congestive Heart Failure          Previous notes:    SPARKLES MCNEELY is a 80 y.o. female with a hx of CHF. She was seen in the [facility] ER yesterday  am and was scheduled for an appt with the DOD the following day .  Echo on Sept. 4 revealed EF of 30-35%.   Grade 1 diastolic dysfunction.  Moderate pulmonary HTN with PA pressure of 56 mmHg. Mild - mod MR   We were asked to see her by Dr. Virgina Jock for further evaluation of sudden onset of rapid HR .   [place]  Woke up , could not breath  Felt weak.   Called EMS.   EMS did an ECG but HR had slowed by that time .   Came back to [name] to urgent care. .  ECG showed marked TWI ,  Felt fine,  Did not get admitted.   Echo was ordered and showed EF of 30-35%.  , moderate hypokinesis of anterior wall  PA pressure of 56 mmHg.  Was stared on Coreg 3. 125 BID   Has had some tightness in her chest for most of this summer. Really has not felt well Has been getting tired early . Has tightness in her chest with walking .   Would typically resolve when she stopped to rest.    She is had several episodes at night where she has very fast and rapid heart rate irregularities.  These are associated with intense chest pain and lightheadedness.  Clinically these could be consistent with ventricular tachycardia.  December 08, 2019:  Cayce is seen today for follow-up visit.  When I saw her several months ago it appeared that she had had a recent anterior wall myocardial infarction and was still symptomatic.  She was in congestive heart failure.  We admitted her to the hospital and arrange for her to have a heart catheterization.  At heart cath she was found to  have a diffusely diseased LAD with a moderate proximal stenosis and severe diffuse mid/distal disease.  The vessel was unfavorable for PCI.  She had mild nonobstructive disease in the right coronary artery and left circumflex artery and start on medical therapy.  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  The LV dysfunction seem to be out of proportion related to her degree of coronary artery disease.  Has improved on medical Rx.  No CP , ,   Breathing is good   Has cluadication of her right leg ( sees Dr. Ruta Hinds )   06/13/2020: Saphyre is seen for follow-up visit.  She has a history of an anterior wall myocardial infarction.  She had Takotsubo syndrome and had congestive heart failure. LVEF was originally 30 to 35%.  On medical therapy her EF has normalized and is now 55 to 60%.  Has some lingering right hip and thigh pain  Worse if she sits for a long time   Feb. 18, 2022: Aairah is seen today for follow up of  her Takotsubo syndrome   LV functin has normalized  Has continue r leg pain .  Has seen Dr. Ronnald Ramp ( neurosurgery )   Nov. 28, 2022: Desarea is seen for follow up of her CAD and Takotsubo syndrome. Cath in Sept. 2020 showed severe disease in the mid / distal LAD- not a good lesion for PCI.    LV  function has normalized.  She and her husband both had COVID in August. Has been trying to gain weight .  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   Breast cancer ([place]) 03/2010   left breast s/p lumpectomy; adjuvant radiation; on adjuvant  homrnal therapy   Chronic kidney disease    stage 3   COPD (chronic obstructive pulmonary disease) (HCC)    Hay fever    History of breast cancer 09/29/2012   Hypertension    Hypothyroidism    Macular degeneration    Migraine    MVP (mitral valve prolapse)    Osteopenia    PONV (postoperative nausea and vomiting)    Right arm fracture    Sciatic pain    Scoliosis     Past Surgical History:  Procedure Laterality Date   APPENDECTOMY     BREAST BIOPSY     BREAST LUMPECTOMY     CATARACT EXTRACTION Right    COLONOSCOPY     DILATATION & CURETTAGE/HYSTEROSCOPY WITH MYOSURE N/A 05/24/2016   Procedure: [place];  Surgeon: Azucena Fallen, MD;  Location: [facility];  Service: Gynecology;  Laterality: N/A;  400 cc deficit   DILATION AND CURETTAGE OF UTERUS     uterine polyps   EYE SURGERY Right    retina   HERNIA REPAIR     LAPAROTOMY     LEFT HEART CATH AND CORONARY ANGIOGRAPHY N/A 09/07/2019   Procedure: LEFT HEART CATH AND CORONARY ANGIOGRAPHY;  Surgeon: [name], MD;  Location: [facility];  Service: Cardiovascular;  Laterality: N/A;   right arm surgery     due to fracture   TONSILLECTOMY     TUBAL LIGATION     UNILATERAL SALPINGECTOMY Left    UPPER GI ENDOSCOPY     WISDOM TOOTH EXTRACTION      Current Medications: Current Meds  Medication Sig   aspirin EC 81 MG tablet Take 81 mg by mouth daily.   atorvastatin (LIPITOR) 40 MG tablet Take 1 tablet (40 mg total) by mouth daily.   Biotin 1 MG CAPS    buPROPion (WELLBUTRIN XL) 150 MG 24 hr tablet Take 150 mg by mouth every morning.    carvedilol (COREG) 12.5 MG tablet TAKE ONE TABLET TWICE DAILY WITH A MEAL   cetirizine (ZYRTEC) 10 MG tablet Take 10 mg by mouth daily.   Cholecalciferol (VITAMIN D-3) 25 MCG (1000 UT) CAPS Take 1,000 Units by mouth daily.   fluocinolone (VANOS) 0.01 % cream Apply topically 2 (two) times daily.   hydrALAZINE (APRESOLINE) 25 MG tablet TAKE ONE AND  ONE-HALF TABLET TWICE DAILY   isosorbide mononitrate (IMDUR) 30 MG 24 hr tablet TAKE ONE TABLET EACH DAY  Multiple Vitamins-Minerals (PRESERVISION AREDS PO) Take by mouth.   SYNTHROID 50 MCG tablet Take 50 mcg by mouth daily before breakfast. Take one tablet (50 mcg)  by mouth Saturday and Sunday before breakfast. Take 1.5 tablets (75 mcg) M-F.   traZODone (DESYREL) 50 MG tablet Take 25 mg by mouth.     Allergies:   Morphine and related, Yellow dyes (non-tartrazine), Amlodipine besylate, Amoxicillin, Eggs or egg-derived products, Penicillins, Pollen extract, and Lamictal [lamotrigine]   Social History   Socioeconomic History   Marital status: Married    Spouse name: Not on file   Number of children: Not on file   Years of education: Not on file   Highest education level: Not on file  Occupational History   Not on file  Tobacco Use   Smoking status: Former    Packs/day: 0.50    Years: 25.00    Pack years: 12.50    Types: Cigarettes    Quit date: 08/07/1985    Years since quitting: 36.3   Smokeless tobacco: Never  Vaping Use   Vaping Use: Never used  Substance and Sexual Activity   Alcohol use: No    Alcohol/week: 1.0 standard drink    Types: 1 Glasses of wine per week   Drug use: No   Sexual activity: Yes    Birth control/protection: Post-menopausal  Other Topics Concern   Not on file  Social History Narrative   Not on file   Social Determinants of Health   Financial Resource Strain: Not on file  Food Insecurity: Not on file  Transportation Needs: Not on file  Physical Activity: Not on file  Stress: Not on file  Social Connections: Not on file     Family History: The patient's family history is not on file.  ROS:   Please see the history of present illness.     All other systems reviewed and are negative.  EKGs/Labs/Other Studies Reviewed:    The following studies were reviewed today:    Recent Labs: 09/06/2021: ALT 40; BUN 39; Creatinine, Ser 1.81;  Hemoglobin 9.9; Platelets 172; Potassium 4.9; Sodium 134  Recent Lipid Panel    Component Value Date/Time   CHOL 164 09/05/2019 0011   TRIG 57 09/05/2019 0011   HDL 78 09/05/2019 0011   CHOLHDL 2.1 09/05/2019 0011   VLDL 11 09/05/2019 0011   LDLCALC 75 09/05/2019 0011    Physical Exam:    Physical Exam: Blood pressure (!) 148/72, pulse 72, height 5\' 2"  (1.575 m), weight 103 lb 9.6 oz (47 kg), SpO2 98 %.  GEN:  Well nourished, well developed in no acute distress HEENT: Normal NECK: No JVD; No carotid bruits LYMPHATICS: No lymphadenopathy CARDIAC: RRR 2-3 /6 systolic murmur radiating to the left Axillary   line  RESPIRATORY:  Clear to auscultation without rales, wheezing or rhonchi  ABDOMEN: Soft, non-tender, non-distended MUSCULOSKELETAL:  No edema; No deformity  SKIN: Warm and dry NEUROLOGIC:  Alert and oriented x 3    EKG:    ASSESSMENT:    1. Coronary artery disease involving native coronary artery of native heart without angina pectoris   2. Hyperlipidemia, unspecified hyperlipidemia type   3. Chronic kidney disease due to hypertension   4. Mitral valve insufficiency, unspecified etiology   5. Dyspnea, unspecified type     PLAN:       1.  Coronary artery disease:     has a severe stenosis of the mid - distal LAD ,  mild disease elsewhere  No angina    2.  Takotsubo syndrome:  will repeat echo    3. HTN:   BP is mildly elevated on occasion . Has lots of anxiety .  Is not sleeping well  Will defer to her nephrologist at this point for further management of her HTN She has had several meds stopped due to worsening renal function        4.  Chronic kidney disease:  Creatinine is 1.8.,  has been as high as 2 recently     Medication Adjustments/Labs and Tests Ordered: Current medicines are reviewed at length with the patient today.  Concerns regarding medicines are outlined above.  Orders Placed This Encounter  Procedures   Basic Metabolic Panel  (BMET)   Lipid Profile   ALT   ECHOCARDIOGRAM COMPLETE    No orders of the defined types were placed in this encounter.    Patient Instructions  Medication Instructions:  Your physician recommends that you continue on your current medications as directed. Please refer to the Current Medication list given to you today.  *If you need a refill on your cardiac medications before your next appointment, please call your pharmacy*   Lab Work: Lab work to be done today--BMP, Lipid, ALT If you have labs (blood work) drawn today and your tests are completely normal, you will receive your results only by: [place] (if you have MyChart) OR A paper copy in the mail If you have any lab test that is abnormal or we need to change your treatment, we will call you to review the results.   Testing/Procedures: Your physician has requested that you have an echocardiogram. Echocardiography is a painless test that uses sound waves to create images of your heart. It provides your doctor with information about the size and shape of your heart and how well your heart's chambers and valves are working. This procedure takes approximately one hour. There are no restrictions for this procedure.    Follow-Up: At [facility], you and your health needs are our priority.  As part of our continuing mission to provide you with exceptional heart care, we have created designated Provider Care Teams.  These Care Teams include your primary Cardiologist (physician) and Advanced Practice Providers (APPs -  Physician Assistants and Nurse Practitioners) who all work together to provide you with the care you need, when you need it.  We recommend signing up for the patient portal called "MyChart".  Sign up information is provided on this After Visit Summary.  MyChart is used to connect with patients for Virtual Visits (Telemedicine).  Patients are able to view lab/test results, encounter notes, upcoming appointments,  etc.  Non-urgent messages can be sent to your provider as well.   To learn more about what you can do with MyChart, go to [email or web address].    Your next appointment:   6 month(s)  The format for your next appointment:   In Person  Provider:  Follow up in 6 months with APP    Other Instructions     Signed, Mertie Moores, MD  11/19/2021 5:03 PM    [place]

## 2021-11-19 ENCOUNTER — Encounter: Payer: Self-pay | Admitting: Cardiovascular Disease

## 2021-11-19 ENCOUNTER — Ambulatory Visit: Payer: Medicare Other | Admitting: Cardiovascular Disease

## 2021-11-19 ENCOUNTER — Other Ambulatory Visit: Payer: Self-pay

## 2021-11-19 VITALS — BP 148/72 | HR 72 | Ht 62.0 in | Wt 103.6 lb

## 2021-11-19 DIAGNOSIS — E785 Hyperlipidemia, unspecified: Secondary | ICD-10-CM | POA: Diagnosis not present

## 2021-11-19 DIAGNOSIS — I340 Nonrheumatic mitral (valve) insufficiency: Secondary | ICD-10-CM | POA: Diagnosis not present

## 2021-11-19 DIAGNOSIS — I2510 Atherosclerotic heart disease of native coronary artery without angina pectoris: Secondary | ICD-10-CM

## 2021-11-19 DIAGNOSIS — R0600 Dyspnea, unspecified: Secondary | ICD-10-CM

## 2021-11-19 DIAGNOSIS — I129 Hypertensive chronic kidney disease with stage 1 through stage 4 chronic kidney disease, or unspecified chronic kidney disease: Secondary | ICD-10-CM

## 2021-11-19 NOTE — Patient Instructions (Addendum)
Medication Instructions:  Your physician recommends that you continue on your current medications as directed. Please refer to the Current Medication list given to you today.  *If you need a refill on your cardiac medications before your next appointment, please call your pharmacy*   Lab Work: Lab work to be done today--BMP, Lipid, ALT If you have labs (blood work) drawn today and your tests are completely normal, you will receive your results only by: [place] (if you have MyChart) OR A paper copy in the mail If you have any lab test that is abnormal or we need to change your treatment, we will call you to review the results.   Testing/Procedures: Your physician has requested that you have an echocardiogram. Echocardiography is a painless test that uses sound waves to create images of your heart. It provides your doctor with information about the size and shape of your heart and how well your heart's chambers and valves are working. This procedure takes approximately one hour. There are no restrictions for this procedure.    Follow-Up: At [facility], you and your health needs are our priority.  As part of our continuing mission to provide you with exceptional heart care, we have created designated Provider Care Teams.  These Care Teams include your primary Cardiologist (physician) and Advanced Practice Providers (APPs -  Physician Assistants and Nurse Practitioners) who all work together to provide you with the care you need, when you need it.  We recommend signing up for the patient portal called "MyChart".  Sign up information is provided on this After Visit Summary.  MyChart is used to connect with patients for Virtual Visits (Telemedicine).  Patients are able to view lab/test results, encounter notes, upcoming appointments, etc.  Non-urgent messages can be sent to your provider as well.   To learn more about what you can do with MyChart, go to [email or web address].    Your  next appointment:   6 month(s)  The format for your next appointment:   In Person  Provider:  Follow up in 6 months with APP    Other Instructions

## 2021-11-20 LAB — BASIC METABOLIC PANEL
BUN/Creatinine Ratio: 23 (ref 12–28)
BUN: 38 mg/dL — ABNORMAL HIGH (ref 8–27)
CO2: 18 mmol/L — ABNORMAL LOW (ref 20–29)
Calcium: 9.3 mg/dL (ref 8.7–10.3)
Chloride: 106 mmol/L (ref 96–106)
Creatinine, Ser: 1.63 mg/dL — ABNORMAL HIGH (ref 0.57–1.00)
Glucose: 99 mg/dL (ref 70–99)
Potassium: 4.6 mmol/L (ref 3.5–5.2)
Sodium: 141 mmol/L (ref 134–144)
eGFR: 31 mL/min/{1.73_m2} — ABNORMAL LOW (ref >59)

## 2021-11-20 LAB — LIPID PANEL
Chol/HDL Ratio: 2.6 ratio (ref 0.0–4.4)
Cholesterol, Total: 148 mg/dL (ref 100–199)
HDL: 57 mg/dL (ref >39)
LDL Chol Calc (NIH): 67 mg/dL (ref 0–99)
Triglycerides: 142 mg/dL (ref 0–149)
VLDL Cholesterol Cal: 24 mg/dL (ref 5–40)

## 2021-11-20 LAB — ALT: ALT: 30 IU/L (ref 0–32)

## 2021-12-07 ENCOUNTER — Ambulatory Visit (HOSPITAL_COMMUNITY): Payer: Medicare Other | Attending: Cardiovascular Disease

## 2021-12-07 ENCOUNTER — Other Ambulatory Visit: Payer: Self-pay

## 2021-12-07 DIAGNOSIS — R0600 Dyspnea, unspecified: Secondary | ICD-10-CM | POA: Diagnosis not present

## 2021-12-07 DIAGNOSIS — I340 Nonrheumatic mitral (valve) insufficiency: Secondary | ICD-10-CM | POA: Diagnosis present

## 2021-12-07 LAB — ECHOCARDIOGRAM COMPLETE
Area-P 1/2: 3.21 cm2
P 1/2 time: 440 msec
S' Lateral: 2.6 cm

## 2021-12-12 ENCOUNTER — Other Ambulatory Visit: Payer: Self-pay | Admitting: Cardiovascular Disease

## 2022-01-05 ENCOUNTER — Other Ambulatory Visit: Payer: Self-pay | Admitting: Cardiovascular Disease

## 2022-01-25 DIAGNOSIS — M79671 Pain in right foot: Secondary | ICD-10-CM | POA: Insufficient documentation

## 2022-02-07 ENCOUNTER — Telehealth: Payer: Self-pay | Admitting: *Deleted

## 2022-02-07 NOTE — Telephone Encounter (Signed)
Patient called and states she broke her toe 2 weeks ago and is in a cam walker boot. She states she is having more swelling to her right leg since the injury. Patient has not been elevating her leg or wearing her compression. Patient has follow up appt with ortho tomorrow. Advised patient to wear her compression and elevate her legs. Keep follow up appt with Ortho. Call back to schedule appt if needed. Patient verbalized understanding.

## 2022-03-01 ENCOUNTER — Other Ambulatory Visit: Payer: Self-pay | Admitting: Physician Assistant

## 2022-04-11 DIAGNOSIS — R600 Localized edema: Secondary | ICD-10-CM | POA: Insufficient documentation

## 2022-04-15 ENCOUNTER — Other Ambulatory Visit: Payer: Self-pay | Admitting: Cardiovascular Disease

## 2022-04-16 ENCOUNTER — Other Ambulatory Visit: Payer: Self-pay

## 2022-04-16 ENCOUNTER — Telehealth: Payer: Self-pay

## 2022-04-16 DIAGNOSIS — M7989 Other specified soft tissue disorders: Secondary | ICD-10-CM

## 2022-04-16 DIAGNOSIS — I739 Peripheral vascular disease, unspecified: Secondary | ICD-10-CM

## 2022-04-16 NOTE — Addendum Note (Signed)
Addended byDoylene Bode on: 04/16/2022 01:28 PM ? ? Modules accepted: Orders ? ?

## 2022-04-16 NOTE — Telephone Encounter (Signed)
Patient called in stating she is having RLE discomfort and swelling. She states the swelling has been happening since January when she broke her toe. She states the swelling does go down nightly but not completely. She does wear light knee high compression and does elevate as much as she can.Pt denies hx of DVT. She has discomfort when the leg swells excessively but not during rest. Pt has been seen in our office previously for PAD. I scheduled pt for arterial studies and f/u with PA. Patient voiced her understanding.   ?

## 2022-04-22 ENCOUNTER — Encounter (HOSPITAL_COMMUNITY): Payer: Medicare Other

## 2022-04-22 ENCOUNTER — Ambulatory Visit (INDEPENDENT_AMBULATORY_CARE_PROVIDER_SITE_OTHER)
Admission: RE | Admit: 2022-04-22 | Discharge: 2022-04-22 | Disposition: A | Discharge status: Home / Self Care | Payer: Medicare Other | Source: Ambulatory Visit | Attending: Surgery | Admitting: Surgery

## 2022-04-22 ENCOUNTER — Ambulatory Visit (HOSPITAL_COMMUNITY)
Admission: RE | Admit: 2022-04-22 | Discharge: 2022-04-22 | Disposition: A | Discharge status: Home / Self Care | Payer: Medicare Other | Source: Ambulatory Visit | Attending: Surgery | Admitting: Surgery

## 2022-04-22 DIAGNOSIS — I739 Peripheral vascular disease, unspecified: Secondary | ICD-10-CM | POA: Diagnosis not present

## 2022-04-23 ENCOUNTER — Ambulatory Visit (INDEPENDENT_AMBULATORY_CARE_PROVIDER_SITE_OTHER): Payer: Medicare Other | Admitting: Physician Assistant

## 2022-04-23 VITALS — BP 140/71 | HR 66 | Temp 98.4°F | Resp 18 | Ht 62.5 in | Wt 102.6 lb

## 2022-04-23 DIAGNOSIS — I739 Peripheral vascular disease, unspecified: Secondary | ICD-10-CM | POA: Diagnosis not present

## 2022-04-23 DIAGNOSIS — M7989 Other specified soft tissue disorders: Secondary | ICD-10-CM | POA: Diagnosis not present

## 2022-04-23 NOTE — Progress Notes (Signed)
[facility] [place] ?HISTORY AND PHYSICAL  ? ?History of Present Illness:  Patient is a 81 y.o. year old female who presents for evaluation of PAD with history of  claudication symptoms in the right leg.  She was last seen 6 months ago in our office and reported that a she has a stress fracture Jan 10th 2023 and was not as active as she normally has been.  She continues to have edema in the right LE.  The edema is from the knee down.  She has worn compression, but doesn't seem to be helping for the past few weeks. ? She is in the process of moving and has to do and coordinate all the work.  Her husband has Alzheimer and is unable to help.  She has not been able to elevate her feet due to her work load.  She states the orthopedic states her fracture has healed.   ? She denies symptoms of claudication and  non healing wound.  She states she has not had night time pain for the last 8 days.   ? ?Past Medical History:  ?Diagnosis Date  ? Anxiety   ? 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 Breast cancer ([place]) 03/2010  ? left breast s/p lumpectomy; adjuvant radiation; on adjuvant homrnal therapy  ? Chronic kidney disease   ? stage 3  ? COPD (chronic obstructive pulmonary disease) ([place])   ? Hay fever   ? History of breast cancer 09/29/2012  ? Hypertension   ? Hypothyroidism   ? Macular degeneration   ? Migraine   ? MVP (mitral valve prolapse)   ? Osteopenia   ? PONV (postoperative nausea and vomiting)   ? Right arm fracture   ? Sciatic pain   ? Scoliosis   ? ? ?Past Surgical History:  ?Procedure Laterality Date  ? APPENDECTOMY    ? BREAST BIOPSY    ? BREAST LUMPECTOMY    ? CATARACT EXTRACTION Right   ? COLONOSCOPY    ? DILATATION & CURETTAGE/HYSTEROSCOPY WITH MYOSURE N/A 05/24/2016  ? Procedure: [place];  Surgeon: Azucena Fallen, MD;  Location: [facility];  Service: Gynecology;  Laterality: N/A;  400 cc deficit  ? DILATION AND  CURETTAGE OF UTERUS    ? uterine polyps  ? EYE SURGERY Right   ? retina  ? HERNIA REPAIR    ? LAPAROTOMY    ? LEFT HEART CATH AND CORONARY ANGIOGRAPHY N/A 09/07/2019  ? Procedure: LEFT HEART CATH AND CORONARY ANGIOGRAPHY;  Surgeon: [name], MD;  Location: [facility];  Service: Cardiovascular;  Laterality: N/A;  ? right arm surgery    ? due to fracture  ? TONSILLECTOMY    ? TUBAL LIGATION    ? UNILATERAL SALPINGECTOMY Left   ? UPPER GI ENDOSCOPY    ? WISDOM TOOTH EXTRACTION    ? ? ?ROS:  ? ?General:  No weight loss, Fever, chills ? ?HEENT: No recent headaches, no nasal bleeding, no visual changes, no sore throat ? ?Neurologic: No dizziness, blackouts, seizures. No recent symptoms of stroke or mini- stroke. No recent episodes of slurred speech, or temporary blindness. ? ?Cardiac: No recent episodes of chest pain/pressure, no shortness of breath at rest.  No shortness of breath with exertion.  Denies history of atrial fibrillation or irregular heartbeat ? ?Vascular: No history of rest pain in feet.  No history of claudication.  No history of non-healing ulcer, No history of DVT  ? ?Pulmonary: No home oxygen, no productive cough, no hemoptysis,  No asthma or wheezing ? ?Musculoskeletal:  '[ ]'$  Arthritis, '[ ]'$  Low back pain,  '[ ]'$  Joint pain ? ?Hematologic:No history of hypercoagulable state.  No history of easy bleeding.  No history of anemia ? ?Gastrointestinal: No hematochezia or melena,  No gastroesophageal reflux, no trouble swallowing ? ?Urinary: '[ ]'$  chronic Kidney disease, '[ ]'$  on HD - '[ ]'$  MWF or '[ ]'$  TTHS, '[ ]'$  Burning with urination, '[ ]'$  Frequent urination, '[ ]'$  Difficulty urinating;  ? ?Skin: No rashes ? ?Psychological: No history of anxiety,  No history of depression ? ?Social History ?Social History  ? ?Tobacco Use  ? Smoking status: Former  ?  Packs/day: 0.50  ?  Years: 25.00  ?  Pack years: 12.50  ?  Types: Cigarettes  ?  Quit date: 08/07/1985  ?  Years since quitting: 36.7  ? Smokeless tobacco: Never   ?Vaping Use  ? Vaping Use: Never used  ?Substance Use Topics  ? Alcohol use: No  ?  Alcohol/week: 1.0 standard drink  ?  Types: 1 Glasses of wine per week  ? Drug use: No  ? ? ?Family History ?History reviewed. No pertinent family history. ? ?Allergies ? ?Allergies  ?Allergen Reactions  ? Morphine And Related Nausea And Vomiting  ? Yellow Dyes (Non-Tartrazine) Itching, Rash and Other (See Comments)  ?  SEVERE RASH THAT COVERED THE BACK (lasted 3 months)  ? Amlodipine Besylate   ?  Other reaction(s): Unknown  ? Amoxicillin Diarrhea and Other (See Comments)  ?  Fever spiked at close to 105 F ?Has patient had a PCN reaction causing immediate rash, facial/tongue/throat swelling, SOB or lightheadedness with hypotension: Yes ?Has patient had a PCN reaction causing severe rash involving mucus membranes or skin necrosis: Yes ?Has patient had a PCN reaction that required hospitalization Yes ?Has patient had a PCN reaction occurring within the last 10 years: No ?If all of the above answers are "NO", then may proceed with Cephalosporin use. ? ?  ? Eggs Or Egg-Derived Products Swelling and Other (See Comments)  ?  Eyes, lips, and mouth became swollen- no shortness of breath, however  ? Penicillins Diarrhea and Other (See Comments)  ?  Febrile and diarrhea ?Has patient had a PCN reaction causing immediate rash, facial/tongue/throat swelling, SOB or lightheadedness with hypotension: Yes ?Has patient had a PCN reaction causing severe rash involving mucus membranes or skin necrosis: Yes ?Has patient had a PCN reaction that required hospitalization Yes ?Has patient had a PCN reaction occurring within the last 10 years: No ?If all of the above answers are "NO", then may proceed with Cephalosporin use. ?  ? Pollen Extract Other (See Comments)  ?  Itchy eyes, runny nose and congestion  ? Lamictal [Lamotrigine] Rash  ? ? ? ?Current Outpatient Medications  ?Medication Sig Dispense Refill  ? aspirin EC 81 MG tablet Take 81 mg by mouth  daily.    ? atorvastatin (LIPITOR) 40 MG tablet TAKE ONE TABLET BY MOUTH ONCE DAILY 30 tablet 12  ? Biotin 1 MG CAPS     ? buPROPion (WELLBUTRIN XL) 150 MG 24 hr tablet Take 150 mg by mouth every morning.     ? carvedilol (COREG) 12.5 MG tablet TAKE ONE TABLET TWICE DAILY WITH A MEAL 180 tablet 2  ? cetirizine (ZYRTEC) 10 MG tablet Take 10 mg by mouth daily.    ?  Cholecalciferol (VITAMIN D-3) 25 MCG (1000 UT) CAPS Take 1,000 Units by mouth daily.    ? fluocinolone (VANOS) 0.01 % cream Apply topically 2 (two) times daily.    ? hydrALAZINE (APRESOLINE) 25 MG tablet TAKE ONE AND ONE-HALF TABLET TWICE DAILY 270 tablet 3  ? isosorbide mononitrate (IMDUR) 30 MG 24 hr tablet TAKE ONE TABLET EACH DAY 90 tablet 3  ? Multiple Vitamins-Minerals (PRESERVISION AREDS PO) Take by mouth.    ? SYNTHROID 50 MCG tablet Take 50 mcg by mouth daily before breakfast. Take one tablet (50 mcg)  by mouth Saturday and Sunday before breakfast. Take 1.5 tablets (75 mcg) M-F.    ? traZODone (DESYREL) 50 MG tablet Take 25 mg by mouth.    ? furosemide (LASIX) 20 MG tablet Take 20 mg by mouth 2 (two) times a week.    ? ondansetron (ZOFRAN ODT) 4 MG disintegrating tablet Take 1 tablet (4 mg total) by mouth every 8 (eight) hours as needed for nausea or vomiting. 5 tablet 0  ? ?No current facility-administered medications for this visit.  ? ? ?Physical Examination ? ?Vitals:  ? 04/23/22 0937  ?BP: 140/71  ?Pulse: 66  ?Resp: 18  ?Temp: 98.4 ?F (36.9 ?C)  ?TempSrc: Temporal  ?SpO2: 100%  ?Weight: 102 lb 9.6 oz (46.5 kg)  ?Height: 5' 2.5" (1.588 m)  ? ? ?Body mass index is 18.47 kg/m?. ? ?General:  Alert and oriented, no acute distress ?HEENT: Normal ?Neck: No bruit or JVD ?Pulmonary: Clear to auscultation bilaterally ?Cardiac: Regular Rate and Rhythm without murmur ?Abdomen: Soft, non-tender, non-distended, no mass, no scars ?Skin: No rash ? ? ?Extremity Pulses:  2+ radial, brachial, femoral, doppler dorsalis pedis, posterior tibial pulses  bilaterally ?Musculoskeletal: Right LE edema  ?Neurologic: Upper and lower extremity motor 5/5 and symmetric ? ?DATA:  ? ?   ?ABI Findings:  ?+---------+------------------+-----+----------+--------+  ?Right    Rt Pressure

## 2022-04-25 ENCOUNTER — Encounter (HOSPITAL_COMMUNITY): Payer: Medicare Other

## 2022-04-29 ENCOUNTER — Other Ambulatory Visit: Payer: Self-pay

## 2022-04-29 DIAGNOSIS — M7989 Other specified soft tissue disorders: Secondary | ICD-10-CM

## 2022-05-01 ENCOUNTER — Encounter (HOSPITAL_COMMUNITY): Payer: Medicare Other

## 2022-05-01 ENCOUNTER — Ambulatory Visit: Payer: Medicare Other

## 2022-05-09 ENCOUNTER — Ambulatory Visit (HOSPITAL_COMMUNITY)
Admission: RE | Admit: 2022-05-09 | Discharge: 2022-05-09 | Disposition: A | Discharge status: Home / Self Care | Payer: Medicare Other | Source: Ambulatory Visit | Attending: Vascular Surgery | Admitting: Vascular Surgery

## 2022-05-09 ENCOUNTER — Ambulatory Visit (INDEPENDENT_AMBULATORY_CARE_PROVIDER_SITE_OTHER): Payer: Medicare Other | Admitting: Physician Assistant

## 2022-05-09 VITALS — BP 104/50 | HR 62 | Temp 98.0°F | Resp 20 | Ht 62.5 in | Wt 101.7 lb

## 2022-05-09 DIAGNOSIS — M7989 Other specified soft tissue disorders: Secondary | ICD-10-CM

## 2022-05-09 DIAGNOSIS — I739 Peripheral vascular disease, unspecified: Secondary | ICD-10-CM

## 2022-05-09 NOTE — Progress Notes (Signed)
Office Note     CC:  follow up Requesting Provider:  Shon Baton, MD  HPI: Shelly [name] is a 81 y.o. ([birth year]) female who presents to review a venous duplex of the right lower extremity.  She was seen in the office couple weeks ago for evaluation of PAD with a known right SFA occlusion.  Patient states she is very active and walks every day.  She has not been limited by her claudication symptoms of her right calf recently.  Patient was more concerned about the swelling of her right leg last visit and thus returns with a venous duplex.  She manages swelling symptoms with compression and elevation.  Admittedly her compression socks are easy to get on and do not feel tight.  She denies history of DVT, venous ulcerations, trauma, or prior vascular interventions.  She denies tobacco use.   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[place])    with psychosis   Breast cancer ([place]) 03/2010   left breast s/p lumpectomy; adjuvant radiation; on adjuvant homrnal therapy   Chronic kidney disease    stage 3   COPD (chronic obstructive pulmonary disease) ([facility])    Hay fever    History of breast cancer 09/29/2012   Hypertension    Hypothyroidism    Macular degeneration    Migraine    MVP (mitral valve prolapse)    Osteopenia    PONV (postoperative nausea and vomiting)    Right arm fracture    Sciatic pain    Scoliosis     Past Surgical History:  Procedure Laterality Date   APPENDECTOMY     BREAST BIOPSY     BREAST LUMPECTOMY     CATARACT EXTRACTION Right    COLONOSCOPY     DILATATION & CURETTAGE/HYSTEROSCOPY WITH MYOSURE N/A 05/24/2016   Procedure: [place];  Surgeon: Azucena Fallen, MD;  Location: [facility];  Service: Gynecology;  Laterality: N/A;  400 cc deficit   DILATION AND CURETTAGE OF UTERUS     uterine polyps   EYE SURGERY Right    retina   HERNIA REPAIR     LAPAROTOMY     LEFT HEART CATH  AND CORONARY ANGIOGRAPHY N/A 09/07/2019   Procedure: LEFT HEART CATH AND CORONARY ANGIOGRAPHY;  Surgeon: [name], MD;  Location: [facility];  Service: Cardiovascular;  Laterality: N/A;   right arm surgery     due to fracture   TONSILLECTOMY     TUBAL LIGATION     UNILATERAL SALPINGECTOMY Left    UPPER GI ENDOSCOPY     WISDOM TOOTH EXTRACTION      Social History   Socioeconomic History   Marital status: Married    Spouse name: Not on file   Number of children: Not on file   Years of education: Not on file   Highest education level: Not on file  Occupational History   Not on file  Tobacco Use   Smoking status: Former    Packs/day: 0.50    Years: 25.00    Pack years: 12.50    Types: Cigarettes    Quit date: 08/07/1985    Years since quitting: 36.7    Passive exposure: Never   Smokeless tobacco: Never  Vaping Use   Vaping Use: Never used  Substance and Sexual Activity   Alcohol use: No    Alcohol/week: 1.0 standard drink  Types: 1 Glasses of wine per week   Drug use: No   Sexual activity: Yes    Birth control/protection: Post-menopausal  Other Topics Concern   Not on file  Social History Narrative   Not on file   Social Determinants of Health   Financial Resource Strain: Not on file  Food Insecurity: Not on file  Transportation Needs: Not on file  Physical Activity: Not on file  Stress: Not on file  Social Connections: Not on file  Intimate Partner Violence: Not on file   History reviewed. No pertinent family history.  Current Outpatient Medications  Medication Sig Dispense Refill   aspirin EC 81 MG tablet Take 81 mg by mouth daily.     atorvastatin (LIPITOR) 40 MG tablet TAKE ONE TABLET BY MOUTH ONCE DAILY 30 tablet 12   Biotin 1 MG CAPS      buPROPion (WELLBUTRIN XL) 150 MG 24 hr tablet Take 150 mg by mouth every morning.      carvedilol (COREG) 12.5 MG tablet TAKE ONE TABLET TWICE DAILY WITH A MEAL 180 tablet 2   cetirizine (ZYRTEC) 10 MG  tablet Take 10 mg by mouth daily.     Cholecalciferol (VITAMIN D-3) 25 MCG (1000 UT) CAPS Take 1,000 Units by mouth daily.     fluocinolone (VANOS) 0.01 % cream Apply topically 2 (two) times daily.     furosemide (LASIX) 20 MG tablet Take 20 mg by mouth 2 (two) times a week.     hydrALAZINE (APRESOLINE) 25 MG tablet TAKE ONE AND ONE-HALF TABLET TWICE DAILY 270 tablet 3   isosorbide mononitrate (IMDUR) 30 MG 24 hr tablet TAKE ONE TABLET EACH DAY 90 tablet 3   Multiple Vitamins-Minerals (PRESERVISION AREDS PO) Take by mouth.     SYNTHROID 50 MCG tablet Take 50 mcg by mouth daily before breakfast. Take one tablet (50 mcg)  by mouth Saturday and Sunday before breakfast. Take 1.5 tablets (75 mcg) M-F.     traZODone (DESYREL) 50 MG tablet Take 25 mg by mouth.     No current facility-administered medications for this visit.    Allergies  Allergen Reactions   Morphine And Related Nausea And Vomiting   Yellow Dyes (Non-Tartrazine) Itching, Rash and Other (See Comments)    SEVERE RASH THAT COVERED THE BACK (lasted 3 months)   Amlodipine Besylate     Other reaction(s): Unknown   Amoxicillin Diarrhea and Other (See Comments)    Fever spiked at close to 105 F Has patient had a PCN reaction causing immediate rash, facial/tongue/throat swelling, SOB or lightheadedness with hypotension: Yes Has patient had a PCN reaction causing severe rash involving mucus membranes or skin necrosis: Yes Has patient had a PCN reaction that required hospitalization Yes Has patient had a PCN reaction occurring within the last 10 years: No If all of the above answers are "NO", then may proceed with Cephalosporin use.     Eggs Or Egg-Derived Products Swelling and Other (See Comments)    Eyes, lips, and mouth became swollen- no shortness of breath, however   Penicillins Diarrhea and Other (See Comments)    Febrile and diarrhea Has patient had a PCN reaction causing immediate rash, facial/tongue/throat swelling, SOB or  lightheadedness with hypotension: Yes Has patient had a PCN reaction causing severe rash involving mucus membranes or skin necrosis: Yes Has patient had a PCN reaction that required hospitalization Yes Has patient had a PCN reaction occurring within the last 10 years: No If all of the above  answers are "NO", then may proceed with Cephalosporin use.    Pollen Extract Other (See Comments)    Itchy eyes, runny nose and congestion   Lamictal [Lamotrigine] Rash     REVIEW OF SYSTEMS:   '[X]'$  denotes positive finding, '[ ]'$  denotes negative finding Cardiac  Comments:  Chest pain or chest pressure:    Shortness of breath upon exertion:    Short of breath when lying flat:    Irregular heart rhythm:        Vascular    Pain in calf, thigh, or hip brought on by ambulation:    Pain in feet at night that wakes you up from your sleep:     Blood clot in your veins:    Leg swelling:         Pulmonary    Oxygen at home:    Productive cough:     Wheezing:         Neurologic    Sudden weakness in arms or legs:     Sudden numbness in arms or legs:     Sudden onset of difficulty speaking or slurred speech:    Temporary loss of vision in one eye:     Problems with dizziness:         Gastrointestinal    Blood in stool:     Vomited blood:         Genitourinary    Burning when urinating:     Blood in urine:        Psychiatric    Major depression:         Hematologic    Bleeding problems:    Problems with blood clotting too easily:        Skin    Rashes or ulcers:        Constitutional    Fever or chills:      PHYSICAL EXAMINATION:  Vitals:   05/09/22 1013  BP: (!) 104/50  Pulse: 62  Resp: 20  Temp: 98 F (36.7 C)  TempSrc: Temporal  SpO2: 99%  Weight: 101 lb 11.2 oz (46.1 kg)  Height: 5' 2.5" (1.588 m)    General:  WDWN in NAD; vital signs documented above Gait: Not observed HENT: WNL, normocephalic Pulmonary: normal non-labored breathing , without Rales, rhonchi,   wheezing Cardiac: regular HR Abdomen: soft, NT, no masses Skin: without rashes Vascular Exam/Pulses:  Right Left  Radial 2+ (normal) 2+ (normal)  DP absent 2+ (normal)  PT absent absent   Extremities: without ischemic changes, without Gangrene , without cellulitis; without open wounds;  Musculoskeletal: no muscle wasting or atrophy  Neurologic: A&O X 3;  No focal weakness or paresthesias are detected Psychiatric:  The pt has Normal affect.   Non-Invasive Vascular Imaging:   Right lower extremity venous reflux study negative for DVT    ASSESSMENT/PLAN:: 81 y.o. female here for follow up for venous reflux study due to right leg edema  -Venous reflux study is negative for DVT -Edema of right leg will be managed with 15 to 20 mmHg knee-high compression socks as well as periodic elevation above the level of the heart.  Patient would prefer an open toe box compression sock and will purchase these at an outside facility.  We also discussed continuing to stay active and using NSAIDs for discomfort associated with edema.  Patient will follow-up as scheduled for periodic ABIs to monitor PAD.  We can also look at a reflux study in the future if edema  becomes more symptomatic.   Dagoberto Ligas, PA-C [facility] [phone]  Clinic MD:   Scot Dock

## 2022-05-15 ENCOUNTER — Ambulatory Visit (INDEPENDENT_AMBULATORY_CARE_PROVIDER_SITE_OTHER): Payer: Medicare Other | Admitting: Physician Assistant

## 2022-05-15 ENCOUNTER — Encounter: Payer: Self-pay | Admitting: Physician Assistant

## 2022-05-15 VITALS — BP 120/64 | HR 61 | Ht 62.5 in | Wt 97.0 lb

## 2022-05-15 DIAGNOSIS — I5181 Takotsubo syndrome: Secondary | ICD-10-CM

## 2022-05-15 DIAGNOSIS — I129 Hypertensive chronic kidney disease with stage 1 through stage 4 chronic kidney disease, or unspecified chronic kidney disease: Secondary | ICD-10-CM

## 2022-05-15 DIAGNOSIS — I10 Essential (primary) hypertension: Secondary | ICD-10-CM

## 2022-05-15 DIAGNOSIS — M7989 Other specified soft tissue disorders: Secondary | ICD-10-CM | POA: Diagnosis not present

## 2022-05-15 DIAGNOSIS — I2510 Atherosclerotic heart disease of native coronary artery without angina pectoris: Secondary | ICD-10-CM

## 2022-05-15 NOTE — Progress Notes (Signed)
Cardiology Office Note:    Date:  05/15/2022   ID:  Shelly [name], DOB 1941/09/05, MRN [id number]  PCP:  Shon Baton, MD  [facility] Cardiologist:  Mertie Moores, MD  [place] Electrophysiologist:  None   Chief Complaint: 6 months follow up   History of Present Illness:    Shelly [name] is a 81 y.o. female with a hx of medically managed CAD, Takostsubo cardiomyopathy with improved LVEF, HTN, CKD III and breast cancer seen for follow up.   Echo 08/27/2019 with  EF of 30-35%. Grade 1 diastolic dysfunction. Moderate pulmonary HTN with PA pressure of 56 mmHg. Mild - mod MR. Cath 09/07/19 with significant diffuse LAD lesion, felt to be unfavorable 2/2 to tortuosity and diffuse nature. Mild nonobstructive disease in the RCA, and Lcx. 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 Recommendations for medical therapy.   Echo 05/2020: LVEF 55-60% Echo 11/2021 with LVEF of 55-60% and grade 1 DD. No WM abnormality. Mild MR.   Here today for follow up.  Patient has lower extremity edema and her Lasix is managed by nephrologist Dr. Carolin Sicks.  Also wearing compression stocking.  She denies chest pain, shortness of breath, orthopnea, PND, melena or blood in her stool or urine.  Uses low-sodium diet.  She is moving to [facility] for assisted living.  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   Breast cancer ([place]) 03/2010   left breast s/p lumpectomy; adjuvant radiation; on adjuvant homrnal therapy   Chronic kidney disease    stage 3   COPD (chronic obstructive pulmonary disease) (HCC)    Hay fever    History of breast cancer 09/29/2012   Hypertension    Hypothyroidism    Macular degeneration    Migraine    MVP (mitral valve prolapse)    Osteopenia    PONV (postoperative nausea and vomiting)    Right arm fracture    Sciatic pain    Scoliosis     Past Surgical History:  Procedure  Laterality Date   APPENDECTOMY     BREAST BIOPSY     BREAST LUMPECTOMY     CATARACT EXTRACTION Right    COLONOSCOPY     DILATATION & CURETTAGE/HYSTEROSCOPY WITH MYOSURE N/A 05/24/2016   Procedure: [place];  Surgeon: Azucena Fallen, MD;  Location: [facility];  Service: Gynecology;  Laterality: N/A;  400 cc deficit   DILATION AND CURETTAGE OF UTERUS     uterine polyps   EYE SURGERY Right    retina   HERNIA REPAIR     LAPAROTOMY     LEFT HEART CATH AND CORONARY ANGIOGRAPHY N/A 09/07/2019   Procedure: LEFT HEART CATH AND CORONARY ANGIOGRAPHY;  Surgeon: [name], MD;  Location: [facility];  Service: Cardiovascular;  Laterality: N/A;   right arm surgery     due to fracture   TONSILLECTOMY     TUBAL LIGATION     UNILATERAL SALPINGECTOMY Left    UPPER GI ENDOSCOPY     WISDOM TOOTH EXTRACTION      Current Medications: Current Meds  Medication Sig   aspirin EC 81 MG tablet Take 81 mg by mouth daily.   atorvastatin (LIPITOR) 40 MG tablet TAKE ONE TABLET BY MOUTH ONCE DAILY   Biotin 1 MG CAPS    buPROPion (WELLBUTRIN XL) 150 MG 24 hr tablet  Take 150 mg by mouth every morning.    carvedilol (COREG) 12.5 MG tablet TAKE ONE TABLET TWICE DAILY WITH A MEAL   cetirizine (ZYRTEC) 10 MG tablet Take 10 mg by mouth daily.   Cholecalciferol (VITAMIN D-3) 25 MCG (1000 UT) CAPS Take 1,000 Units by mouth daily.   fluocinolone (VANOS) 0.01 % cream Apply topically 2 (two) times daily.   furosemide (LASIX) 20 MG tablet Take 20 mg by mouth daily.   hydrALAZINE (APRESOLINE) 25 MG tablet TAKE ONE AND ONE-HALF TABLET TWICE DAILY   isosorbide mononitrate (IMDUR) 30 MG 24 hr tablet TAKE ONE TABLET EACH DAY   Multiple Vitamins-Minerals (PRESERVISION AREDS PO) Take by mouth.   SYNTHROID 50 MCG tablet Take 50 mcg by mouth daily before breakfast. Take one tablet (50 mcg)  by mouth Saturday and Sunday before breakfast. Take 1.5 tablets (75 mcg) M-F.   traZODone  (DESYREL) 50 MG tablet Take 25 mg by mouth.   TRIAMCINOLONE ACETONIDE EX Apply topically. 2 times per day     Allergies:   Morphine and related, Yellow dyes (non-tartrazine), Amlodipine besylate, Amoxicillin, Eggs or egg-derived products, Penicillins, Pollen extract, and Lamictal [lamotrigine]   Social History   Socioeconomic History   Marital status: Married    Spouse name: Not on file   Number of children: Not on file   Years of education: Not on file   Highest education level: Not on file  Occupational History   Not on file  Tobacco Use   Smoking status: Former    Packs/day: 0.50    Years: 25.00    Pack years: 12.50    Types: Cigarettes    Quit date: 08/07/1985    Years since quitting: 36.7    Passive exposure: Never   Smokeless tobacco: Never  Vaping Use   Vaping Use: Never used  Substance and Sexual Activity   Alcohol use: No    Alcohol/week: 1.0 standard drink    Types: 1 Glasses of wine per week   Drug use: No   Sexual activity: Yes    Birth control/protection: Post-menopausal  Other Topics Concern   Not on file  Social History Narrative   Not on file   Social Determinants of Health   Financial Resource Strain: Not on file  Food Insecurity: Not on file  Transportation Needs: Not on file  Physical Activity: Not on file  Stress: Not on file  Social Connections: Not on file     Family History: The patient's family history is not on file.    ROS:   Please see the history of present illness.    All other systems reviewed and are negative.   EKGs/Labs/Other Studies Reviewed:    The following studies were reviewed today:  Echo 11/2021  1. Left ventricular ejection fraction, by estimation, is 55 to 60%. Left  ventricular ejection fraction by 3D volume is 57 %. The left ventricle has  normal function. The left ventricle has no regional wall motion  abnormalities. Left ventricular diastolic   parameters are consistent with Grade I diastolic dysfunction  (impaired  relaxation). The average left ventricular global longitudinal strain is  -25.8 %. The global longitudinal strain is normal.   2. Right ventricular systolic function is normal. The right ventricular  size is normal. There is mildly elevated pulmonary artery systolic  pressure.   3. Left atrial size was severely dilated.   4. The mitral valve is normal in structure. Mild mitral valve  regurgitation. No evidence of mitral  stenosis.   5. Tricuspid valve regurgitation is moderate.   6. The aortic valve is tricuspid. Aortic valve regurgitation is mild. No  aortic stenosis is present.   7. The inferior vena cava is dilated in size with >50% respiratory  variability, suggesting right atrial pressure of 8 mmHg.   Comparison(s): 05/23/20 EF 55-60%.   EKG:  EKG is not  ordered today.   Recent Labs: 09/06/2021: Hemoglobin 9.9; Platelets 172 11/19/2021: ALT 30; BUN 38; Creatinine, Ser 1.63; Potassium 4.6; Sodium 141  Recent Lipid Panel    Component Value Date/Time   CHOL 148 11/19/2021 1645   TRIG 142 11/19/2021 1645   HDL 57 11/19/2021 1645   CHOLHDL 2.6 11/19/2021 1645   CHOLHDL 2.1 09/05/2019 0011   VLDL 11 09/05/2019 0011   LDLCALC 67 11/19/2021 1645    Physical Exam:    VS:  BP 120/64   Pulse 61   Ht 5' 2.5" (1.588 m)   Wt 97 lb (44 kg)   SpO2 97%   BMI 17.46 kg/m     Wt Readings from Last 3 Encounters:  05/15/22 97 lb (44 kg)  05/09/22 101 lb 11.2 oz (46.1 kg)  04/23/22 102 lb 9.6 oz (46.5 kg)     GEN:  Well nourished, well developed in no acute distress HEENT: Normal NECK: No JVD; No carotid bruits LYMPHATICS: No lymphadenopathy CARDIAC: RRR, no murmurs, rubs, gallops RESPIRATORY:  Clear to auscultation without rales, wheezing or rhonchi  ABDOMEN: Soft, non-tender, non-distended MUSCULOSKELETAL:  No edema; No deformity  SKIN: Warm and dry NEUROLOGIC:  Alert and oriented x 3 PSYCHIATRIC:  Normal affect   ASSESSMENT AND PLAN:    CAD - Medically  managed.  No anginal symptoms.  Continue aspirin, statin, beta-blocker and Imdur.  2. Hx of Takostsubo cardiomyopathy with improved LVEF - EF normalized to 55-60%.  No orthopnea or PND.  Continue current medical therapy.  3. HTN -Blood pressure controlled on current medication.  4.  Lower extremity edema 5.  CKD stage III -Patient has chronic lower extremity edema.  She takes Lasix which is managed by nephrologist.  Continue to wear compression stocking.  No change in therapy.  Medication Adjustments/Labs and Tests Ordered: Current medicines are reviewed at length with the patient today.  Concerns regarding medicines are outlined above.  No orders of the defined types were placed in this encounter.  No orders of the defined types were placed in this encounter.   Patient Instructions  Medication Instructions:  Your physician recommends that you continue on your current medications as directed. Please refer to the Current Medication list given to you today.  *If you need a refill on your cardiac medications before your next appointment, please call your pharmacy*   Lab Work: NONE If you have labs (blood work) drawn today and your tests are completely normal, you will receive your results only by: [place] (if you have MyChart) OR A paper copy in the mail If you have any lab test that is abnormal or we need to change your treatment, we will call you to review the results.   Testing/Procedures: NONE   Follow-Up: At [facility], you and your health needs are our priority.  As part of our continuing mission to provide you with exceptional heart care, we have created designated Provider Care Teams.  These Care Teams include your primary Cardiologist (physician) and Advanced Practice Providers (APPs -  Physician Assistants and Nurse Practitioners) who all work together to provide you with  the care you need, when you need it.  We recommend signing up for the patient portal  called "MyChart".  Sign up information is provided on this After Visit Summary.  MyChart is used to connect with patients for Virtual Visits (Telemedicine).  Patients are able to view lab/test results, encounter notes, upcoming appointments, etc.  Non-urgent messages can be sent to your provider as well.   To learn more about what you can do with MyChart, go to [email or web address].    Your next appointment:   12 month(s)  The format for your next appointment:   In Person  Provider:   Mertie Moores, MD {   Important Information About Sugar         Jarrett Soho, [place]  05/15/2022 11:13 AM    [place]

## 2022-05-15 NOTE — Patient Instructions (Signed)
Medication Instructions:  Your physician recommends that you continue on your current medications as directed. Please refer to the Current Medication list given to you today.  *If you need a refill on your cardiac medications before your next appointment, please call your pharmacy*   Lab Work: NONE If you have labs (blood work) drawn today and your tests are completely normal, you will receive your results only by: [place] (if you have MyChart) OR A paper copy in the mail If you have any lab test that is abnormal or we need to change your treatment, we will call you to review the results.   Testing/Procedures: NONE   Follow-Up: At [facility], you and your health needs are our priority.  As part of our continuing mission to provide you with exceptional heart care, we have created designated Provider Care Teams.  These Care Teams include your primary Cardiologist (physician) and Advanced Practice Providers (APPs -  Physician Assistants and Nurse Practitioners) who all work together to provide you with the care you need, when you need it.  We recommend signing up for the patient portal called "MyChart".  Sign up information is provided on this After Visit Summary.  MyChart is used to connect with patients for Virtual Visits (Telemedicine).  Patients are able to view lab/test results, encounter notes, upcoming appointments, etc.  Non-urgent messages can be sent to your provider as well.   To learn more about what you can do with MyChart, go to [email or web address].    Your next appointment:   12 month(s)  The format for your next appointment:   In Person  Provider:   Mertie Moores, MD {   Important Information About Sugar

## 2022-09-10 ENCOUNTER — Ambulatory Visit: Payer: Medicare Other | Admitting: Podiatrist

## 2022-09-10 DIAGNOSIS — M79609 Pain in unspecified limb: Secondary | ICD-10-CM | POA: Diagnosis not present

## 2022-09-10 DIAGNOSIS — B351 Tinea unguium: Secondary | ICD-10-CM | POA: Diagnosis not present

## 2022-09-11 ENCOUNTER — Encounter: Payer: Self-pay | Admitting: Podiatrist

## 2022-09-11 NOTE — Progress Notes (Signed)
Subjective: Shelly [name] is a 81 y.o. female patient who presents to office today with concern of long,mildly painful nails  while ambulating in shoes; especially her great toenails.  She is unable to trim.  She relates she was diagnosed with PVD on the right great toe.  She has CKD.  Patient denies any new cramping, numbness, burning or tingling in the legs or feet.  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[place]) 06/22/2020   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   Atherosclerotic heart disease of native coronary artery without angina pectoris 12/27/2019   Other specified personal risk factors, not elsewhere classified 10/28/2019   Acute MI, anterior wall ([place]) 09/03/2019   Acute on chronic combined systolic and diastolic CHF (congestive heart failure) ([place]) 09/03/2019   Unstable angina ([place]) 09/03/2019   Bilateral pseudophakia 13/07/6577   Chronic systolic heart failure ([place]) 08/31/2019   Anemia 06/21/2019   Localized edema 06/21/2019   Underweight 06/21/2019   Cough 10/12/2018   Peripheral vascular disease ([place]) 06/19/2018   Hyperlipidemia 06/16/2018   Frequency of micturition 05/20/2018   Scoliosis 05/15/2018   Cystitis 09/03/2017   Osteoporosis 07/03/2015   Allergic rhinitis 05/26/2015   Chronic obstructive pulmonary disease ([place]) 05/26/2015   Hearing loss 05/26/2015   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/2015   Chronic kidney disease due to hypertension 05/17/2015   Encounter for general adult medical examination without abnormal findings 05/17/2015   Proteinuria 05/17/2015   Fatigue 09/07/2013   Ventricular premature  depolarization 09/07/2013   History of breast cancer 09/29/2012   Intermediate stage nonexudative age-related macular degeneration of both eyes 02/20/2012   Right epiretinal membrane 02/20/2012   Macular degeneration 03/18/2011   Varicose veins of lower extremity 03/18/2011   Low back pain 12/19/2010   Malignant neoplasm of female breast ([place]) 03/27/2010   Headache 03/12/2010   Urinary incontinence 03/12/2010   Insomnia 09/07/2009   Anxiety disorder 07/03/2009   Bipolar disorder ([place]) 07/03/2009   Gastro-esophageal reflux disease without esophagitis 07/03/2009   Hypothyroidism 07/03/2009       Objective: General: Patient is awake, alert, and oriented x 3 and in no acute distress.  Integument: Skin is warm, dry and supple bilateral. Nails are tender, long, thickened and  dystrophic with subungual debris, consistent with onychomycosis, 1-5 bilateral. Great toenails are significantly thickened and incurvated.  Pain with direct pressure noted.  No signs of infection. No open lesions or preulcerative lesions present bilateral. Remaining integument unremarkable.  Vasculature:  Dorsalis Pedis pulse 1/4 bilateral. Posterior Tibial pulse  1/4 bilateral.  Capillary fill time <3 sec 1-5 bilateral. Positive hair growth to the level of the digits. Temperature gradient within normal limits. mild varicosities present bilateral. Minimal edema present bilateral.   Neurology: The patient has intact sensation measured with a 5.07/10g Semmes Weinstein Monofilament at all pedal sites bilateral . Vibratory sensation diminished bilateral with tuning fork. No Babinski sign present bilateral.   Musculoskeletal: hammertoe second left noted. Muscular strength 5/5 in all lower extremity muscular groups bilateral without pain on range of motion . No tenderness with calf compression bilateral.  Assessment and Plan:   ICD-10-CM   1. Pain due to  onychomycosis of nail  B35.1    M79.609         -Examined  patient. -Mechanically debrided all nails 1-5 bilateral using sterile nail nipper and filed with sterile dremel without incident  -Answered all patient questions -Patient to return  in 3 months for continued foot care.  -Patient advised to call the office if any problems or questions arise in the meantime.  Bronson Ing, DPM

## 2022-12-24 ENCOUNTER — Other Ambulatory Visit: Payer: Self-pay | Admitting: Cardiovascular Disease

## 2022-12-31 ENCOUNTER — Encounter: Payer: Self-pay | Admitting: Podiatry

## 2022-12-31 ENCOUNTER — Ambulatory Visit: Payer: Medicare Other | Admitting: Podiatry

## 2022-12-31 VITALS — BP 109/45

## 2022-12-31 DIAGNOSIS — M8580 Other specified disorders of bone density and structure, unspecified site: Secondary | ICD-10-CM | POA: Insufficient documentation

## 2022-12-31 DIAGNOSIS — N3281 Overactive bladder: Secondary | ICD-10-CM | POA: Insufficient documentation

## 2022-12-31 DIAGNOSIS — N393 Stress incontinence (female) (male): Secondary | ICD-10-CM | POA: Insufficient documentation

## 2022-12-31 DIAGNOSIS — B351 Tinea unguium: Secondary | ICD-10-CM | POA: Diagnosis not present

## 2022-12-31 DIAGNOSIS — B2790 Infectious mononucleosis, unspecified without complication: Secondary | ICD-10-CM | POA: Insufficient documentation

## 2022-12-31 DIAGNOSIS — M79609 Pain in unspecified limb: Secondary | ICD-10-CM | POA: Diagnosis not present

## 2022-12-31 DIAGNOSIS — N950 Postmenopausal bleeding: Secondary | ICD-10-CM | POA: Insufficient documentation

## 2022-12-31 NOTE — Progress Notes (Signed)
Subjective:  Patient ID: Shelly [name], female    DOB: 26-Mar-1941,  MRN: [id number]  Shelly [name] presents to clinic today for at risk foot care. Patient has h/o PAD and painful elongated mycotic toenails 1-5 bilaterally which are tender when wearing enclosed shoe gear. Pain is relieved with periodic professional debridement.  Chief Complaint  Patient presents with   Nail Problem    [facility] PCP-[facility] PCP VST-Fall 2023   New problem(s): None.   PCP is Shon Baton, MD.  Allergies  Allergen Reactions   Morphine And Related Nausea And Vomiting   Yellow Dyes (Non-Tartrazine) Itching, Rash and Other (See Comments)    SEVERE RASH THAT COVERED THE BACK (lasted 3 months)   Amlodipine Besylate     Other reaction(s): Unknown   Amoxicillin Diarrhea and Other (See Comments)    Fever spiked at close to 105 F Has patient had a PCN reaction causing immediate rash, facial/tongue/throat swelling, SOB or lightheadedness with hypotension: Yes Has patient had a PCN reaction causing severe rash involving mucus membranes or skin necrosis: Yes Has patient had a PCN reaction that required hospitalization Yes Has patient had a PCN reaction occurring within the last 10 years: No If all of the above answers are "NO", then may proceed with Cephalosporin use.     Eggs Or Egg-Derived Products Other (See Comments) and Swelling    Eyes, lips, and mouth became swollen- no shortness of breath, however  Other Reaction(s): flu vaccine allergy   Penicillins Diarrhea and Other (See Comments)    Febrile and diarrhea Has patient had a PCN reaction causing immediate rash, facial/tongue/throat swelling, SOB or lightheadedness with hypotension: Yes Has patient had a PCN reaction causing severe rash involving mucus membranes or skin necrosis: Yes Has patient had a PCN reaction that required hospitalization Yes Has patient had a PCN reaction occurring within the last 10 years: No If all of the above answers are "NO", then  may proceed with Cephalosporin use.    Pollen Extract Other (See Comments)    Itchy eyes, runny nose and congestion   Lamictal [Lamotrigine] Rash   Review of Systems: Negative except as noted in the HPI.  Objective:  Vitals:   12/31/22 1339  BP: (!) 109/45   Shelly [name] is a pleasant 82 y.o. female thin build in NAD. AAO x 3.  Vascular Examination: CFT <3 seconds b/l. DP/PT pulses faintly palpable b/l. Skin temperature gradient warm to warm b/l. No pain with calf compression. No ischemia or gangrene. No cyanosis or clubbing noted b/l. Mild varicosities b/l LE.   Neurological Examination: Sensation grossly intact b/l with 10 gram monofilament.Vibratory sensation diminished b/l.  Dermatological Examination: Pedal skin warm and supple b/l. Toenails 1-5 b/l thick, discolored, elongated with subungual debris and pain on dorsal palpation.  No open wounds b/l LE. No interdigital macerations noted b/l LE. No hyperkeratotic nor porokeratotic lesions present on today's visit.  Musculoskeletal Examination: Muscle strength 5/5 to b/l LE. Hammertoe(s) noted to the 2-5 bilaterally. Patient ambulates independent of any assistive aids.  Radiographs: None  Assessment/Plan: 1. Pain due to onychomycosis of nail     No orders of the defined types were placed in this encounter.   -Patient was evaluated and treated. All patient's and/or POA's questions/concerns answered on today's visit. -No new findings. No new orders. -Patient to continue soft, supportive shoe gear daily. -Toenails 1-5 b/l were debrided in length and girth with sterile nail nippers and dremel without iatrogenic bleeding.  -Patient/POA to  call should there be question/concern in the interim.   Return in about 3 months (around 04/01/2023).  Marzetta Board, DPM

## 2023-01-09 ENCOUNTER — Other Ambulatory Visit: Payer: Self-pay | Admitting: Cardiovascular Disease

## 2023-01-27 ENCOUNTER — Other Ambulatory Visit: Payer: Self-pay | Admitting: Cardiovascular Disease

## 2023-03-12 ENCOUNTER — Other Ambulatory Visit: Payer: Self-pay | Admitting: Vascular Surgery

## 2023-03-25 ENCOUNTER — Other Ambulatory Visit: Payer: Self-pay | Admitting: Cardiovascular Disease

## 2023-04-08 ENCOUNTER — Other Ambulatory Visit: Payer: Self-pay | Admitting: Cardiovascular Disease

## 2023-04-09 ENCOUNTER — Ambulatory Visit: Payer: Medicare Other | Admitting: Podiatry

## 2023-04-14 ENCOUNTER — Ambulatory Visit: Payer: Medicare Other | Admitting: Podiatry

## 2023-04-16 ENCOUNTER — Other Ambulatory Visit (HOSPITAL_COMMUNITY): Payer: Self-pay | Admitting: *Deleted

## 2023-04-17 ENCOUNTER — Encounter (HOSPITAL_COMMUNITY)
Admission: RE | Admit: 2023-04-17 | Discharge: 2023-04-17 | Disposition: A | Discharge status: Home / Self Care | Payer: Medicare Other | Source: Ambulatory Visit | Attending: Nephrology | Admitting: Nephrology

## 2023-04-17 DIAGNOSIS — N189 Chronic kidney disease, unspecified: Secondary | ICD-10-CM | POA: Insufficient documentation

## 2023-04-17 DIAGNOSIS — D631 Anemia in chronic kidney disease: Secondary | ICD-10-CM | POA: Insufficient documentation

## 2023-04-17 MED ORDER — SODIUM CHLORIDE 0.9 % IV SOLN
510.0000 mg | INTRAVENOUS | Status: DC
  Administered 2023-04-17: 510 mg via INTRAVENOUS
  Filled 2023-04-17: qty 510

## 2023-04-24 ENCOUNTER — Ambulatory Visit (HOSPITAL_COMMUNITY)
Admission: RE | Admit: 2023-04-24 | Discharge: 2023-04-24 | Disposition: A | Discharge status: Home / Self Care | Payer: Medicare Other | Source: Ambulatory Visit | Attending: Nephrology | Admitting: Nephrology

## 2023-04-24 DIAGNOSIS — D631 Anemia in chronic kidney disease: Secondary | ICD-10-CM | POA: Insufficient documentation

## 2023-04-24 DIAGNOSIS — N189 Chronic kidney disease, unspecified: Secondary | ICD-10-CM | POA: Diagnosis not present

## 2023-04-24 MED ORDER — SODIUM CHLORIDE 0.9 % IV SOLN
510.0000 mg | INTRAVENOUS | Status: DC
  Administered 2023-04-24: 510 mg via INTRAVENOUS
  Filled 2023-04-24: qty 510

## 2023-05-16 ENCOUNTER — Other Ambulatory Visit (HOSPITAL_COMMUNITY): Payer: Self-pay | Admitting: Registered Nurse

## 2023-05-16 ENCOUNTER — Ambulatory Visit (HOSPITAL_BASED_OUTPATIENT_CLINIC_OR_DEPARTMENT_OTHER)
Admission: RE | Admit: 2023-05-16 | Discharge: 2023-05-16 | Disposition: A | Discharge status: Home / Self Care | Payer: Medicare Other | Source: Ambulatory Visit | Attending: Registered Nurse | Admitting: Registered Nurse

## 2023-05-16 DIAGNOSIS — S0003XA Contusion of scalp, initial encounter: Secondary | ICD-10-CM

## 2023-05-16 DIAGNOSIS — W19XXXA Unspecified fall, initial encounter: Secondary | ICD-10-CM | POA: Insufficient documentation

## 2023-05-16 DIAGNOSIS — S0990XA Unspecified injury of head, initial encounter: Secondary | ICD-10-CM | POA: Insufficient documentation

## 2023-06-03 ENCOUNTER — Ambulatory Visit: Payer: Medicare Other | Admitting: Podiatry

## 2023-06-04 ENCOUNTER — Encounter: Payer: Self-pay | Admitting: Podiatry

## 2023-06-04 ENCOUNTER — Ambulatory Visit: Payer: Medicare Other | Admitting: Podiatry

## 2023-06-04 DIAGNOSIS — M79609 Pain in unspecified limb: Secondary | ICD-10-CM | POA: Diagnosis not present

## 2023-06-04 DIAGNOSIS — B351 Tinea unguium: Secondary | ICD-10-CM

## 2023-06-04 DIAGNOSIS — I739 Peripheral vascular disease, unspecified: Secondary | ICD-10-CM

## 2023-06-04 NOTE — Progress Notes (Signed)
This patient returns to my office for at risk foot care.  This patient requires this care by a professional since this patient will be at risk due to having diabetes and PVD.  This patient is unable to cut nails herself since the patient cannot reach her nails.These nails are painful walking and wearing shoes.  This patient presents for at risk foot care today.  General Appearance  Alert, conversant and in no acute stress.  Vascular  Dorsalis pedis and posterior tibial  pulses are  weakly palpable  bilaterally.  Capillary return is within normal limits  bilaterally. Temperature is within normal limits  bilaterally.  Neurologic  Senn-Weinstein monofilament wire test within normal limits  bilaterally. Muscle power within normal limits bilaterally.  Nails Thick disfigured discolored nails with subungual debris  from hallux to fifth toes bilaterally. No evidence of bacterial infection or drainage bilaterally.  Orthopedic  No limitations of motion  feet .  No crepitus or effusions noted.  No bony pathology or digital deformities noted.  Skin  normotropic skin with no porokeratosis noted bilaterally.  No signs of infections or ulcers noted.     Onychomycosis  Pain in right toes  Pain in left toes  Consent was obtained for treatment procedures.   Mechanical debridement of nails 1-5  bilaterally performed with a nail nipper.  Filed with dremel without incident.    Return office visit   3 months                   Told patient to return for periodic foot care and evaluation due to potential at risk complications.   Helane Gunther DPM

## 2023-06-23 ENCOUNTER — Encounter: Payer: Self-pay | Admitting: Cardiovascular Disease

## 2023-06-23 NOTE — Progress Notes (Unsigned)
Cardiology Office Note:    Date:  06/24/2023   ID:  Shelly [name], DOB 10-20-1941, MRN [id number]  PCP:  Shelly Corn, MD  Cardiologist:  [name]  Electrophysiologist:  None   Referring MD: [facility]ly Corn, MD   Chief Complaint  Patient presents with   Coronary Artery Disease        Congestive Heart Failure         Previous notes:    Shelly [name] is a 82 y.o. female with a hx of CHF. She was seen in the [name] yesterday  am and was scheduled for an appt with the DOD the following day .  Echo on Sept. 4 revealed EF of 30-35%.   Grade 1 diastolic dysfunction.  Moderate pulmonary HTN with PA pressure of 56 mmHg. Mild - mod MR   We were asked to see her by Dr. Timothy [name] for further evaluation of sudden onset of rapid HR .   [name]  Woke up , could not breath  Felt weak.   Called EMS.   EMS did an ECG but HR had slowed by that time .   Came back to [name] to urgent care. .  ECG showed marked TWI ,  Felt fine,  Did not get admitted.   Echo was ordered and showed EF of 30-35%.  , moderate hypokinesis of anterior wall  PA pressure of 56 mmHg.  Was stared on Coreg 3. 125 BID   Has had some tightness in her chest for most of this summer. Really has not felt well Has been getting tired early . Has tightness in her chest with walking .   Would typically resolve when she stopped to rest.    She is had several episodes at night where she has very fast and rapid heart rate irregularities.  These are associated with intense chest pain and lightheadedness.  Clinically these could be consistent with ventricular tachycardia.  December 08, 2019:  S[name] is seen today for follow-up visit.  When I saw her several months ago it appeared that she had had a recent anterior wall myocardial infarction and was still symptomatic.  She was in congestive heart failure.  We admitted her to the hospital and arrange for her to have a heart catheterization.   At heart cath she was found to have a diffusely diseased LAD with a moderate proximal stenosis and severe diffuse mid/distal disease.  The vessel was unfavorable for PCI.  She had mild nonobstructive disease in the right coronary artery and left circumflex artery and start on medical therapy.  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  The LV dysfunction seem to be out of proportion related to her degree of coronary artery disease.  Has improved on medical Rx.  No CP , ,   Breathing is good   Has cluadication of her right leg ( sees [name] )   06/13/2020: [name] is seen for follow-up visit.  She has a history of an anterior wall myocardial infarction.  She had Takotsubo syndrome and had congestive heart failure. LVEF was originally 30 to 35%.  On medical therapy her EF has normalized and is now 55 to 60%.  Has some lingering right hip and thigh pain  Worse if she sits for a long time   Feb. 18,  2022: [name] is seen today for follow up of her Takotsubo syndrome   LV functin has normalized  Has continue r leg pain .  Has seen [name] ( neurosurgery )   Nov. 28, 2022: [name] is seen for follow up of her CAD and Takotsubo syndrome. Cath in Sept. 2020 showed severe disease in the mid / distal LAD- not a good lesion for PCI.    LV  function has normalized.  She and her husband both had COVID in August. Has been trying to gain weight .  Breathing has been good  Lasix has been stopped  Is not sleeping well at night   She was taken off Losartan and Olmesartan  Is not exercising on a regular basis Still having some issues healing up from a stress fracture from last year   June 24, 2023 [name] is seen for follow up of her CAD, Takotsubo syndrome  Rare episodes of cp  Only last for a few seconds  Is exercising more - working with PT at [name]   BP is  up today  Will add low dose amlodipine 2.5  mg a day    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HCC)    with psychosis   Breast cancer (HCC) 03/2010   left breast s/p lumpectomy; adjuvant radiation; on adjuvant homrnal therapy   Chronic kidney disease    stage 3   COPD (chronic obstructive pulmonary disease) (HCC)    Hay fever    History of breast cancer 09/29/2012   Hypertension    Hypothyroidism    Macular degeneration    Migraine    MVP (mitral valve prolapse)    Osteopenia    PONV (postoperative nausea and vomiting)    Right arm fracture    Sciatic pain    Scoliosis     Past Surgical History:  Procedure Laterality Date   APPENDECTOMY     BREAST BIOPSY     BREAST LUMPECTOMY     CATARACT EXTRACTION Right    COLONOSCOPY     DILATATION & CURETTAGE/HYSTEROSCOPY WITH MYOSURE N/A 05/24/2016   Procedure: DILATATION & CURETTAGE/HYSTEROSCOPY WITH MYOSURE;  Surgeon: [facility] Evans, MD;  Location: [name];  Service: Gynecology;  Laterality: N/A;  400 cc deficit   DILATION AND CURETTAGE OF UTERUS     uterine polyps   EYE SURGERY Right    retina   HERNIA REPAIR     LAPAROTOMY     LEFT HEART CATH AND CORONARY ANGIOGRAPHY N/A 09/07/2019   Procedure: LEFT HEART CATH AND CORONARY ANGIOGRAPHY;  Surgeon: [facility] Bollman, MD;  Location: [name];  Service: Cardiovascular;  Laterality: N/A;   right arm surgery     due to fracture   TONSILLECTOMY     TUBAL LIGATION     UNILATERAL SALPINGECTOMY Left    UPPER GI ENDOSCOPY     WISDOM TOOTH EXTRACTION      Current Medications: Current Meds  Medication Sig   amLODipine (NORVASC) 2.5 MG tablet Take 1 tablet (2.5 mg total) by mouth daily.   aspirin EC 81 MG tablet Take 81 mg by mouth daily.   atorvastatin (LIPITOR) 40 MG tablet TAKE ONE TABLET BY MOUTH ONCE DAILY   buPROPion (WELLBUTRIN XL) 150 MG 24 hr tablet Take 150 mg by mouth every morning.    carvedilol (COREG)  12.5 MG tablet TAKE ONE TABLET TWICE DAILY WITH  A MEAL   cetirizine (ZYRTEC) 10 MG tablet Take 10 mg by mouth daily.   Cholecalciferol (VITAMIN D-3) 25 MCG (1000 UT) CAPS Take 1,000 Units by mouth daily.   fluocinolone (VANOS) 0.01 % cream Apply topically 2 (two) times daily.   isosorbide mononitrate (IMDUR) 30 MG 24 hr tablet TAKE ONE TABLET EACH DAY   Multiple Vitamins-Minerals (PRESERVISION AREDS PO) Take by mouth.   SYNTHROID 50 MCG tablet Take 50 mcg by mouth daily before breakfast. Take one tablet (50 mcg)  by mouth Saturday and Sunday before breakfast. Take 1.5 tablets (75 mcg) M-F.   TRIAMCINOLONE ACETONIDE EX Apply topically. 2 times per day     Allergies:   Morphine and codeine, Yellow dyes (non-tartrazine), Amlodipine besylate, Amoxicillin, Egg-derived products, Penicillins, Pollen extract, and Lamictal [lamotrigine]   Social History   Socioeconomic History   Marital status: Married    Spouse name: Not on file   Number of children: Not on file   Years of education: Not on file   Highest education level: Not on file  Occupational History   Not on file  Tobacco Use   Smoking status: Former    Packs/day: 0.50    Years: 25.00    Additional pack years: 0.00    Total pack years: 12.50    Types: Cigarettes    Quit date: 08/07/1985    Years since quitting: 37.9    Passive exposure: Never   Smokeless tobacco: Never  Vaping Use   Vaping Use: Never used  Substance and Sexual Activity   Alcohol use: No    Alcohol/week: 1.0 standard drink of alcohol    Types: 1 Glasses of wine per week   Drug use: No   Sexual activity: Yes    Birth control/protection: Post-menopausal  Other Topics Concern   Not on file  Social History Narrative   Not on file   Social Determinants of Health   Financial Resource Strain: Low Risk  (09/04/2019)   Overall Financial Resource Strain (CARDIA)    Difficulty of Paying Living Expenses: Not hard at all  Food Insecurity: No Food Insecurity  (09/04/2019)   Hunger Vital Sign    Worried About Running Out of Food in the Last Year: Never true    Ran Out of Food in the Last Year: Never true  Transportation Needs: No Transportation Needs (09/04/2019)   PRAPARE - Administrator, Civil Service (Medical): No    Lack of Transportation (Non-Medical): No  Physical Activity: Sufficiently Active (09/04/2019)   Exercise Vital Sign    Days of Exercise per Week: 5 days    Minutes of Exercise per Session: 30 min  Stress: No Stress Concern Present (09/04/2019)   [employer] of Occupational Health - Occupational Stress Questionnaire    Feeling of Stress : Only a little  Social Connections: Moderately Integrated (09/04/2019)   Social Connection and Isolation Panel [NHANES]    Frequency of Communication with [place] and Family: More than three times a week    Frequency of Social Gatherings with [place] and Family: Three times a week    Attends Religious Services: More than 4 times per year    Active Member of Clubs or Organizations: No    Attends Banker Meetings: Never    Marital Status: Married     Family History: The patient's family history is not on file.  ROS:   Please see the history of present illness.     All other systems reviewed  and are negative.  EKGs/Labs/Other Studies Reviewed:    The following studies were reviewed today:    Recent Labs: No results found for requested labs within last 365 days.  Recent Lipid Panel    Component Value Date/Time   CHOL 148 11/19/2021 1645   TRIG 142 11/19/2021 1645   HDL 57 11/19/2021 1645   CHOLHDL 2.6 11/19/2021 1645   CHOLHDL 2.1 09/05/2019 0011   VLDL 11 09/05/2019 0011   LDLCALC 67 11/19/2021 1645    Physical Exam:    Physical Exam: Blood pressure (!) 160/60, pulse (!) 57, height 5' 2.5" (1.588 m), weight 93 lb 9.6 oz (42.5 kg), SpO2 98 %.  HYPERTENSION CONTROL Vitals:   06/24/23 1402 06/24/23 1418  BP: (!) 152/60 (!) 160/60    The  patient's blood pressure is elevated above target today.  In order to address the patient's elevated BP: A new medication was prescribed today.       ZOX:WRUE, elderly female  in no acute distress HEENT: Normal NECK: No JVD; No carotid bruits LYMPHATICS: No lymphadenopathy CARDIAC: RRR , soft systolic murmur radiating out to the Left ax line  RESPIRATORY:  Clear to auscultation without rales, wheezing or rhonchi  ABDOMEN: Soft, non-tender, non-distended MUSCULOSKELETAL:  No edema; No deformity  SKIN: Warm and dry NEUROLOGIC:  Alert and oriented x 3    EKG Interpretation Date/Time:  Tuesday June 24 2023 14:08:09 EDT Ventricular Rate:  57 PR Interval:  230 QRS Duration:  84 QT Interval:  486 QTC Calculation: 473 R Axis:   74  Text Interpretation: Sinus bradycardia with marked sinus arrhythmia with 1st degree A-V block Left ventricular hypertrophy with repolarization abnormality ( Sokolow-Lyon , Romhilt-Estes ) When compared with ECG of 06-Sep-2021 18:01, PREVIOUS ECG IS PRESENT Confirmed by Kristeen Miss ([id number]) on 06/24/2023 2:10:22 PM     ASSESSMENT:    1. Coronary artery disease involving native coronary artery of native heart without angina pectoris      PLAN:       1.  Coronary artery disease:      mild, brief episodes of angina    2.  Takotsubo syndrome:  stable     3. HTN:    BP Is a bit elevated .  Add Amlodipine 2.5 mg a day  Continue to monitor HR and BP     4.  Chronic kidney disease: stable       Medication Adjustments/Labs and Tests Ordered: Current medicines are reviewed at length with the patient today.  Concerns regarding medicines are outlined above.  Orders Placed This Encounter  Procedures   EKG 12-Lead    Meds ordered this encounter  Medications   amLODipine (NORVASC) 2.5 MG tablet    Sig: Take 1 tablet (2.5 mg total) by mouth daily.    Dispense:  90 tablet    Refill:  3        Patient Instructions  Medication  Instructions:  START Amlodipine 2.5 mg once daily *If you need a refill on your cardiac medications before your next appointment, please call your pharmacy*   Follow-Up: At [name], you and your health needs are our priority.  As part of our continuing mission to provide you with exceptional heart care, we have created designated Provider Care Teams.  These Care Teams include your primary Cardiologist (physician) and Advanced Practice Providers (APPs -  Physician Assistants and Nurse Practitioners) who all work together to provide you with the care you need, when you need  it.  We recommend signing up for the patient portal called "MyChart".  Sign up information is provided on this After Visit Summary.  MyChart is used to connect with patients for Virtual Visits (Telemedicine).  Patients are able to view lab/test results, encounter notes, upcoming appointments, etc.  Non-urgent messages can be sent to your provider as well.   To learn more about what you can do with MyChart, go to [email or web address].    Your next appointment:   6 month(s)  Provider:   Robin Searing, NP, Jacolyn Reedy, PA-C, Eligha Bridegroom, NP, OR Tereso Newcomer, PA-C        Signed, Kristeen Miss, MD  06/24/2023 5:05 PM    [facility]

## 2023-06-24 ENCOUNTER — Ambulatory Visit: Payer: Medicare Other | Attending: Cardiovascular Disease | Admitting: Cardiovascular Disease

## 2023-06-24 ENCOUNTER — Encounter: Payer: Self-pay | Admitting: Cardiovascular Disease

## 2023-06-24 VITALS — BP 160/60 | HR 57 | Ht 62.5 in | Wt 93.6 lb

## 2023-06-24 DIAGNOSIS — I2510 Atherosclerotic heart disease of native coronary artery without angina pectoris: Secondary | ICD-10-CM | POA: Diagnosis not present

## 2023-06-24 MED ORDER — AMLODIPINE BESYLATE 2.5 MG PO TABS: 2.5000 mg | ORAL_TABLET | Freq: Every day | ORAL | 3 refills | Status: AC

## 2023-06-24 NOTE — Patient Instructions (Signed)
Medication Instructions:  START Amlodipine 2.5 mg once daily *If you need a refill on your cardiac medications before your next appointment, please call your pharmacy*   Follow-Up: At [facility], you and your health needs are our priority.  As part of our continuing mission to provide you with exceptional heart care, we have created designated Provider Care Teams.  These Care Teams include your primary Cardiologist (physician) and Advanced Practice Providers (APPs -  Physician Assistants and Nurse Practitioners) who all work together to provide you with the care you need, when you need it.  We recommend signing up for the patient portal called "MyChart".  Sign up information is provided on this After Visit Summary.  MyChart is used to connect with patients for Virtual Visits (Telemedicine).  Patients are able to view lab/test results, encounter notes, upcoming appointments, etc.  Non-urgent messages can be sent to your provider as well.   To learn more about what you can do with MyChart, go to [email or web address].    Your next appointment:   6 month(s)  Provider:   Robin Searing, NP, Jacolyn Reedy, PA-C, Eligha Bridegroom, NP, OR Tereso Newcomer, PA-C

## 2023-07-15 ENCOUNTER — Other Ambulatory Visit: Payer: Self-pay | Admitting: Cardiovascular Disease

## 2023-08-05 ENCOUNTER — Other Ambulatory Visit: Payer: Self-pay | Admitting: Cardiovascular Disease

## 2023-09-04 ENCOUNTER — Ambulatory Visit: Payer: Medicare Other | Admitting: Podiatry

## 2023-09-04 DIAGNOSIS — M79609 Pain in unspecified limb: Secondary | ICD-10-CM

## 2023-09-04 DIAGNOSIS — I739 Peripheral vascular disease, unspecified: Secondary | ICD-10-CM

## 2023-09-04 DIAGNOSIS — B351 Tinea unguium: Secondary | ICD-10-CM

## 2023-09-04 NOTE — Progress Notes (Signed)
This patient returns to my office for at risk foot care.  This patient requires this care by a professional since this patient will be at risk due to having diabetes and PVD.  This patient is unable to cut nails herself since the patient cannot reach her nails.These nails are painful walking and wearing shoes.  This patient presents for at risk foot care today.  General Appearance  Alert, conversant and in no acute stress.  Vascular  Dorsalis pedis and posterior tibial  pulses are  weakly palpable  bilaterally.  Capillary return is within normal limits  bilaterally. Temperature is within normal limits  bilaterally.  Neurologic  Senn-Weinstein monofilament wire test within normal limits  bilaterally. Muscle power within normal limits bilaterally.  Nails Thick disfigured discolored nails with subungual debris  from hallux to fifth toes bilaterally. No evidence of bacterial infection or drainage bilaterally.  Orthopedic  No limitations of motion  feet .  No crepitus or effusions noted.  No bony pathology or digital deformities noted.  Skin  normotropic skin with no porokeratosis noted bilaterally.  No signs of infections or ulcers noted.     Onychomycosis  Pain in right toes  Pain in left toes  Consent was obtained for treatment procedures.   Mechanical debridement of nails 1-5  bilaterally performed with a nail nipper.  Filed with dremel without incident.    Return office visit   3 months                   Told patient to return for periodic foot care and evaluation due to potential at risk complications.   Helane Gunther DPM

## 2023-10-24 ENCOUNTER — Other Ambulatory Visit: Payer: Self-pay | Admitting: Cardiovascular Disease

## 2023-11-11 ENCOUNTER — Encounter (HOSPITAL_COMMUNITY): Payer: Self-pay | Admitting: Emergency Medicine

## 2023-11-11 ENCOUNTER — Other Ambulatory Visit: Payer: Self-pay

## 2023-11-11 ENCOUNTER — Emergency Department (HOSPITAL_COMMUNITY)
Admission: EM | Admit: 2023-11-11 | Discharge: 2023-11-11 | Disposition: A | Discharge status: Home / Self Care | Payer: Medicare Other | Attending: Emergency Medicine | Admitting: Emergency Medicine

## 2023-11-11 ENCOUNTER — Emergency Department (HOSPITAL_COMMUNITY): Payer: Medicare Other

## 2023-11-11 DIAGNOSIS — R0789 Other chest pain: Secondary | ICD-10-CM | POA: Diagnosis present

## 2023-11-11 DIAGNOSIS — Z79899 Other long term (current) drug therapy: Secondary | ICD-10-CM | POA: Diagnosis not present

## 2023-11-11 DIAGNOSIS — Z7982 Long term (current) use of aspirin: Secondary | ICD-10-CM | POA: Insufficient documentation

## 2023-11-11 DIAGNOSIS — I509 Heart failure, unspecified: Secondary | ICD-10-CM | POA: Diagnosis not present

## 2023-11-11 DIAGNOSIS — N189 Chronic kidney disease, unspecified: Secondary | ICD-10-CM | POA: Diagnosis not present

## 2023-11-11 DIAGNOSIS — D649 Anemia, unspecified: Secondary | ICD-10-CM | POA: Diagnosis not present

## 2023-11-11 DIAGNOSIS — Z853 Personal history of malignant neoplasm of breast: Secondary | ICD-10-CM | POA: Insufficient documentation

## 2023-11-11 DIAGNOSIS — J449 Chronic obstructive pulmonary disease, unspecified: Secondary | ICD-10-CM | POA: Insufficient documentation

## 2023-11-11 LAB — CBC
HCT: 34 % — ABNORMAL LOW (ref 36.0–46.0)
Hemoglobin: 10.8 g/dL — ABNORMAL LOW (ref 12.0–15.0)
MCH: 30.4 pg (ref 26.0–34.0)
MCHC: 31.8 g/dL (ref 30.0–36.0)
MCV: 95.8 fL (ref 80.0–100.0)
Platelets: 168 10*3/uL (ref 150–400)
RBC: 3.55 MIL/uL — ABNORMAL LOW (ref 3.87–5.11)
RDW: 12.6 % (ref 11.5–15.5)
WBC: 5.6 10*3/uL (ref 4.0–10.5)
nRBC: 0 % (ref 0.0–0.2)

## 2023-11-11 LAB — BASIC METABOLIC PANEL
Anion gap: 11 (ref 5–15)
BUN: 43 mg/dL — ABNORMAL HIGH (ref 8–23)
CO2: 22 mmol/L (ref 22–32)
Calcium: 9.2 mg/dL (ref 8.9–10.3)
Chloride: 108 mmol/L (ref 98–111)
Creatinine, Ser: 2.12 mg/dL — ABNORMAL HIGH (ref 0.44–1.00)
GFR, Estimated: 23 mL/min — ABNORMAL LOW (ref >60)
Glucose, Bld: 97 mg/dL (ref 70–99)
Potassium: 4.5 mmol/L (ref 3.5–5.1)
Sodium: 141 mmol/L (ref 135–145)

## 2023-11-11 LAB — TROPONIN I (HIGH SENSITIVITY)
Troponin I (High Sensitivity): 19 ng/L — ABNORMAL HIGH (ref <18)
Troponin I (High Sensitivity): 21 ng/L — ABNORMAL HIGH (ref <18)

## 2023-11-11 MED ORDER — SODIUM CHLORIDE 0.9 % IV BOLUS
500.0000 mL | Freq: Once | INTRAVENOUS | Status: AC
  Administered 2023-11-11: 500 mL via INTRAVENOUS

## 2023-11-11 NOTE — ED Provider Notes (Signed)
[place] EMERGENCY DEPARTMENT AT [facility] Provider Note   CSN: [id number] Arrival date & time: 11/11/23  1225     History  Chief Complaint  Patient presents with   Chest Pain    [name] is a 82 y.o. female with past medical history significant for breast cancer, not currently undergoing treatment, CHF, angina, Takotsubo cardiomyopathy, bipolar disorder, CKD, previous CAD, COPD who presents concern for few episodes of sharp chest pain, the first was a couple of days ago, and the most recent was this morning when she woke up.  Patient denies any exertional chest pain.  She reports that she has had some elevated blood pressure and so had been taking an extra dose of amlodipine as advised by her cardiologist, but her kidney doctor had raised concern about her kidney function with any new blood pressure medications.  She denies any current chest pain, shortness of breath, nausea, vomiting, radiation of pain to arm or leg.   Chest Pain      Home Medications Prior to Admission medications   Medication Sig Start Date End Date Taking? Authorizing Provider  amLODipine (NORVASC) 2.5 MG tablet Take 1 tablet (2.5 mg total) by mouth daily. 06/24/23 09/22/23  Nahser, Deloris Ping, MD  aspirin EC 81 MG tablet Take 81 mg by mouth daily.    [provider]  atorvastatin (LIPITOR) 40 MG tablet TAKE ONE TABLET BY MOUTH ONCE DAILY 03/01/22   Maeola Harman, MD  Biotin 1 MG CAPS  04/22/21   [provider]  buPROPion (WELLBUTRIN XL) 150 MG 24 hr tablet Take 150 mg by mouth every morning.  06/16/18   [provider]  carvedilol (COREG) 12.5 MG tablet TAKE ONE TABLET TWICE DAILY WITH A MEAL 08/06/23   Nahser, Deloris Ping, MD  cetirizine (ZYRTEC) 10 MG tablet Take 10 mg by mouth daily.    [provider]  Cholecalciferol (VITAMIN D-3) 25 MCG (1000 UT) CAPS Take 1,000 Units by mouth daily.    [provider]  fluocinolone (VANOS) 0.01 % cream  Apply topically 2 (two) times daily. 03/28/21   [provider]  furosemide (LASIX) 20 MG tablet Take 20 mg by mouth daily.    [provider]  hydrALAZINE (APRESOLINE) 25 MG tablet TAKE ONE AND ONE-HALF TABLET TWICE DAILY 03/25/23   Nahser, Deloris Ping, MD  isosorbide mononitrate (IMDUR) 30 MG 24 hr tablet TAKE ONE TABLET EACH DAY 10/24/23   Nahser, Deloris Ping, MD  Multiple Vitamins-Minerals (PRESERVISION AREDS PO) Take by mouth.    [provider]  sodium bicarbonate 650 MG tablet Take 650 mg by mouth 2 (two) times daily.    [provider]  SYNTHROID 50 MCG tablet Take 50 mcg by mouth daily before breakfast. Take one tablet (50 mcg)  by mouth Saturday and Sunday before breakfast. Take 1.5 tablets (75 mcg) M-F. 09/07/19   [provider]  traZODone (DESYREL) 50 MG tablet Take 25 mg by mouth.    [provider]  TRIAMCINOLONE ACETONIDE EX Apply topically. 2 times per day    [provider]  losartan (COZAAR) 25 MG tablet Take 25 mg by mouth daily. 04/17/16 06/13/20  [provider]  olmesartan (BENICAR) 20 MG tablet  04/27/18 09/03/19  [provider]      Allergies    Morphine and codeine, Yellow dyes (non-tartrazine), Amlodipine besylate, Amoxicillin, Egg-derived products, Penicillins, Pollen extract, and Lamictal [lamotrigine]    Review of Systems   Review  of Systems  Cardiovascular:  Positive for chest pain.  All other systems reviewed and are negative.   Physical Exam Updated Vital Signs BP (!) 141/67 (BP Location: Right Arm)   Pulse 65   Temp (!) 97.2 F (36.2 C) (Oral)   Resp 16   SpO2 100%  Physical Exam Vitals and nursing note reviewed.  Constitutional:      General: She is not in acute distress.    Appearance: Normal appearance.  HENT:     Head: Normocephalic and atraumatic.  Eyes:     General:        Right eye: No discharge.        Left eye: No discharge.  Cardiovascular:     Rate and Rhythm: Normal  rate and regular rhythm.  Pulmonary:     Effort: Pulmonary effort is normal. No respiratory distress.  Musculoskeletal:        General: No deformity.  Skin:    General: Skin is warm and dry.  Neurological:     Mental Status: She is alert and oriented to person, place, and time.  Psychiatric:        Mood and Affect: Mood normal.        Behavior: Behavior normal.     ED Results / Procedures / Treatments   Labs (all labs ordered are listed, but only abnormal results are displayed) Labs Reviewed  BASIC METABOLIC PANEL - Abnormal; Notable for the following components:      Result Value   BUN 43 (*)    Creatinine, Ser 2.12 (*)    GFR, Estimated 23 (*)    All other components within normal limits  CBC - Abnormal; Notable for the following components:   RBC 3.55 (*)    Hemoglobin 10.8 (*)    HCT 34.0 (*)    All other components within normal limits  TROPONIN I (HIGH SENSITIVITY) - Abnormal; Notable for the following components:   Troponin I (High Sensitivity) 19 (*)    All other components within normal limits  TROPONIN I (HIGH SENSITIVITY) - Abnormal; Notable for the following components:   Troponin I (High Sensitivity) 21 (*)    All other components within normal limits  URINALYSIS, ROUTINE W REFLEX MICROSCOPIC    EKG EKG Interpretation Date/Time:  Tuesday November 11 2023 12:35:18 EST Ventricular Rate:  60 PR Interval:  232 QRS Duration:  84 QT Interval:  444 QTC Calculation: 444 R Axis:   -66  Text Interpretation: Sinus rhythm with marked sinus arrhythmia with 1st degree A-V block Left axis deviation Left ventricular hypertrophy with repolarization abnormality ( Sokolow-Lyon , Romhilt-Estes ) Abnormal ECG Confirmed by Vonita Moss [phone]) on 11/11/2023 12:59:39 PM  Radiology DG Chest 2 View  Result Date: 11/11/2023 CLINICAL DATA:  Chest pain. EXAM: CHEST - 2 VIEW COMPARISON:  09/06/2021. FINDINGS: Bilateral lungs appear hyperexpanded with coarse bronchovascular  markings, concerning for COPD. Bilateral lungs otherwise appear clear. No dense consolidation or lung collapse. Bilateral costophrenic angles are clear. Stable cardio-mediastinal silhouette. No acute osseous abnormalities. The soft tissues are within normal limits. IMPRESSION: *No active cardiopulmonary disease.  Findings concerning for COPD. Electronically Signed   By: Jules Schick M.D.   On: 11/11/2023 16:50    Procedures Procedures    Medications Ordered in ED Medications  sodium chloride 0.9 % bolus 500 mL (500 mLs Intravenous New Bag/Given 11/11/23 1529)    ED Course/ Medical Decision Making/ A&P  Medical Decision Making Amount and/or Complexity of Data Reviewed Labs: ordered. Radiology: ordered.   This patient is a 82 y.o. female  who presents to the ED for concern of chest pain.   Differential diagnoses prior to evaluation: The emergent differential diagnosis includes, but is not limited to,  ACS, AAS, PE, Mallory-Weiss, Boerhaave's, Pneumonia, acute bronchitis, asthma or COPD exacerbation, anxiety, MSK pain or traumatic injury to the chest, acid reflux versus other . This is not an exhaustive differential.   Past Medical History / Co-morbidities / Social History: breast cancer, not currently undergoing treatment, CHF, angina, Takotsubo cardiomyopathy, bipolar disorder, CKD, previous CAD, COPD  Additional history: Chart reviewed. Pertinent results include: Reviewed lab work, imaging from previous emergency department visits, recent cardiology visits including heart cath in 2020 showing diffuse LAD disease with no plan for stent at that time  Physical Exam: Physical exam performed. The pertinent findings include: Well-appearing, overall stable vital signs, mildly hypertensive at time of discharge, blood pressure 141/67.    Lab Tests/Imaging studies: I personally interpreted labs/imaging and the pertinent results include:  CBC with mild  anemia, hemoglobin 10.8, BMP with mild worsening of her chronic kidney disease, BUN 43, creatinine 2.12, not greater than 1.5 times her recent baseline, we will give small fluid bolus and encourage oral hydration.  Initial troponin was 19, with delta at 21, likely impaired clearance secondary to her CKD..  I independently interpreted plain film chest x-ray shows no evidence of acute intrathoracic abnormality, signs of longstanding COPD I agree with the radiologist interpretation.  Cardiac monitoring: EKG obtained and interpreted by myself and attending physician which shows: Sinus rhythm with sinus arrhythmia, LVH   Medications: I ordered medication including small fluid bolus for elevated creatinine.  I have reviewed the patients home medicines and have made adjustments as needed.   Consults: I spoke with the cardiologist, Dr. Tenny Craw, who after evaluation of patient's lab work, imaging, and clinical presentation today feels comfortable with her to follow-up in the office, with no need for acute hospital admission today.  Disposition: After consideration of the diagnostic results and the patients response to treatment, I feel that patient stable for discharge with plan as above for close cardiology follow up.   emergency department workup does not suggest an emergent condition requiring admission or immediate intervention beyond what has been performed at this time. The plan is: as above. The patient is safe for discharge and has been instructed to return immediately for worsening symptoms, change in symptoms or any other concerns.  Final Clinical Impression(s) / ED Diagnoses Final diagnoses:  Atypical chest pain    Rx / DC Orders ED Discharge Orders     None         Olene Floss, PA-C 11/11/23 1718    Rondel Baton, MD 11/15/23 [phone]

## 2023-11-11 NOTE — ED Triage Notes (Signed)
[name] with reports of chest pain. Pt was given 1 nitroglycerin with relief. Pt also was given 324 ASA.

## 2023-11-11 NOTE — ED Notes (Signed)
[facility] daughter

## 2023-11-11 NOTE — ED Provider Triage Note (Signed)
Emergency Medicine Provider Triage Evaluation Note  CARINGTON FOY , a 82 y.o. female  was evaluated in triage.  Pt complains of chest pain for the past few days. Was nocturnal but persisted during the day today. Given aspirin and nitroglycerin by ems and now CP has resolved.  Review of Systems  Positive: Chest pain Negative: SOB  Physical Exam  BP 105/63 (BP Location: Right Arm)   Pulse 65   Temp 97.9 F (36.6 C) (Oral)   Resp 16   SpO2 95%  Gen:   Awake, no distress   Resp:  Normal effort  MSK:   Moves extremities without difficulty  Other:    Medical Decision Making  Medically screening exam initiated at 12:57 PM.  Appropriate orders placed.  Chalmers Guest was informed that the remainder of the evaluation will be completed by another provider, this initial triage assessment does not replace that evaluation, and the importance of remaining in the ED until their evaluation is complete.   Rondel Baton, MD 11/11/23 [phone]

## 2023-11-11 NOTE — Discharge Instructions (Signed)
You should receive a call from the cardiologist within the next few days to confirm a follow-up appointment, they had wanted to see you sometime in the next 2 weeks for follow-up, please return to the emergency department if you begin having new or worsening chest pain, shortness of breath.

## 2023-11-15 ENCOUNTER — Encounter: Payer: Self-pay | Admitting: Cardiovascular Disease

## 2023-11-15 NOTE — Progress Notes (Signed)
 No show  This encounter was created in error - please disregard.

## 2023-11-17 ENCOUNTER — Encounter: Payer: Medicare Other | Admitting: Cardiovascular Disease

## 2023-11-18 ENCOUNTER — Encounter: Payer: Self-pay | Admitting: Cardiovascular Disease

## 2023-12-03 ENCOUNTER — Ambulatory Visit: Payer: Medicare Other | Admitting: Podiatry

## 2024-01-05 ENCOUNTER — Ambulatory Visit: Payer: Medicare Other | Admitting: Cardiovascular Disease

## 2024-01-15 ENCOUNTER — Encounter: Payer: Self-pay | Admitting: Cardiovascular Disease

## 2024-01-15 NOTE — Progress Notes (Signed)
Cardiology Office Note:    Date:  01/16/2024   ID:  Shelly [name], DOB [birth year], MRN [id number]  PCP:  Creola Corn, MD  Cardiologist:  Aizen Duval  Electrophysiologist:  None   Referring MD: Creola Corn, MD   Chief Complaint  Patient presents with   Coronary Artery Disease        takotsubo syndrome    Previous notes:    Shelly [name] is a 83 y.o. female with a hx of CHF. She was seen in the [facility] ER yesterday  am and was scheduled for an appt with the DOD the following day .  Echo on Sept. 4 revealed EF of 30-35%.   Grade 1 diastolic dysfunction.  Moderate pulmonary HTN with PA pressure of 56 mmHg. Mild - mod MR   We were asked to see her by Dr. Timothy Lasso for further evaluation of sudden onset of rapid HR .   [place]  Woke up , could not breath  Felt weak.   Called EMS.   EMS did an ECG but HR had slowed by that time .   Came back to [name] to urgent care. .  ECG showed marked TWI ,  Felt fine,  Did not get admitted.   Echo was ordered and showed EF of 30-35%.  , moderate hypokinesis of anterior wall  PA pressure of 56 mmHg.  Was stared on Coreg 3. 125 BID   Has had some tightness in her chest for most of this summer. Really has not felt well Has been getting tired early . Has tightness in her chest with walking .   Would typically resolve when she stopped to rest.    She is had several episodes at night where she has very fast and rapid heart rate irregularities.  These are associated with intense chest pain and lightheadedness.  Clinically these could be consistent with ventricular tachycardia.  December 08, 2019:  Shelly [name] is seen today for follow-up visit.  When I saw her several months ago it appeared that she had had a recent anterior wall myocardial infarction and was still symptomatic.  She was in congestive heart failure.  We admitted her to the hospital and arrange for her to have a heart catheterization.  At heart  cath she was found to have a diffusely diseased LAD with a moderate proximal stenosis and severe diffuse mid/distal disease.  The vessel was unfavorable for PCI.  She had mild nonobstructive disease in the right coronary artery and left circumflex artery and start on medical therapy.  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  The LV dysfunction seem to be out of proportion related to her degree of coronary artery disease.  Has improved on medical Rx.  No CP , ,   Breathing is good   Has cluadication of her right leg ( sees Dr. Fabienne Bruns )   06/13/2020: [name] is seen for follow-up visit.  She has a history of an anterior wall myocardial infarction.  She had Takotsubo syndrome and had congestive heart failure. LVEF was originally 30 to 35%.  On medical therapy her EF has normalized and is now 55 to 60%.  Has some lingering right hip and thigh pain  Worse if she sits for a long time   Feb. 18, 2022: Shelly [name] is seen today for  follow up of her Takotsubo syndrome   LV functin has normalized  Has continue r leg pain .  Has seen Dr. Yetta Barre ( neurosurgery )   Nov. 28, 2022: Shelly [name] is seen for follow up of her CAD and Takotsubo syndrome. Cath in Sept. 2020 showed severe disease in the mid / distal LAD- not a good lesion for PCI.    LV  function has normalized.  She and her husband both had COVID in [name]. Has been trying to gain weight .  Breathing has been good  Lasix has been stopped  Is not sleeping well at night   She was taken off Losartan and Olmesartan  Is not exercising on a regular basis Still having some issues healing up from a stress fracture from last year   June 24, 2023 Shelly [name] is seen for follow up of her CAD, Takotsubo syndrome  Rare episodes of cp  Only last for a few seconds  Is exercising more - working with PT at [facility]   BP is up today   Will add low dose amlodipine 2.5  mg a day    Nov. 25, 2024 No show  Jan. 24, 2025 Shelly [name] is seen for follow up of her CAD, Takotsubo syndrome  Her husband [name]) recently passed away from a small bowel obstruction   She was having some angina when I saw her at her last visit and I added amlodipine 2.5 mg a day  When she saw Dr. Garnetta Buddy,  who recommended at that she only take the amlodipine if her SBP is over 130   She started having more angina,  Went to the ER  She restarted the amlodipine on her own and her angina is better   Will add SL NTG PRN  BP looks good today  Will continue amlodipine      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HCC)    with psychosis   Breast cancer (HCC) 03/2010   left breast s/p lumpectomy; adjuvant radiation; on adjuvant homrnal therapy   Chronic kidney disease    stage 3   COPD (chronic obstructive pulmonary disease) (HCC)    Hay fever    History of breast cancer 09/29/2012   Hypertension    Hypothyroidism    Macular degeneration    Migraine    MVP (mitral valve prolapse)    Osteopenia    PONV (postoperative nausea and vomiting)    Right arm fracture    Sciatic pain    Scoliosis     Past Surgical History:  Procedure Laterality Date   APPENDECTOMY     BREAST BIOPSY     BREAST LUMPECTOMY     CATARACT EXTRACTION Right    COLONOSCOPY     DILATATION & CURETTAGE/HYSTEROSCOPY WITH MYOSURE N/A 05/24/2016   Procedure: DILATATION & CURETTAGE/HYSTEROSCOPY WITH MYOSURE;  Surgeon: Shea Evans, MD;  Location: [facility];  Service: Gynecology;  Laterality: N/A;  400 cc deficit   DILATION AND CURETTAGE OF UTERUS     uterine polyps   EYE SURGERY Right    retina   HERNIA REPAIR     LAPAROTOMY     LEFT HEART CATH AND CORONARY ANGIOGRAPHY N/A 09/07/2019   Procedure: LEFT HEART CATH AND CORONARY ANGIOGRAPHY;  Surgeon: Tonny Bollman, MD;  Location: [facility] INVASIVE CV LAB;   Service: Cardiovascular;  Laterality: N/A;   right arm surgery  due to fracture   TONSILLECTOMY     TUBAL LIGATION     UNILATERAL SALPINGECTOMY Left    UPPER GI ENDOSCOPY     WISDOM TOOTH EXTRACTION      Current Medications: No outpatient medications have been marked as taking for the 01/16/24 encounter (Office Visit) with Annastasia Haskins, Deloris Ping, MD.     Allergies:   Morphine and codeine, Yellow dyes (non-tartrazine), Amlodipine besylate, Amoxicillin, Egg-derived products, Penicillins, Pollen extract, and Lamictal [lamotrigine]   Social History   Socioeconomic History   Marital status: Married    Spouse name: Not on file   Number of children: Not on file   Years of education: Not on file   Highest education level: Not on file  Occupational History   Not on file  Tobacco Use   Smoking status: Former    Current packs/day: 0.00    Average packs/day: 0.5 packs/day for 25.0 years (12.5 ttl pk-yrs)    Types: Cigarettes    Start date: 08/07/1960    Quit date: 08/07/1985    Years since quitting: 38.4    Passive exposure: Never   Smokeless tobacco: Never  Vaping Use   Vaping status: Never Used  Substance and Sexual Activity   Alcohol use: No    Alcohol/week: 1.0 standard drink of alcohol    Types: 1 Glasses of wine per week   Drug use: No   Sexual activity: Yes    Birth control/protection: Post-menopausal  Other Topics Concern   Not on file  Social History Narrative   Not on file   Social Drivers of Health   Financial Resource Strain: Low Risk  (09/04/2019)   Overall Financial Resource Strain (CARDIA)    Difficulty of Paying Living Expenses: Not hard at all  Food Insecurity: No Food Insecurity (09/04/2019)   Hunger Vital Sign    Worried About Running Out of Food in the Last Year: Never true    Ran Out of Food in the Last Year: Never true  Transportation Needs: No Transportation Needs (09/04/2019)   PRAPARE - Administrator, Civil Service (Medical): No    Lack of  Transportation (Non-Medical): No  Physical Activity: Sufficiently Active (09/04/2019)   Exercise Vital Sign    Days of Exercise per Week: 5 days    Minutes of Exercise per Session: 30 min  Stress: No Stress Concern Present (09/04/2019)   [employer] of Occupational Health - Occupational Stress Questionnaire    Feeling of Stress : Only a little  Social Connections: Moderately Integrated (09/04/2019)   Social Connection and Isolation Panel [NHANES]    Frequency of Communication with Friends and Family: More than three times a week    Frequency of Social Gatherings with Friends and Family: Three times a week    Attends Religious Services: More than 4 times per year    Active Member of Clubs or Organizations: No    Attends Banker Meetings: Never    Marital Status: Married     Family History: The patient's family history is not on file.  ROS:   Please see the history of present illness.     All other systems reviewed and are negative.  EKGs/Labs/Other Studies Reviewed:    The following studies were reviewed today:    Recent Labs: 11/11/2023: BUN 43; Creatinine, Ser 2.12; Hemoglobin 10.8; Platelets 168; Potassium 4.5; Sodium 141  Recent Lipid Panel    Component Value Date/Time   CHOL 148 11/19/2021 1645   TRIG  142 11/19/2021 1645   HDL 57 11/19/2021 1645   CHOLHDL 2.6 11/19/2021 1645   CHOLHDL 2.1 09/05/2019 0011   VLDL 11 09/05/2019 0011   LDLCALC 67 11/19/2021 1645    Physical Exam:     Physical Exam: Blood pressure 132/68, pulse 73, height 5' (1.524 m), weight 97 lb 3.2 oz (44.1 kg), SpO2 99%.       GEN:  elderly , frail female  in no acute distress HEENT: Normal NECK: No JVD; No carotid bruits LYMPHATICS: No lymphadenopathy CARDIAC: RRR , soft systolic murmur  RESPIRATORY:  Clear to auscultation without rales, wheezing or rhonchi  ABDOMEN: Soft, non-tender, non-distended MUSCULOSKELETAL:  No edema; No deformity  SKIN: Warm and  dry NEUROLOGIC:  Alert and oriented x 3          ASSESSMENT:    1. Acute MI, anterior wall (HCC)   2. Acute on chronic combined systolic and diastolic CHF (congestive heart failure) (HCC)   3. Takotsubo cardiomyopathy   4. Unstable angina (HCC)       PLAN:       1.  Coronary artery disease:      She has a diffusely diseased left anterior descending artery.  She started having more angina.  This seems to be helped with the addition of amlodipine.  Will also give her nitroglycerin to take on an as-needed basis.   2.  Takotsubo syndrome:    She has moderately reduced LV function.  Unfortunately, she has chronic kidney disease which prohibits [place] from adding much in the way of guideline directed medical therapy.  Will continue current medications.   3. HTN:     Blood pressure is well-controlled at present on current medications.  Continue same.    4.  Chronic kidney disease:  she is followed by nephrology       Medication Adjustments/Labs and Tests Ordered: Current medicines are reviewed at length with the patient today.  Concerns regarding medicines are outlined above.  No orders of the defined types were placed in this encounter.   No orders of the defined types were placed in this encounter.       Patient Instructions  Medication Instructions:  Nitroglycerin Dissolve 1 tablet under the tongue every 5 minutes as needed for chest pain. Max of 3 doses, then 911.   *If you need a refill on your cardiac medications before your next appointment, please call your pharmacy*  Follow-Up: At [facility], you and your health needs are our priority.  As part of our continuing mission to provide you with exceptional heart care, we have created designated Provider Care Teams.  These Care Teams include your primary Cardiologist (physician) and Advanced Practice Providers (APPs -  Physician Assistants and Nurse Practitioners) who all work together to provide you  with the care you need, when you need it.  We recommend signing up for the patient portal called "MyChart".  Sign up information is provided on this After Visit Summary.  MyChart is used to connect with patients for Virtual Visits (Telemedicine).  Patients are able to view lab/test results, encounter notes, upcoming appointments, etc.  Non-urgent messages can be sent to your provider as well.   To learn more about what you can do with MyChart, go to [email or web address].    Your next appointment:   1 year(s)  Provider:   Karl Ito, MD  Other Instructions   1st Floor: - Lobby - Registration  - Pharmacy  - Lab - Cafe  2nd Floor: - PV Lab - Diagnostic Testing (echo, CT, nuclear med)  3rd Floor: - Vacant  4th Floor: - TCTS (cardiothoracic surgery) - AFib Clinic - Structural Heart Clinic - Vascular Surgery  - Vascular Ultrasound  5th Floor: - HeartCare Cardiology (general and EP) - Clinical Pharmacy for coumadin, hypertension, lipid, weight-loss medications, and med management appointments    Valet parking services will be available as well.          Signed, Kristeen Miss, MD  01/16/2024 11:08 AM     [facility]

## 2024-01-16 ENCOUNTER — Ambulatory Visit: Payer: Medicare Other | Attending: Cardiovascular Disease | Admitting: Cardiovascular Disease

## 2024-01-16 VITALS — BP 132/68 | HR 73 | Ht 60.0 in | Wt 97.2 lb

## 2024-01-16 DIAGNOSIS — I2109 ST elevation (STEMI) myocardial infarction involving other coronary artery of anterior wall: Secondary | ICD-10-CM | POA: Diagnosis not present

## 2024-01-16 DIAGNOSIS — I5181 Takotsubo syndrome: Secondary | ICD-10-CM | POA: Diagnosis not present

## 2024-01-16 DIAGNOSIS — I200 Unstable angina: Secondary | ICD-10-CM | POA: Diagnosis not present

## 2024-01-16 DIAGNOSIS — I5043 Acute on chronic combined systolic (congestive) and diastolic (congestive) heart failure: Secondary | ICD-10-CM | POA: Diagnosis not present

## 2024-01-16 MED ORDER — NITROGLYCERIN 0.4 MG SL SUBL: 0.4000 mg | SUBLINGUAL_TABLET | SUBLINGUAL | 12 refills | Status: AC | PRN

## 2024-01-16 NOTE — Patient Instructions (Addendum)
Medication Instructions:  Nitroglycerin Dissolve 1 tablet under the tongue every 5 minutes as needed for chest pain. Max of 3 doses, then 911.    *If you need a refill on your cardiac medications before your next appointment, please call your pharmacy*  Follow-Up: At [facility], you and your health needs are our priority.  As part of our continuing mission to provide you with exceptional heart care, we have created designated Provider Care Teams.  These Care Teams include your primary Cardiologist (physician) and Advanced Practice Providers (APPs -  Physician Assistants and Nurse Practitioners) who all work together to provide you with the care you need, when you need it.  We recommend signing up for the patient portal called "MyChart".  Sign up information is provided on this After Visit Summary.  MyChart is used to connect with patients for Virtual Visits (Telemedicine).  Patients are able to view lab/test results, encounter notes, upcoming appointments, etc.  Non-urgent messages can be sent to your provider as well.   To learn more about what you can do with MyChart, go to [email or web address].    Your next appointment:   1 year(s)  Provider:   Karl Ito, MD  Other Instructions   1st Floor: - Lobby - Registration  - Pharmacy  - Lab - Cafe  2nd Floor: - PV Lab - Diagnostic Testing (echo, CT, nuclear med)  3rd Floor: - Vacant  4th Floor: - TCTS (cardiothoracic surgery) - AFib Clinic - Structural Heart Clinic - Vascular Surgery  - Vascular Ultrasound  5th Floor: - HeartCare Cardiology (general and EP) - Clinical Pharmacy for coumadin, hypertension, lipid, weight-loss medications, and med management appointments    Valet parking services will be available as well.

## 2024-01-27 ENCOUNTER — Other Ambulatory Visit: Payer: Self-pay | Admitting: Cardiovascular Disease

## 2024-06-14 ENCOUNTER — Telehealth: Payer: Self-pay

## 2024-06-14 NOTE — Telephone Encounter (Signed)
 Returned pt's call to triage. She is wanting to f/u as she has worsening RLE pain and swelling that has been slowly worsening over the last couple of year since she broke that foot. She is not walking as much. She has been scheduled for ABI's and reflux study.

## 2024-06-18 ENCOUNTER — Other Ambulatory Visit: Payer: Self-pay | Admitting: Vascular Surgery

## 2024-06-18 DIAGNOSIS — M7989 Other specified soft tissue disorders: Secondary | ICD-10-CM

## 2024-07-05 ENCOUNTER — Ambulatory Visit (HOSPITAL_BASED_OUTPATIENT_CLINIC_OR_DEPARTMENT_OTHER)
Admission: RE | Admit: 2024-07-05 | Discharge: 2024-07-05 | Disposition: A | Discharge status: Home / Self Care | Source: Ambulatory Visit | Attending: Surgery | Admitting: Surgery

## 2024-07-05 ENCOUNTER — Ambulatory Visit: Attending: Surgery | Admitting: Physician Assistant

## 2024-07-05 ENCOUNTER — Ambulatory Visit (HOSPITAL_COMMUNITY)
Admission: RE | Admit: 2024-07-05 | Discharge: 2024-07-05 | Disposition: A | Discharge status: Home / Self Care | Source: Ambulatory Visit | Attending: Surgery | Admitting: Surgery

## 2024-07-05 VITALS — BP 142/60 | HR 60 | Temp 97.9°F | Ht 60.0 in | Wt 97.3 lb

## 2024-07-05 DIAGNOSIS — M7989 Other specified soft tissue disorders: Secondary | ICD-10-CM | POA: Insufficient documentation

## 2024-07-05 LAB — VAS US ABI WITH/WO TBI
Left ABI: 0.84
Right ABI: 0.52

## 2024-07-05 NOTE — Progress Notes (Signed)
 [facility]           OF [place]  History and Physical   Shelly [name] is a 83 y.o. female who has a hx of PAD and claudication sx in the right leg with known right SFA occlusion and leg swelling.  She was last seen on 05/09/2022 and she was very active and walking every day.  She was having some leg swelling and was managing this with compression and elevation.  She did not have hx of DVT, venous ulcerations, trauma or previous vascular interventions.  She was advised to continue with mild knee high compression.  Discussed staying active and using NSAID's for discomfort associated with edema.  She was scheduled to f/u as needed.   She called a couple of weeks ago and wanted to be seen for worsening RLE pain and swelling over the past year since she broke her foot.  She was not walking as much.  She was scheduled for studies.  Pt comes in today with c/o right leg swelling.  She has hx of breaking her toe that healed but still has some swelling.  She does wear her compression socks.  She states the swelling gets better after being in bed overnight.  She does not have any non healing wounds.  She does not have any rest pain or claudication.  She does have some back issues that may be contributing to the pain down the lateral aspect of her right lower leg.    She states that her dentist took an xray and she was found to have some mild calcifications in her carotid artery.  She did have a duplex about 4 years ago that revealed 1-39% bilateral carotid stenosis.  She is going to take the information to her PCP next month.  If he feels she needs referral, we will be glad to repeat study.   She does have hx of kidney disease.   The pt is on a statin for cholesterol management.  The pt is on a daily aspirin .   Other AC:  none The pt is on CCB, BB for hypertension.   The pt is not on medication for diabetes.   Tobacco hx:  former   Past Medical History:  Diagnosis  Date   Anxiety    Arthritis    right elbow   Bipolar depression (HCC)    with psychosis   Breast cancer (HCC) 03/2010   left breast s/p lumpectomy; adjuvant radiation; on adjuvant homrnal therapy   Chronic kidney disease    stage 3   COPD (chronic obstructive pulmonary disease) (HCC)    Hay fever    History of breast cancer 09/29/2012   Hypertension    Hypothyroidism    Macular degeneration    Migraine    MVP (mitral valve prolapse)    Osteopenia    PONV (postoperative nausea and vomiting)    Right arm fracture    Sciatic pain    Scoliosis     Past Surgical History:  Procedure Laterality Date   APPENDECTOMY     BREAST BIOPSY     BREAST LUMPECTOMY     CATARACT EXTRACTION Right    COLONOSCOPY     DILATATION & CURETTAGE/HYSTEROSCOPY WITH MYOSURE N/A 05/24/2016   Procedure: DILATATION & CURETTAGE/HYSTEROSCOPY WITH MYOSURE;  Surgeon: Robbi Render, MD;  Location: [facility];  Service: Gynecology;  Laterality: N/A;  400 cc deficit   DILATION AND CURETTAGE OF UTERUS  uterine polyps   EYE SURGERY Right    retina   HERNIA REPAIR     LAPAROTOMY     LEFT HEART CATH AND CORONARY ANGIOGRAPHY N/A 09/07/2019   Procedure: LEFT HEART CATH AND CORONARY ANGIOGRAPHY;  Surgeon: Wonda Sharper, MD;  Location: [facility] IN[facility];  Service: Cardiovascular;  Laterality: N/A;   right arm surgery     due to fracture   TONSILLECTOMY     TUBAL LIGATION     UNILATERAL SALPINGECTOMY Left    UPPER GI ENDOSCOPY     WISDOM TOOTH EXTRACTION      Social History   Socioeconomic History   Marital status: Married    Spouse name: Not on file   Number of children: Not on file   Years of education: Not on file   Highest education level: Not on file  Occupational History   Not on file  Tobacco Use   Smoking status: Former    Current packs/day: 0.00    Average packs/day: 0.5 packs/day for 25.0 years (12.5 ttl pk-yrs)    Types: Cigarettes    Start date: 08/07/1960    Quit date: 08/07/1985    Years  since quitting: 38.9    Passive exposure: Never   Smokeless tobacco: Never  Vaping Use   Vaping status: Never Used  Substance and Sexual Activity   Alcohol use: No    Alcohol/week: 1.0 standard drink of alcohol    Types: 1 Glasses of wine per week   Drug use: No   Sexual activity: Yes    Birth control/protection: Post-menopausal  Other Topics Concern   Not on file  Social History Narrative   Not on file   Social Drivers of Health   Financial Resource Strain: Low Risk  (09/04/2019)   Overall Financial Resource Strain (CARDIA)    Difficulty of Paying Living Expenses: Not hard at all  Food Insecurity: No Food Insecurity (09/04/2019)   Hunger Vital Sign    Worried About Running Out of Food in the Last Year: Never true    Ran Out of Food in the Last Year: Never true  Transportation Needs: No Transportation Needs (09/04/2019)   PRAPARE - Administrator, Civil Service (Medical): No    Lack of Transportation (Non-Medical): No  Physical Activity: Sufficiently Active (09/04/2019)   Exercise Vital Sign    Days of Exercise per Week: 5 days    Minutes of Exercise per Session: 30 min  Stress: No Stress Concern Present (09/04/2019)   [employer] of Occupational Health - Occupational Stress Questionnaire    Feeling of Stress : Only a little  Social Connections: Moderately Integrated (09/04/2019)   Social Connection and Isolation Panel    Frequency of Communication with Friends and Family: More than three times a week    Frequency of Social Gatherings with Friends and Family: Three times a week    Attends Religious Services: More than 4 times per year    Active Member of Clubs or Organizations: No    Attends Banker Meetings: Never    Marital Status: Married  Catering manager Violence: Not At Risk (09/04/2019)   Humiliation, Afraid, Rape, and Kick questionnaire    Fear of Current or Ex-Partner: No    Emotionally Abused: No    Physically Abused: No    Sexually  Abused: No     Current Outpatient Medications  Medication Sig Dispense Refill   amLODipine  (NORVASC ) 2.5 MG tablet Take 1 tablet (2.5 mg  total) by mouth daily. 90 tablet 3   aspirin  EC 81 MG tablet Take 81 mg by mouth daily.     atorvastatin  (LIPITOR) 40 MG tablet TAKE ONE TABLET BY MOUTH ONCE DAILY 30 tablet 12   Biotin 1 MG CAPS  (Patient not taking: Reported on 06/24/2023)     buPROPion  (WELLBUTRIN  XL) 150 MG 24 hr tablet Take 150 mg by mouth every morning.      carvedilol  (COREG ) 12.5 MG tablet TAKE ONE TABLET TWICE DAILY WITH A MEAL 180 tablet 3   cetirizine (ZYRTEC) 10 MG tablet Take 10 mg by mouth daily.     Cholecalciferol (VITAMIN D-3) 25 MCG (1000 UT) CAPS Take 1,000 Units by mouth daily.     isosorbide  mononitrate (IMDUR ) 30 MG 24 hr tablet TAKE ONE TABLET EACH DAY 90 tablet 3   Multiple Vitamins-Minerals (PRESERVISION AREDS PO) Take by mouth.     nitroGLYCERIN  (NITROSTAT ) 0.4 MG SL tablet Place 1 tablet (0.4 mg total) under the tongue every 5 (five) minutes as needed for chest pain. 25 tablet 12   SYNTHROID  50 MCG tablet Take 50 mcg by mouth daily before breakfast. Take one tablet (50 mcg)  by mouth Saturday and Sunday before breakfast. Take 1.5 tablets (75 mcg) M-F.     TRIAMCINOLONE ACETONIDE EX Apply topically. 2 times per day     No current facility-administered medications for this visit.    Allergies  Allergen Reactions   Morphine And Codeine Nausea And Vomiting   Yellow Dyes (Non-Tartrazine) Itching, Rash and Other (See Comments)    SEVERE RASH THAT COVERED THE BACK (lasted 3 months)   Amlodipine  Besylate     Other reaction(s): Unknown   Amoxicillin Diarrhea and Other (See Comments)    Fever spiked at close to 105 F Has patient had a PCN reaction causing immediate rash, facial/tongue/throat swelling, SOB or lightheadedness with hypotension: Yes Has patient had a PCN reaction causing severe rash involving mucus membranes or skin necrosis: Yes Has patient had a PCN  reaction that required hospitalization Yes Has patient had a PCN reaction occurring within the last 10 years: No If all of the above answers are NO, then may proceed with Cephalosporin use.     Egg-Derived Products Other (See Comments) and Swelling    Eyes, lips, and mouth became swollen- no shortness of breath, however  Other Reaction(s): flu vaccine allergy   Penicillins Diarrhea and Other (See Comments)    Febrile and diarrhea Has patient had a PCN reaction causing immediate rash, facial/tongue/throat swelling, SOB or lightheadedness with hypotension: Yes Has patient had a PCN reaction causing severe rash involving mucus membranes or skin necrosis: Yes Has patient had a PCN reaction that required hospitalization Yes Has patient had a PCN reaction occurring within the last 10 years: No If all of the above answers are NO, then may proceed with Cephalosporin use.    Pollen Extract Other (See Comments)    Itchy eyes, runny nose and congestion   Lamictal [Lamotrigine] Rash    REVIEW OF SYSTEMS:   [X]  denotes positive finding, [ ]  denotes negative finding Cardiac  Comments:  Chest pain or chest pressure:    Shortness of breath upon exertion:    Short of breath when lying flat:    Irregular heart rhythm:        Vascular    Pain in calf, thigh, or hip brought on by ambulation:    Pain in feet at night that wakes you up from your  sleep:     Blood clot in your veins:    Leg swelling:  x       Pulmonary    Oxygen at home:    Productive cough:     Wheezing:         Neurologic    Sudden weakness in arms or legs:     Sudden numbness in arms or legs:     Sudden onset of difficulty speaking or slurred speech:    Temporary loss of vision in one eye:     Problems with dizziness:         Gastrointestinal    Blood in stool:     Vomited blood:         Genitourinary    Burning when urinating:     Blood in urine:        Psychiatric    Major depression:         Hematologic     Bleeding problems:    Problems with blood clotting too easily:        Skin    Rashes or ulcers:        Constitutional    Fever or chills:      PHYSICAL EXAMINATION:  Today's Vitals   07/05/24 1416  BP: (!) 142/60  Pulse: 60  Temp: 97.9 F (36.6 C)  TempSrc: Temporal  SpO2: 96%  Weight: 97 lb 4.8 oz (44.1 kg)  Height: 5' (1.524 m)  PainSc: 0-No pain   Body mass index is 19 kg/m.   General:  WDWN in NAD; vital signs documented above Gait: Not observed HENT: WNL, normocephalic Pulmonary: normal non-labored breathing without wheezing Cardiac: regular HR; without carotid bruits Skin: without rashes Vascular Exam/Pulses:  Right Left  Radial 2+ (normal) 2+ (normal)  DP 1+ (weak) 2+ (normal)   Extremities: mild RLE edema; no ulcerations   Neurologic: A&O X 3;  moving all extremities equally Psychiatric:  The pt has Normal affect.   Non-Invasive Vascular Imaging:   Venous duplex on 07/05/2024: Venous Reflux Times  +--------------+---------+------+-----------+------------+--------+  RIGHT        Reflux NoRefluxReflux TimeDiameter cmsComments                          Yes                                   +--------------+---------+------+-----------+------------+--------+  CFV          no                                              +--------------+---------+------+-----------+------------+--------+  FV prox       no                                              +--------------+---------+------+-----------+------------+--------+  FV mid        no                                              +--------------+---------+------+-----------+------------+--------+  FV dist       no                                              +--------------+---------+------+-----------+------------+--------+  Popliteal    no                                              +--------------+---------+------+-----------+------------+--------+  GSV  at [facility]    no                            0.42              +--------------+---------+------+-----------+------------+--------+  GSV prox thighno                            0.43              +--------------+---------+------+-----------+------------+--------+  GSV mid thigh no                            0.16              +--------------+---------+------+-----------+------------+--------+  GSV dist thighno                            0.21              +--------------+---------+------+-----------+------------+--------+  GSV at knee   no                            0.17              +--------------+---------+------+-----------+------------+--------+  GSV prox calf no                            0.19              +--------------+---------+------+-----------+------------+--------+  GSV mid calf  no                            0.18              +--------------+---------+------+-----------+------------+--------+  GSV dist calf no                            0.10              +--------------+---------+------+-----------+------------+--------+  SSV at [facility]    no                            0.39              +--------------+---------+------+-----------+------------+--------+  SSV prox calf no                            0.37              +--------------+---------+------+-----------+------------+--------+  SSV mid calf  no  0.18              +--------------+---------+------+-----------+------------+--------+     +----+---------+------+-----------+------------+--------+  LEFTReflux NoRefluxReflux TimeDiameter cmsComments                Yes                                   +----+---------+------+-----------+------------+--------+  CFV no                                              +----+---------+------+-----------+------------+--------+   Summary:  Right:  - No evidence of deep vein  thrombosis seen in the right lower extremity,  from the common femoral through the popliteal veins.  - No evidence of superficial venous thrombosis in the right lower  extremity.  - There is no evidence of venous reflux seen in the right lower extremity.  - No evidence of superficial venous reflux seen in the right greater  saphenous vein.  - No evidence of superficial venous reflux seen in the right short  saphenous vein.   ABI/TBI 07/05/2024: Right:  0.52/0.44 toe pressure 66 Left:  0.84/0.60 toe pressure 90   Shelly [name] is a 83 y.o. female who presents with hx of PAD and claudication sx in the right leg with known right SFA occlusion and leg swelling.   -pt has palpable DP pedal pulses and brisk doppler biphasic doppler flow bilateral DP.   -in the right lower extremity, the pt does not have evidence of DVT.  Pt does not have venous reflux in the right leg.  -discussed with pt about continuing to wear her knee high compression daily.  Discussed putting them on before getting out of bed.  -discussed the importance of leg elevation and how to elevate properly - pt is advised to elevate their legs and a diagram is given to them to demonstrate for pt to lay flat on their back with knees elevated and slightly bent with their feet higher than their knees, which puts their feet higher than their heart for 15 minutes per day.  If pt cannot lay flat, advised to lay as flat as possible.  -pt is advised to continue as much walking as possible and avoid sitting or standing for long periods of time.  -discussed importance exercise and that water aerobics would also be beneficial.  -handout with recommendations given -discussed with her that if her PCP feels she needs carotid duplex, we will be glad to do that.  She had 1-39% bilateral ICA stenosis 4 years ago.  -pt will f/u as needed   Lucie Apt, [facility] [phone]  Clinic MD:  Serene

## 2024-08-19 ENCOUNTER — Telehealth: Payer: Self-pay | Admitting: Student in an Organized Health Care Education/Training Program

## 2024-08-19 MED ORDER — CARVEDILOL 12.5 MG PO TABS: 12.5000 mg | ORAL_TABLET | Freq: Two times a day (BID) | ORAL | 1 refills | Status: AC

## 2024-08-19 NOTE — Telephone Encounter (Signed)
 Pt's medication was sent to pt's pharmacy as requested. Confirmation received.

## 2024-08-19 NOTE — Telephone Encounter (Signed)
*  STAT* If patient is at the pharmacy, call can be transferred to refill team.   1. Which medications need to be refilled? (please list name of each medication and dose if known)   carvedilol  (COREG ) 12.5 MG tablet   2. Would you like to learn more about the convenience, safety, & potential cost savings by using the [facility]?   3. Are you open to using the [facility] (Type [facility]. ).  4. Which pharmacy/location (including street and city if local pharmacy) is medication to be sent to?  [facility] - [place], [place] - [address]   5. Do they need a 30 day or 90 day supply?  90 day  Patient stated she still has some medication.

## 2024-11-16 ENCOUNTER — Encounter: Payer: Self-pay | Admitting: Student in an Organized Health Care Education/Training Program

## 2025-01-20 ENCOUNTER — Ambulatory Visit: Admitting: Student in an Organized Health Care Education/Training Program

## 2025-02-23 ENCOUNTER — Ambulatory Visit: Admitting: Student in an Organized Health Care Education/Training Program
